# Patient Record
Sex: Male | Born: 1955
Health system: Southern US, Community
[De-identification: ages and names within clinical notes are randomized; demographics above are authoritative.]

## PROBLEM LIST (undated history)

## (undated) DIAGNOSIS — R002 Palpitations: Secondary | ICD-10-CM

## (undated) DIAGNOSIS — I2510 Atherosclerotic heart disease of native coronary artery without angina pectoris: Secondary | ICD-10-CM

## (undated) DIAGNOSIS — R1032 Left lower quadrant pain: Secondary | ICD-10-CM

## (undated) DIAGNOSIS — M545 Low back pain, unspecified: Secondary | ICD-10-CM

## (undated) DIAGNOSIS — M1990 Unspecified osteoarthritis, unspecified site: Secondary | ICD-10-CM

## (undated) DIAGNOSIS — Z9889 Other specified postprocedural states: Secondary | ICD-10-CM

## (undated) DIAGNOSIS — Z87442 Personal history of urinary calculi: Secondary | ICD-10-CM

## (undated) DIAGNOSIS — T7840XA Allergy, unspecified, initial encounter: Secondary | ICD-10-CM

## (undated) DIAGNOSIS — E7800 Pure hypercholesterolemia, unspecified: Secondary | ICD-10-CM

## (undated) DIAGNOSIS — R972 Elevated prostate specific antigen [PSA]: Secondary | ICD-10-CM

## (undated) DIAGNOSIS — N400 Enlarged prostate without lower urinary tract symptoms: Secondary | ICD-10-CM

## (undated) DIAGNOSIS — R112 Nausea with vomiting, unspecified: Secondary | ICD-10-CM

## (undated) HISTORY — DX: Elevated prostate specific antigen (PSA): R97.20

## (undated) HISTORY — PX: COLONOSCOPY: SHX174

## (undated) HISTORY — DX: Allergy, unspecified, initial encounter: T78.40XA

## (undated) HISTORY — DX: Pure hypercholesterolemia, unspecified: E78.00

## (undated) HISTORY — DX: Unspecified osteoarthritis, unspecified site: M19.90

## (undated) HISTORY — PX: KNEE ARTHROSCOPY: SUR90

## (undated) HISTORY — DX: Palpitations: R00.2

## (undated) HISTORY — DX: Left lower quadrant pain: R10.32

## (undated) HISTORY — DX: Low back pain, unspecified: M54.50

## (undated) HISTORY — DX: Atherosclerotic heart disease of native coronary artery without angina pectoris: I25.10

## (undated) HISTORY — DX: Low back pain: M54.5

---

## 2001-09-18 HISTORY — PX: RECTAL SURGERY: SHX760

## 2001-09-18 HISTORY — PX: LAMINECTOMY AND MICRODISCECTOMY LUMBAR SPINE: SHX1913

## 2002-02-01 ENCOUNTER — Observation Stay (HOSPITAL_COMMUNITY): Admission: AD | Admit: 2002-02-01 | Discharge: 2002-02-01 | Payer: Self-pay

## 2002-09-08 ENCOUNTER — Observation Stay (HOSPITAL_COMMUNITY): Admission: RE | Admit: 2002-09-08 | Discharge: 2002-09-09 | Payer: Self-pay | Admitting: Neurosurgery

## 2002-09-08 ENCOUNTER — Encounter: Payer: Self-pay | Admitting: Neurosurgery

## 2004-10-04 ENCOUNTER — Ambulatory Visit: Payer: Self-pay | Admitting: Pulmonary Disease

## 2005-10-12 ENCOUNTER — Ambulatory Visit: Payer: Self-pay | Admitting: Pulmonary Disease

## 2005-10-31 ENCOUNTER — Ambulatory Visit: Payer: Self-pay | Admitting: Pulmonary Disease

## 2006-12-06 ENCOUNTER — Ambulatory Visit: Payer: Self-pay | Admitting: Pulmonary Disease

## 2006-12-06 LAB — CONVERTED CEMR LAB
ALT: 27 units/L (ref 0–40)
AST: 26 units/L (ref 0–37)
Albumin: 4.1 g/dL (ref 3.5–5.2)
Alkaline Phosphatase: 98 units/L (ref 39–117)
BUN: 21 mg/dL (ref 6–23)
Basophils Absolute: 0 10*3/uL (ref 0.0–0.1)
Basophils Relative: 0.7 % (ref 0.0–1.0)
Bilirubin, Direct: 0.2 mg/dL (ref 0.0–0.3)
CO2: 31 meq/L (ref 19–32)
Calcium: 9.1 mg/dL (ref 8.4–10.5)
Chloride: 101 meq/L (ref 96–112)
Cholesterol: 152 mg/dL (ref 0–200)
Creatinine, Ser: 1.1 mg/dL (ref 0.4–1.5)
Eosinophils Absolute: 0.1 10*3/uL (ref 0.0–0.6)
Eosinophils Relative: 1.1 % (ref 0.0–5.0)
GFR calc Af Amer: 91 mL/min
GFR calc non Af Amer: 75 mL/min
Glucose, Bld: 104 mg/dL — ABNORMAL HIGH (ref 70–99)
HCT: 45.1 % (ref 39.0–52.0)
HDL: 39.6 mg/dL (ref >39.0)
Hemoglobin: 15.9 g/dL (ref 13.0–17.0)
LDL Cholesterol: 93 mg/dL (ref 0–99)
Lymphocytes Relative: 20.8 % (ref 12.0–46.0)
MCHC: 35.3 g/dL (ref 30.0–36.0)
MCV: 83.7 fL (ref 78.0–100.0)
Monocytes Absolute: 0.5 10*3/uL (ref 0.2–0.7)
Monocytes Relative: 10 % (ref 3.0–11.0)
Neutro Abs: 3.7 10*3/uL (ref 1.4–7.7)
Neutrophils Relative %: 67.4 % (ref 43.0–77.0)
PSA: 0.63 ng/mL (ref 0.10–4.00)
Platelets: 206 10*3/uL (ref 150–400)
Potassium: 4.3 meq/L (ref 3.5–5.1)
RBC: 5.39 M/uL (ref 4.22–5.81)
RDW: 12.8 % (ref 11.5–14.6)
Sodium: 137 meq/L (ref 135–145)
TSH: 1.97 microintl units/mL (ref 0.35–5.50)
Total Bilirubin: 1.1 mg/dL (ref 0.3–1.2)
Total CHOL/HDL Ratio: 3.8
Total Protein: 7 g/dL (ref 6.0–8.3)
Triglycerides: 99 mg/dL (ref 0–149)
VLDL: 20 mg/dL (ref 0–40)
WBC: 5.4 10*3/uL (ref 4.5–10.5)

## 2007-11-15 DIAGNOSIS — T7840XA Allergy, unspecified, initial encounter: Secondary | ICD-10-CM | POA: Insufficient documentation

## 2007-12-19 ENCOUNTER — Ambulatory Visit: Payer: Self-pay | Admitting: Pulmonary Disease

## 2007-12-19 DIAGNOSIS — R002 Palpitations: Secondary | ICD-10-CM | POA: Insufficient documentation

## 2007-12-19 DIAGNOSIS — M1990 Unspecified osteoarthritis, unspecified site: Secondary | ICD-10-CM | POA: Insufficient documentation

## 2007-12-19 DIAGNOSIS — M545 Low back pain, unspecified: Secondary | ICD-10-CM | POA: Insufficient documentation

## 2007-12-28 LAB — CONVERTED CEMR LAB
ALT: 34 units/L (ref 0–53)
AST: 24 units/L (ref 0–37)
Albumin: 4.1 g/dL (ref 3.5–5.2)
Alkaline Phosphatase: 83 units/L (ref 39–117)
BUN: 14 mg/dL (ref 6–23)
Bacteria, UA: NEGATIVE
Basophils Absolute: 0 10*3/uL (ref 0.0–0.1)
Basophils Relative: 0.3 % (ref 0.0–1.0)
Bilirubin Urine: NEGATIVE
Bilirubin, Direct: 0.2 mg/dL (ref 0.0–0.3)
CO2: 32 meq/L (ref 19–32)
Calcium: 9.6 mg/dL (ref 8.4–10.5)
Chloride: 100 meq/L (ref 96–112)
Cholesterol: 184 mg/dL (ref 0–200)
Creatinine, Ser: 1.1 mg/dL (ref 0.4–1.5)
Crystals: NEGATIVE
Direct LDL: 107.7 mg/dL
Eosinophils Absolute: 0.1 10*3/uL (ref 0.0–0.7)
Eosinophils Relative: 1.8 % (ref 0.0–5.0)
GFR calc Af Amer: 90 mL/min
GFR calc non Af Amer: 75 mL/min
Glucose, Bld: 109 mg/dL — ABNORMAL HIGH (ref 70–99)
HCT: 48.8 % (ref 39.0–52.0)
HDL: 37.7 mg/dL — ABNORMAL LOW (ref >39.0)
Hemoglobin, Urine: NEGATIVE
Hemoglobin: 16.4 g/dL (ref 13.0–17.0)
Ketones, ur: NEGATIVE mg/dL
Leukocytes, UA: NEGATIVE
Lymphocytes Relative: 18.6 % (ref 12.0–46.0)
MCHC: 33.6 g/dL (ref 30.0–36.0)
MCV: 85 fL (ref 78.0–100.0)
Monocytes Absolute: 0.5 10*3/uL (ref 0.1–1.0)
Monocytes Relative: 7.2 % (ref 3.0–12.0)
Neutro Abs: 4.5 10*3/uL (ref 1.4–7.7)
Neutrophils Relative %: 72.1 % (ref 43.0–77.0)
Nitrite: NEGATIVE
PSA: 0.65 ng/mL (ref 0.10–4.00)
Platelets: 206 10*3/uL (ref 150–400)
Potassium: 3.9 meq/L (ref 3.5–5.1)
RBC: 5.74 M/uL (ref 4.22–5.81)
RDW: 13 % (ref 11.5–14.6)
Sodium: 139 meq/L (ref 135–145)
Specific Gravity, Urine: 1.02 (ref 1.000–1.03)
Squamous Epithelial / HPF: NEGATIVE /lpf
TSH: 2.17 microintl units/mL (ref 0.35–5.50)
Total Bilirubin: 1.3 mg/dL — ABNORMAL HIGH (ref 0.3–1.2)
Total CHOL/HDL Ratio: 4.9
Total Protein, Urine: NEGATIVE mg/dL
Total Protein: 7.3 g/dL (ref 6.0–8.3)
Triglycerides: 245 mg/dL (ref 0–149)
Urine Glucose: NEGATIVE mg/dL
Urobilinogen, UA: 0.2 (ref 0.0–1.0)
VLDL: 49 mg/dL — ABNORMAL HIGH (ref 0–40)
WBC: 6.3 10*3/uL (ref 4.5–10.5)
pH: 5.5 (ref 5.0–8.0)

## 2008-06-24 ENCOUNTER — Telehealth: Payer: Self-pay | Admitting: Pulmonary Disease

## 2008-06-25 ENCOUNTER — Ambulatory Visit: Payer: Self-pay | Admitting: Pulmonary Disease

## 2008-08-26 ENCOUNTER — Telehealth: Payer: Self-pay | Admitting: Pulmonary Disease

## 2008-08-26 LAB — CONVERTED CEMR LAB
ALT: 24 units/L (ref 0–53)
AST: 22 units/L (ref 0–37)
Albumin: 3.8 g/dL (ref 3.5–5.2)
Alkaline Phosphatase: 91 units/L (ref 39–117)
Bilirubin, Direct: 0.2 mg/dL (ref 0.0–0.3)
Cholesterol: 146 mg/dL (ref 0–200)
HDL: 31.4 mg/dL — ABNORMAL LOW (ref >39.0)
LDL Cholesterol: 85 mg/dL (ref 0–99)
Total Bilirubin: 0.9 mg/dL (ref 0.3–1.2)
Total CHOL/HDL Ratio: 4.6
Total Protein: 6.8 g/dL (ref 6.0–8.3)
Triglycerides: 148 mg/dL (ref 0–149)
VLDL: 30 mg/dL (ref 0–40)

## 2008-12-07 ENCOUNTER — Ambulatory Visit: Payer: Self-pay | Admitting: Pulmonary Disease

## 2008-12-07 LAB — CONVERTED CEMR LAB
Bilirubin Urine: NEGATIVE
Hemoglobin, Urine: NEGATIVE
Ketones, ur: NEGATIVE mg/dL
Leukocytes, UA: NEGATIVE
Nitrite: NEGATIVE
Specific Gravity, Urine: 1.02 (ref 1.000–1.030)
Total Protein, Urine: NEGATIVE mg/dL
Urine Glucose: NEGATIVE mg/dL
Urobilinogen, UA: 0.2 (ref 0.0–1.0)
pH: 5.5 (ref 5.0–8.0)

## 2008-12-22 ENCOUNTER — Ambulatory Visit: Payer: Self-pay | Admitting: Pulmonary Disease

## 2008-12-22 DIAGNOSIS — M109 Gout, unspecified: Secondary | ICD-10-CM | POA: Insufficient documentation

## 2008-12-22 LAB — CONVERTED CEMR LAB
ALT: 33 units/L (ref 0–53)
AST: 26 units/L (ref 0–37)
Albumin: 3.9 g/dL (ref 3.5–5.2)
Alkaline Phosphatase: 75 units/L (ref 39–117)
BUN: 17 mg/dL (ref 6–23)
Basophils Absolute: 0.1 10*3/uL (ref 0.0–0.1)
Basophils Relative: 2.3 % (ref 0.0–3.0)
Bilirubin, Direct: 0.2 mg/dL (ref 0.0–0.3)
CO2: 31 meq/L (ref 19–32)
Calcium: 8.9 mg/dL (ref 8.4–10.5)
Chloride: 104 meq/L (ref 96–112)
Cholesterol: 145 mg/dL (ref 0–200)
Creatinine, Ser: 1.2 mg/dL (ref 0.4–1.5)
Eosinophils Absolute: 0.1 10*3/uL (ref 0.0–0.7)
Eosinophils Relative: 2.4 % (ref 0.0–5.0)
GFR calc non Af Amer: 67.29 mL/min (ref >60)
Glucose, Bld: 112 mg/dL — ABNORMAL HIGH (ref 70–99)
HCT: 45.4 % (ref 39.0–52.0)
HDL: 33.6 mg/dL — ABNORMAL LOW (ref >39.00)
Hemoglobin: 16.2 g/dL (ref 13.0–17.0)
LDL Cholesterol: 87 mg/dL (ref 0–99)
Lymphocytes Relative: 19.3 % (ref 12.0–46.0)
Lymphs Abs: 1.1 10*3/uL (ref 0.7–4.0)
MCHC: 35.6 g/dL (ref 30.0–36.0)
MCV: 84.8 fL (ref 78.0–100.0)
Monocytes Absolute: 0.5 10*3/uL (ref 0.1–1.0)
Monocytes Relative: 8.1 % (ref 3.0–12.0)
Neutro Abs: 3.8 10*3/uL (ref 1.4–7.7)
Neutrophils Relative %: 67.9 % (ref 43.0–77.0)
PSA: 0.71 ng/mL (ref 0.10–4.00)
Platelets: 155 10*3/uL (ref 150.0–400.0)
Potassium: 4.5 meq/L (ref 3.5–5.1)
RBC: 5.35 M/uL (ref 4.22–5.81)
RDW: 12.4 % (ref 11.5–14.6)
Sodium: 140 meq/L (ref 135–145)
TSH: 1.31 microintl units/mL (ref 0.35–5.50)
Total Bilirubin: 1.3 mg/dL — ABNORMAL HIGH (ref 0.3–1.2)
Total CHOL/HDL Ratio: 4
Total Protein: 6.4 g/dL (ref 6.0–8.3)
Triglycerides: 122 mg/dL (ref 0.0–149.0)
VLDL: 24.4 mg/dL (ref 0.0–40.0)
WBC: 5.6 10*3/uL (ref 4.5–10.5)

## 2008-12-23 LAB — CONVERTED CEMR LAB: Uric Acid, Serum: 8.5 mg/dL — ABNORMAL HIGH (ref 4.0–7.8)

## 2009-02-08 ENCOUNTER — Telehealth (INDEPENDENT_AMBULATORY_CARE_PROVIDER_SITE_OTHER): Payer: Self-pay | Admitting: *Deleted

## 2009-11-30 ENCOUNTER — Ambulatory Visit: Payer: Self-pay | Admitting: Pulmonary Disease

## 2009-11-30 DIAGNOSIS — R1032 Left lower quadrant pain: Secondary | ICD-10-CM | POA: Insufficient documentation

## 2009-12-01 ENCOUNTER — Ambulatory Visit: Payer: Self-pay | Admitting: Internal Medicine

## 2009-12-01 ENCOUNTER — Telehealth (INDEPENDENT_AMBULATORY_CARE_PROVIDER_SITE_OTHER): Payer: Self-pay | Admitting: *Deleted

## 2009-12-01 LAB — CONVERTED CEMR LAB
ALT: 23 units/L (ref 0–53)
AST: 22 units/L (ref 0–37)
Alkaline Phosphatase: 83 units/L (ref 39–117)
Basophils Absolute: 0 10*3/uL (ref 0.0–0.1)
Bilirubin, Direct: 0.1 mg/dL (ref 0.0–0.3)
CO2: 33 meq/L — ABNORMAL HIGH (ref 19–32)
Chloride: 106 meq/L (ref 96–112)
Creatinine, Ser: 1.1 mg/dL (ref 0.4–1.5)
Hemoglobin: 15.3 g/dL (ref 13.0–17.0)
Lymphocytes Relative: 23.7 % (ref 12.0–46.0)
Monocytes Relative: 9 % (ref 3.0–12.0)
Neutro Abs: 3 10*3/uL (ref 1.4–7.7)
Potassium: 4.2 meq/L (ref 3.5–5.1)
RBC: 5.17 M/uL (ref 4.22–5.81)
RDW: 13.1 % (ref 11.5–14.6)
Total Protein: 6.5 g/dL (ref 6.0–8.3)

## 2009-12-07 ENCOUNTER — Encounter: Payer: Self-pay | Admitting: Pulmonary Disease

## 2009-12-17 ENCOUNTER — Telehealth (INDEPENDENT_AMBULATORY_CARE_PROVIDER_SITE_OTHER): Payer: Self-pay | Admitting: *Deleted

## 2009-12-20 ENCOUNTER — Ambulatory Visit: Payer: Self-pay | Admitting: Pulmonary Disease

## 2009-12-22 ENCOUNTER — Ambulatory Visit: Payer: Self-pay | Admitting: Pulmonary Disease

## 2009-12-22 DIAGNOSIS — R972 Elevated prostate specific antigen [PSA]: Secondary | ICD-10-CM | POA: Insufficient documentation

## 2009-12-22 LAB — CONVERTED CEMR LAB
AST: 19 units/L (ref 0–37)
Alkaline Phosphatase: 89 units/L (ref 39–117)
BUN: 15 mg/dL (ref 6–23)
Basophils Absolute: 0 10*3/uL (ref 0.0–0.1)
Calcium: 9.4 mg/dL (ref 8.4–10.5)
Cholesterol: 128 mg/dL (ref 0–200)
Creatinine, Ser: 1.1 mg/dL (ref 0.4–1.5)
Eosinophils Absolute: 0.2 10*3/uL (ref 0.0–0.7)
GFR calc non Af Amer: 74.1 mL/min (ref >60)
Glucose, Bld: 96 mg/dL (ref 70–99)
Hemoglobin, Urine: NEGATIVE
Lymphocytes Relative: 20.6 % (ref 12.0–46.0)
MCHC: 34.8 g/dL (ref 30.0–36.0)
Monocytes Relative: 9 % (ref 3.0–12.0)
Neutrophils Relative %: 66.6 % (ref 43.0–77.0)
Nitrite: NEGATIVE
Platelets: 201 10*3/uL (ref 150.0–400.0)
RDW: 13.3 % (ref 11.5–14.6)
Sodium: 143 meq/L (ref 135–145)
Specific Gravity, Urine: 1.025 (ref 1.000–1.030)
Total Bilirubin: 0.6 mg/dL (ref 0.3–1.2)
Total CHOL/HDL Ratio: 4
Total Protein, Urine: NEGATIVE mg/dL
Triglycerides: 299 mg/dL — ABNORMAL HIGH (ref 0.0–149.0)
Urine Glucose: NEGATIVE mg/dL
Urobilinogen, UA: 0.2 (ref 0.0–1.0)
VLDL: 59.8 mg/dL — ABNORMAL HIGH (ref 0.0–40.0)

## 2010-01-19 ENCOUNTER — Telehealth (INDEPENDENT_AMBULATORY_CARE_PROVIDER_SITE_OTHER): Payer: Self-pay | Admitting: *Deleted

## 2010-01-20 ENCOUNTER — Ambulatory Visit: Payer: Self-pay | Admitting: Pulmonary Disease

## 2010-02-24 ENCOUNTER — Telehealth (INDEPENDENT_AMBULATORY_CARE_PROVIDER_SITE_OTHER): Payer: Self-pay | Admitting: *Deleted

## 2010-02-28 ENCOUNTER — Telehealth (INDEPENDENT_AMBULATORY_CARE_PROVIDER_SITE_OTHER): Payer: Self-pay | Admitting: *Deleted

## 2010-10-18 NOTE — Assessment & Plan Note (Signed)
Summary: CPX/ MBW   CC:  Follow up visit & yearly CPX....  History of Present Illness: 55 y/o WM here for a follow up visit & CPX...   ~  Apr10:  he has been doing well and has no new complaints or concerns today... he notes that he was dx w/ an early stage cataract by his optomitrist- DrMiller.... he was also seen at [facility] 2/10 w/ "gout"- he had pain in left heel area, not needled, lab work done and told Uric was elevated... given Colchicine (developed sl diarrhea), & Hydrocodone & symptoms resolved- no recurrence... we discussed re-checking Urate level (8.5) and consider Allopurinol (he declines & wants to manage w/ low purine diet)...   ~  November 30, 2009:  he states that he had a GI bug  ~1 month ago w/ n/v/d and some discomfort in the LLQ area... the n/v/d resolved spont, but the LLQ pain has persisited- a steady daily continuous dull pain- worse w/ cough, no relation to BM's which have been back to normal, no blood seen, no f/c/s etc... he denies bulging lump in groin- no hx hernia etc... he had prev rectal surg 2003 by [facility] & a neg screening colonoscopy 2008 by [facility] (min divertics only)...  **exam is neg x min discomfort on deep palp in LLQ, no hernia detected; we discussed eval w/ CT Abd&Pelvis (essentially neg), check labs (all WNL w/ sed=7), ?refer to GI (saw [facility] 3/11- prob musculoskeletal pain, ?gastroenteritis)...    ~  December 22, 2009:  GI symptoms diminished over the last month, back to  ~baseline... otherw feeling well- no new complaints or concerns x Insomnia & would like to try AMBIEN Prn...  he had labs done 12/20/09 and review shows incr PSA to 4.96 (it was 0.71 last yr)... he specif denies any prostate manipulation, dysuria, pain, LTOS, etc... **offered referral to Urology now, vs trial Antibiotic for several weeks & repeat PSA in 7mo (he prefers the latter)...    Current Problems:   ALLERGY (ICD-995.3) - uses OTC allergy meds as needed + "netti pot"... he had pos  testing by [facility] in 1999 w/ sens to pollen, trees, weeds... he has received Depo shots for severe spring allergy symptoms in the past.  Hx of PALPITATIONS (ICD-785.1) - cardiac eval by DrKatz in 2004 was neg.  ~  baseline EKG w/ NSR, WNL...   ~  NuclearStressTest 11/04 was neg for scar or ischemia, EF=56%...  HYPERCHOLESTEROLEMIA (ICD-272.0) - on ZOCOR 40mg - 1/2 tab daily... TRICOR was added to his regimen in 2007 but he stopped this on his own...   ~  FLP 3/08 on Simva20/ Tricor showed TChol 152, TG 99 (down from 444), HDL 40, LDL 93... same.  ~  FLP 4/09 on Simva20 showed TChol 184, TG 245, HDL 38, LDL 108... he didn't want to restart Fibrate.  ~  FLP 3/10 on Simva20 showed TChol 145, TG 122, HDL 34, LDL 87... improved.  ~  FLP 4/11 on Simva20 showed TChol 128, TG 299, HDL 34, LDL 54... needs better low fat diet.  ABDOMINAL PAIN, LEFT LOWER QUADRANT (ICD-789.04) - **see eval 3/11**  ELEVATED PROSTATE SPECIFIC ANTIGEN (ICD-790.93) - **see plan 4/11**  ~  labs 3/10 showed PSA= 0.71  ~  labs 4/11 showed PSA= 4.96 & DRE is benign- normal feeling gland... he opted for trial antibiotic Rx & repeat lab.  DEGENERATIVE JOINT DISEASE (ICD-715.90) - he is s/p arthroscopic surg both knees by DrWainer  GOUT (ICD-274.9) - **  see above ** Uric acid level 4/10 = 8.5 & pt was offered Allopurinol Rx, but he preferred to control this on a low purine diet... no recurrent acute arthritic problems or gout attacks so far...  Hx of BACK PAIN, LUMBAR (ICD-724.2) - he takes Advil OTC 3-4 Qod or so... he is s/p L4-5 lumbar laminotomy & microdiscectomy 2003 by DrNudelman...  HEALTH MAINTENANCE:    ~  GI = DrMedoff w/ flex sig 7/93 = neg; & colonoscopy 4/08 showing rare divertics, no polyps...   ~  GU = 4/11 OV w/ norm DRE & sudden rise in PSA to 4.96... trial Doxy & f/u PSA 47mo- may need bx.  ~  Immunizations:  ?last Tetanus shot;  he has not been inclined to get the seasonal flu vaccines.   Allergies: 1)  !  Penicillin  Comments:  Nurse/Medical Assistant: The patient's medications and allergies were reviewed with the patient and were updated in the Medication and Allergy Lists.  Past History:  Past Medical History:  ALLERGY (ICD-995.3) Hx of PALPITATIONS (ICD-785.1) HYPERCHOLESTEROLEMIA (ICD-272.0) ABDOMINAL PAIN, LEFT LOWER QUADRANT (ICD-789.04) ELEVATED PROSTATE SPECIFIC ANTIGEN (ICD-790.93) DEGENERATIVE JOINT DISEASE (ICD-715.90) GOUT (ICD-274.9) Hx of BACK PAIN, LUMBAR (ICD-724.2)  Past Surgical History: S/P right knee arthroscopy by DrWainer S/P right L4-5 lumbar laminotomy w/ microdiscectomy 2003 DrNudelman S/P rectal surgery in 2003 by DrBowman  Family History: Reviewed history from 12/22/2008 and no changes required. Father alive age 58 w/ hx CAD/ stent, Chol, DVT after ankle injury. Mother alive age 35 w/ DJD & bilat TKR's, borderline DM. 1 Brother in good health  Social History: Reviewed history from 12/22/2008 and no changes required. Re-married, 15 yrs, wife= [place] 1 child from 1st marriage, 2 step-children ex-smoker (smoked 1/2-1 ppd, ages 12-25) social alcohol jewelry appraiser  Review of Systems  The patient denies fever, chills, sweats, anorexia, fatigue, weakness, malaise, weight loss, sleep disorder, blurring, diplopia, eye irritation, eye discharge, vision loss, eye pain, photophobia, earache, ear discharge, tinnitus, decreased hearing, nasal congestion, nosebleeds, sore throat, hoarseness, chest pain, palpitations, syncope, dyspnea on exertion, orthopnea, PND, peripheral edema, cough, dyspnea at rest, excessive sputum, hemoptysis, wheezing, pleurisy, nausea, vomiting, diarrhea, constipation, change in bowel habits, abdominal pain, melena, hematochezia, jaundice, gas/bloating, indigestion/heartburn, dysphagia, odynophagia, dysuria, hematuria, urinary frequency, urinary hesitancy, nocturia, incontinence, back pain, joint pain, joint swelling, muscle cramps,  muscle weakness, stiffness, arthritis, sciatica, restless legs, leg pain at night, leg pain with exertion, rash, itching, dryness, suspicious lesions, paralysis, paresthesias, seizures, tremors, vertigo, transient blindness, frequent falls, frequent headaches, difficulty walking, depression, anxiety, memory loss, confusion, cold intolerance, heat intolerance, polydipsia, polyphagia, polyuria, unusual weight change, abnormal bruising, bleeding, enlarged lymph nodes, urticaria, allergic rash, hay fever, and recurrent infections.    Vital Signs:  Patient profile:   55 year old male Height:      68 inches Weight:      195.50 pounds BMI:     29.83 O2 Sat:      98 % on Room air Temp:     98.2 degrees F oral Pulse rate:   82 / minute BP sitting:   122 / 78  (left arm) Cuff size:   regular  Vitals Entered By: Randell Loop CMA (December 22, 2009 1:56 PM)  O2 Sat at Rest %:  98 O2 Flow:  Room air CC: Follow up visit & yearly CPX... Is Patient Diabetic? No Pain Assessment Patient in pain? no      Comments meds updated today   Physical Exam  Additional  Exam:  WD, WN, 55 y/o WM in NAD... GENERAL:  Alert & oriented; pleasant & cooperative... HEENT:  [place]/AT, EOM-wnl, PERRLA, Fundi-benign, EACs-clear, TMs-wnl, NOSE-clear, THROAT-clear & wnl. NECK:  Supple w/ full ROM; no JVD; normal carotid impulses w/o bruits; no thyromegaly or nodules palpated; no lymphadenopathy. CHEST:  Clear to P & A; without wheezes/ rales/ or rhonchi. HEART:  Regular Rhythm; without murmurs/ rubs/ or gallops. ABDOMEN:  Soft & nontender; normal bowel sounds; no organomegaly or masses detected; no hernia palpated. RECTAL:  decr sphincter tone (prev surg), prostate feels normal= 2+size, no nodules; stool heme neg... EXT: without deformities or arthritic changes; no varicose veins/ venous insuffic/ or edema. NEURO:  CN's intact; motor testing normal; sensory testing normal; gait normal & balance OK. DERM:  No lesions noted; no  rash etc...    EKG  Procedure date:  12/22/2009  Findings:      Normal sinus rhythm with rate of:  70/min... Tracing shows sl incr voltage, no acute STTWA's...  SN   MISC. Report  Procedure date:  12/20/2009  Findings:      BMP (METABOL)   Sodium                    143 mEq/L                   135-145   Potassium                 4.1 mEq/L                   3.5-5.1   Chloride                  105 mEq/L                   96-112   Carbon Dioxide            30 mEq/L                    19-32   Glucose                   96 mg/dL                    16-10   BUN                       15 mg/dL                    9-60   Creatinine                1.1 mg/dL                   4.5-4.0   Calcium                   9.4 mg/dL                   9.8-11.9   GFR                       74.10 mL/min                >60  CBC Platelet w/Diff (CBCD)   White Cell Count          6.3 K/uL  4.5-10.5   Red Cell Count            5.26 Mil/uL                 4.22-5.81   Hemoglobin                15.5 g/dL                   84.6-96.2   Hematocrit                44.5 %                      39.0-52.0   MCV                       84.7 fl                     78.0-100.0   Platelet Count            201.0 K/uL                  150.0-400.0   Neutrophil %              66.6 %                      43.0-77.0   Lymphocyte %              20.6 %                      12.0-46.0   Monocyte %                9.0 %                       3.0-12.0   Eosinophils%              3.4 %                       0.0-5.0   Basophils %               0.4 %                       0.0-3.0   Hepatic/Liver Function Panel (HEPATIC)   Total Bilirubin           0.6 mg/dL                   9.5-2.8   Direct Bilirubin          0.1 mg/dL                   4.1-3.2   Alkaline Phosphatase      89 U/L                      39-117   AST                       19 U/L                      0-37   ALT                       23 U/L  0-53   Total Protein             6.9 g/dL                    1.6-1.0   Albumin                   3.9 g/dL                    9.6-0.4  TSH (TSH)   FastTSH                   1.92 uIU/mL                 0.35-5.50  Comments:      Prostate Specific Antigen (PSA)   PSA-Hyb              [H]  4.96 ng/mL                  0.10-4.00   UDip Only (UDIP)   Color                     LT. YELLOW   Clarity                   CLEAR                       Clear   Specific Gravity          1.025                       1.000 - 1.030   Urine Ph                  6.5                         5.0-8.0   Protein                   NEGATIVE                    Negative   Urine Glucose             NEGATIVE                    Negative   Ketones                   NEGATIVE                    Negative   Urine Bilirubin           NEGATIVE                    Negative   Blood                     NEGATIVE                    Negative   Urobilinogen              0.2                         0.0 - 1.0   Leukocyte Esterace        NEGATIVE  Negative   Nitrite                   NEGATIVE                    Negative   Lipid Panel (LIPID)   Cholesterol               128 mg/dL                   0-454   Triglycerides        [H]  299.0 mg/dL                 0.9-811.9   HDL                  [L]  14.78 mg/dL                 >29.56  Cholesterol LDL - Direct                             54.2 mg/dL   Impression & Recommendations:  Problem # 1:  ELEVATED PROSTATE SPECIFIC ANTIGEN (ICD-790.93) Surprize finding w/ elevated PSA to 4.96... no GU or prostate symptoms... last yr PSA= 0.71 for a huge jump & incr in PSA "velocity"... exam is neg w/ benign feeling gland... offered option> GU consult now vs trial antibiotic & f/u PSA in 1 mo (he prefers the latter... RX:  Doxy 100mg  Bid x 2weeks, and recheck PSA 1 month...  Problem # 2:  ALLERGY (ICD-995.3) He is OK w/ OTC Rx...  Problem # 3:  HYPERCHOLESTEROLEMIA  (ICD-272.0) Chol looks good on the Simva20, but TG's seem to be intermittently elevated... we discussed low chol/ LOW FAT diet & incr exercise... he is hoping to avoid addition of a fibrate... His updated medication list for this problem includes:    Zocor 20 Mg Tabs (Simvastatin) .Marland Kitchen... Take 1 tablet by mouth once a day  Problem # 4:  ABDOMINAL PAIN, LEFT LOWER QUADRANT (ICD-789.04) DrMedoff felt this was musculoskeletal [pain... improved w/ time & Tramadol...  Problem # 5:  DEGENERATIVE JOINT DISEASE (ICD-715.90) He is stable>  it isn't at all clear if he truly had gout attack in past... no recurrence on his "low purine" diet. His updated medication list for this problem includes:    Adult Aspirin Ec Low Strength 81 Mg Tbec (Aspirin) .Marland Kitchen... Take 1 tablet by mouth once a day  Problem # 6:  OTHER MEDICAL PROBLEMS AS NOTED>>>  Complete Medication List: 1)  Adult Aspirin Ec Low Strength 81 Mg Tbec (Aspirin) .... Take 1 tablet by mouth once a day 2)  Zocor 20 Mg Tabs (Simvastatin) .... Take 1 tablet by mouth once a day 3)  Multivitamins Tabs (Multiple vitamin) .... Take 1 tablet by mouth once a day 4)  Doxycycline Hyclate 100 Mg Caps (Doxycycline hyclate) .... Take 1 cap by mouth two times a day til gone...  Other Orders: Prescription Created Electronically [phone])  Patient Instructions: 1)  Today we updated your med list- see below.... 2)  We refilled your ZOCOR 20mg  for 2011...  3)  For your TRIGLYCERIDES:  low fat diet- avoid animal fats & saturated fats... we may need to consider a Fibrate medication but diet + exercise are better for this problem... 4)  Your PSA was sl elevated today - ?etiology (the gland is normal on exam)... we will try an  antibiotic for the next 2 weeks then repeat your PSA blood test in one month...  if still elevated we will proceed w/ Urologic evaluation.Marland KitchenMarland Kitchen 5)  Please call us at [phone] in early May when you are ready for the follow up  PSA... Prescriptions: DOXYCYCLINE HYCLATE 100 MG CAPS (DOXYCYCLINE HYCLATE) take 1 cap by mouth two times a day til gone...  #28 x 0   Entered and Authorized by:   Michele Mcalpine MD   Signed by:   Michele Mcalpine MD on 12/22/2009   Method used:   Print then Give to Patient   RxID:   [id number] ZOCOR 20 MG  TABS (SIMVASTATIN) Take 1 tablet by mouth once a day  #90 x prn   Entered and Authorized by:   Michele Mcalpine MD   Signed by:   Michele Mcalpine MD on 12/22/2009   Method used:   Print then Give to Patient   RxID:   [id number]    CardioPerfect ECG  ID: [id number] Patient: Dwayne Larsen A DOB: 01/28/1956 Age: 55 Years Old Sex: Male Race: White Physician: scott nadel Technician: Randell Loop CMA Height: 68 Weight: 195.50 Status: Unconfirmed Past Medical History:  ALLERGY (ICD-995.3) Hx of PALPITATIONS (ICD-785.1) HYPERCHOLESTEROLEMIA (ICD-272.0) DEGENERATIVE JOINT DISEASE (ICD-715.90) GOUT (ICD-274.9) Hx of BACK PAIN, LUMBAR (ICD-724.2)   Recorded: 12/22/2009 2:11 PM P/PR: 117 ms / 145 ms - Heart rate (maximum exercise) QRS: 88 QT/QTc/QTd: 383 ms / 402 ms / 28 ms - Heart rate (maximum exercise)  P/QRS/T axis: 63 deg / 67 deg / 58 deg - Heart rate (maximum exercise)  Heartrate: 71 bpm  Interpretation:  Normal sinus rhythm with rate of:  70/min... Tracing shows sl incr voltage, no acute STTWA's...  SN

## 2010-10-18 NOTE — Progress Notes (Signed)
Summary: simvastatin refill   Phone Note Call from Patient   Caller: Patient Call For: nadel Summary of Call: pt needs refill of simvastatin. says pharm told him to call here as he had a rx# that was old/ expired. [facility] [address] pt [facility] [id number] Initial call taken by: Tivis Ringer, CNA,  February 28, 2010 9:16 AM  Follow-up for Phone Call        last seen by SN 12-22-09.  rx sent to [facility] [address]  called informed pt rx has been sent. Follow-up by: [name] Master CNA/MA,  February 28, 2010 9:23 AM    Prescriptions: ZOCOR 20 MG  TABS (SIMVASTATIN) Take 1 tablet by mouth once a day  #90 x 6   Entered by:   [name] Master CNA/MA   Authorized by:   Michele Mcalpine MD   Signed by:   [name] Master CNA/MA on 02/28/2010   Method used:   Electronically to        [id number] (retail)       [address]       [place], [place]  [address]       Ph: [phone] or [phone]       Fax: [phone]   RxID:   [id number]

## 2010-10-18 NOTE — Letter (Signed)
[facility]   Imported By: Sherian Rein 12/28/2009 07:27:41  _____________________________________________________________________  External Attachment:    Type:   Image     Comment:   External Document

## 2010-10-18 NOTE — Progress Notes (Signed)
Summary: diarreha/ vomiting  Phone Note Call from Patient Call back at [phone]   Caller: Patient Call For: nadel Summary of Call: pt have diarrhea and vomiting [facility] [address] Initial call taken by: Rickard Patience,  December 01, 2009 3:12 PM  Follow-up for Phone Call        Pt states that after having CT Abd today, he had some diarrhea and then vomiting.  I advised to try taking immodium for diarrhea.  Pt requesting lab and CT results.  Please advise thanks Vernie Murders  December 01, 2009 3:43 PM   Additional Follow-up for Phone Call Additional follow up Details #1::        per SN--use the immodium otc 1 by mouth every 4 hours as needed for watery diarrhea and we sent into the pharmacy for phenergan 25mg   1 by mouth every 4 hours as needed for nausea---i did review his labs and ct scan results with pt and he is aware of the order placed in computer for him to follow up with Dr. Kinnie Scales for futher eval.  Randell Loop CMA  December 01, 2009 4:24 PM     New/Updated Medications: PROMETHAZINE HCL 25 MG TABS (PROMETHAZINE HCL) take one tablet by mouth every 4 hours as needed for nausea IMODIUM A-D 2 MG TABS (LOPERAMIDE HCL) take one tablet by mouth every 4 hours as needed for watery diarrhea Prescriptions: PROMETHAZINE HCL 25 MG TABS (PROMETHAZINE HCL) take one tablet by mouth every 4 hours as needed for nausea  #30 x 0   Entered by:   Randell Loop CMA   Authorized by:   Michele Mcalpine MD   Signed by:   Randell Loop CMA on 12/01/2009   Method used:   Electronically to        [id number] (ret[facility])       [address]       [place], [place]  [address]       Ph: [phone] or [phone]       Fax: [phone]   RxID:   [id number]

## 2010-10-18 NOTE — Assessment & Plan Note (Signed)
Summary: HERNIA ///kp   CC:  11 month ROV & add-on for LLQ pain... .  History of Present Illness: 55 y/o WM here for an add-on visit requested for LLQ pain of 1 mo duration...   ~  Apr10:  he has been doing well and has no new complaints or concerns today... he notes that he was dx w/ an early stage cataract by his optomitrist- DrMiller.... he was also seen at [facility] 2/10 w/ "gout"- he had pain in left heel area, not needled, lab work done and told Uric was elevated... given Colchicine (developed sl diarrhea), & Hydrocodone & symptoms resolved- no recurrence... we discussed re-checking Urate level and consider Allopurinol depending on the degree of elevation... he knows to follow a low purine/ gout diet.   ~  November 30, 2009:  he states that he had a GI bug  ~1 month ago w/ n/v/d and some discomfort in the LLQ area... the n/v/d resolved spont, but the LLQ pain has persisited- a steady daily continuous dull pain- worse w/ cough, no relation to BM's which have been back to normal, no blood seen, no f/c/s etc... he denies bulging lump in groin- no hx hernia etc... he had prev rectal surg 2003 by [facility] & a neg screening colonoscopy 2008 by [facility] (min divertics only)...  **exam is neg x min discomfort on deep palp in LLQ, no hernia detected; we discussed eval w/ CT Abd&Pelvis, check labs, ?refer to GI pending results...    Current Problems:   ALLERGY (ICD-995.3) - uses OTC alergy meds as needed + "netti pot"... he had pos testing by [facility] in 1999 w/ sens to pollen, trees, weeds... he has received Depo shots for severe spring allergy symptoms in the past.  Hx of PALPITATIONS (ICD-785.1) - cardiac eval by DrKatz in 2004 was neg.  ~  baseline EKG w/ NSR, WNL...   ~  NuclearStressTest 11/04 was neg for scar or ischemia, EF=56%...  HYPERCHOLESTEROLEMIA (ICD-272.0) - on ZOCOR 40mg - 1/2 tab daily... TRICOR was added to his regimen in 2007 but he stopped this on his own...   ~  FLP 3/08 on Simva20/ Tricor  showed TChol 152, TG 99 (down from 444), HDL 40, LDL 93... same.  ~  FLP 4/09 on Simva20 showed TChol 184, TG 245, HDL 38, LDL 108... he didn't want to restart Fibrate.  ~  FLP 3/10 on Simva20 showed TChol 145, TG 122, HDL 34, LDL 87... improved.  DEGENERATIVE JOINT DISEASE (ICD-715.90) - he is s/p arthroscopic surg both knees by DrWainer  GOUT (ICD-274.9) - ** see above ** Uric acid level 4/10 = 8.5 & pt was offered Allopurinol Rx, but he preferred to control this on a low purine diet...  Hx of BACK PAIN, LUMBAR (ICD-724.2) - he takes Advil OTC 3-4 Qod or so... he is s/p L4-5 lumbar laminotomy & microdiscectomy 2003 by DrNudelman...  HEALTH MAINTENANCE:    ~  GI = DrMedoff w/ flex sig 7/93 = neg; & colonoscopy 4/08 showing rare divertics, no polyps...   ~  GU = neg DRE here and PSA's checked yearly...  ~  Immunizations:  ?last Tetanus shot;  he has not been inclined to get the seasonal flu vaccines.   Allergies: 1)  ! Penicillin  Comments:  Nurse/Medical Assistant: The patient's medications and allergies were reviewed with the patient and were updated in the Medication and Allergy Lists.  Past History:  Past Medical History: ALLERGY (ICD-995.3) Hx of PALPITATIONS (ICD-785.1) HYPERCHOLESTEROLEMIA (ICD-272.0) DEGENERATIVE JOINT  DISEASE (ICD-715.90) GOUT (ICD-274.9) Hx of BACK PAIN, LUMBAR (ICD-724.2)  Past Surgical History: S/P right knee arthroscopy by DrWainer S/P right L4-5 lumbar laminotomy w/ microdiscectomy 2003 DrNudelman S/P rectal surgery in 2003 by DrBowman  Family History: Reviewed history from 12/22/2008 and no changes required. Father alive age 85 w/ hx CAD/ stent, Chol, DVT after ankle injury. Mother alive age 35 w/ DJD & bilat TKR's, borderline DM. 1 Brother in good health  Social History: Reviewed history from 12/22/2008 and no changes required. Re-married, 15 yrs, wife= [place] 1 child from 1st marriage, 2 step-children ex-smoker (smoked 1/2-1 ppd, ages  63-25) social alcohol jewelry appraiser  Review of Systems      See HPI       The patient complains of abdominal pain.  The patient denies anorexia, fever, weight loss, weight gain, vision loss, decreased hearing, hoarseness, chest pain, syncope, dyspnea on exertion, peripheral edema, prolonged cough, headaches, hemoptysis, melena, hematochezia, severe indigestion/heartburn, hematuria, incontinence, muscle weakness, suspicious skin lesions, transient blindness, difficulty walking, depression, unusual weight change, abnormal bleeding, enlarged lymph nodes, and angioedema.    Vital Signs:  Patient profile:   55 year old male Height:      68 inches Weight:      200.13 pounds BMI:     30.54 O2 Sat:      97 % on Room air Temp:     97.2 degrees F oral Pulse rate:   70 / minute BP sitting:   124 / 82  (left arm) Cuff size:   regular  Vitals Entered By: Randell Loop CMA (November 30, 2009 2:07 PM)  O2 Sat at Rest %:  97 O2 Flow:  Room air CC: 11 month ROV & add-on for LLQ pain...  Is Patient Diabetic? No Pain Assessment Patient in pain? yes      Comments no changes in meds   Physical Exam  Additional Exam:  WD, WN, 55 y/o WM in NAD... GENERAL:  Alert & oriented; pleasant & cooperative... HEENT:  [place]/AT, EOM-wnl, PERRLA, Fundi-benign, EACs-clear, TMs-wnl, NOSE-clear, THROAT-clear & wnl. NECK:  Supple w/ full ROM; no JVD; normal carotid impulses w/o bruits; no thyromegaly or nodules palpated; no lymphadenopathy. CHEST:  Clear to P & A; without wheezes/ rales/ or rhonchi. HEART:  Regular Rhythm; without murmurs/ rubs/ or gallops. ABDOMEN:  Soft & nontender x on deep palpation in LLQ; normal bowel sounds; no organomegaly or masses detected; no hernia palpated. EXT: without deformities or arthritic changes; no varicose veins/ venous insuffic/ or edema. NEURO:  CN's intact; motor testing normal; sensory testing normal; gait normal & balance OK. DERM:  No lesions noted; no rash  etc...    Impression & Recommendations:  Problem # 1:  ABDOMINAL PAIN, LEFT LOWER QUADRANT (ICD-789.04) ? etiology- initially sounds like he had a gastroenteritis, which resolved except the LLQ discomfort persisted & has been hurting daily... the pain isn't severe enough for pain med Rx he says, but we will proceed w/ CT Abd & Pelvis, check labs, consider GI referral pending results.  Orders: Radiology Referral (Radiology) >> for CT Abd&Pelvis... TLB-CBC Platelet - w/Differential (85025-CBCD) TLB-BMP (Basic Metabolic Panel-BMET) (80048-METABOL) TLB-Hepatic/Liver Function Pnl (80076-HEPATIC) TLB-Sedimentation Rate (ESR) (85652-ESR)  Problem # 2:  HYPERCHOLESTEROLEMIA (ICD-272.0) On Simva20 & due for f/u FLP soon... His updated medication list for this problem includes:    Zocor 20 Mg Tabs (Simvastatin) .Marland Kitchen... Take 1 tablet by mouth once a day  Problem # 3:  OTHER MEDICAL ISSUES AS NOTED>>>  Complete Medication  List: 1)  Adult Aspirin Ec Low Strength 81 Mg Tbec (Aspirin) .... Take 1 tablet by mouth once a day 2)  Zocor 20 Mg Tabs (Simvastatin) .... Take 1 tablet by mouth once a day 3)  Multivitamins Tabs (Multiple vitamin) .... Take 1 tablet by mouth once a day 4)  Promethazine Hcl 25 Mg Tabs (Promethazine hcl) .... Take one tablet by mouth every 4 hours as needed for nausea 5)  Imodium A-d 2 Mg Tabs (Loperamide hcl) .... Take one tablet by mouth every 4 hours as needed for watery diarrhea  Patient Instructions: 1)  Today we updated your med list- see below.... 2)  Today we did some follow up blood work, and we will sched a CT Scan to further evaluate your LLQ abd pain... we will call you w/ these results when avail.Marland KitchenMarland Kitchen

## 2010-10-18 NOTE — Progress Notes (Signed)
Summary: talk to nurse  Phone Note Call from Patient Call back at Work Phone [phone]   Caller: Patient Call For: nadel Summary of Call: Pt c/o gout flare-up since last night, wants something called in.//[facility] Initial call taken by: Darletta Moll,  February 24, 2010 8:58 AM  Follow-up for Phone Call        Pt states he is havign a gout flare up in his right big toe since last night and it is very painful. Requesting rx. Please advise.Carron Curie CMA  February 24, 2010 10:09 AM allergies: PCN  per sn ok to call in prednisone dosepak 10mg  6 day pack--take as directed 0 refill---pt aware Follow-up by: Philipp Deputy CMA,  February 24, 2010 10:54 AM    New/Updated Medications: PREDNISONE (PAK) 10 MG TABS (PREDNISONE) 6 day pack take as directed Prescriptions: PREDNISONE (PAK) 10 MG TABS (PREDNISONE) 6 day pack take as directed  #1 x 0   Entered by:   Philipp Deputy CMA   Authorized by:   Michele Mcalpine MD   Signed by:   Philipp Deputy CMA on 02/24/2010   Method used:   Electronically to        [id number] (retail)       [address]       [place], [place]  [address]       Ph: [phone] or [phone]       Fax: [phone]   RxID:   [id number]

## 2010-10-18 NOTE — Progress Notes (Signed)
Summary: set up labs  Phone Note Call from Patient   Caller: Patient Call For: nadel Summary of Call: pt states he is to have labs set up for PSA. [phone] Initial call taken by: Tivis Ringer, CNA,  Jan 19, 2010 1:02 PM  Follow-up for Phone Call        Marliss Czar, looks like he just wanted him to have the psa done, I just wanted to make sure there was nothing else needed before putting in idx.  Please advise, thanks!! Follow-up by: Vernie Murders,  Jan 19, 2010 2:02 PM  Additional Follow-up for Phone Call Additional follow up Details #1::        per SN---psa was 4.96 at ov on 12-20-09 we rx with doxy x 2 wks---put in lab for psa --v76.44.  labs in computer for pt and pt is aware. Randell Loop CMA  Jan 19, 2010 4:42 PM

## 2010-10-18 NOTE — Progress Notes (Signed)
Summary: put in labs  Phone Note Call from Patient Call back at Work Phone [phone]   Caller: Patient Call For: nadel Summary of Call: pt wants to make sure labs are put in so that he can come in mon 12/20/09 and have those done.  Initial call taken by: Tivis Ringer, CNA,  December 17, 2009 9:36 AM  Follow-up for Phone Call        pt scheduled for cpx on wed 12-22-2009 with SN.  pt calling to make sure labs in computer for monday morning 12-20-2009. i looked in IDX and EMR and didn't see any orders.  Please advise if ok to put labs in and if so, please advise what labs and dx codes. thanks . Aundra Millet Reynolds LPN  December 17, 4780 9:56 AM   Additional Follow-up for Phone Call Additional follow up Details #1::        labs are in computer for labs on 4-4.  called and spoke with pt and he is aware  Randell Loop CMA  December 17, 2009 2:18 PM

## 2010-12-20 ENCOUNTER — Telehealth: Payer: Self-pay | Admitting: Pulmonary Disease

## 2010-12-20 DIAGNOSIS — Z0000 Encounter for general adult medical examination without abnormal findings: Secondary | ICD-10-CM

## 2010-12-20 NOTE — Telephone Encounter (Signed)
Dr. Kriste Basque, pt has a cpx scheduled for 12/28/10.  He would like to com in on Monday for labs.  Pls advise what labs he will need.  Thanks!

## 2010-12-21 NOTE — Telephone Encounter (Signed)
Pt will come by for blood work on Mon., 12/26/2010 and knows to come fasting. Labs entered.

## 2010-12-21 NOTE — Telephone Encounter (Signed)
Per SN---ok to use the cpx code for labs---lip, bmp,hepat, cbcd, tsh, psa and ua.  thanks

## 2010-12-26 ENCOUNTER — Other Ambulatory Visit (INDEPENDENT_AMBULATORY_CARE_PROVIDER_SITE_OTHER): Payer: 59 | Admitting: Pulmonary Disease

## 2010-12-26 ENCOUNTER — Other Ambulatory Visit (INDEPENDENT_AMBULATORY_CARE_PROVIDER_SITE_OTHER): Payer: 59

## 2010-12-26 ENCOUNTER — Ambulatory Visit (INDEPENDENT_AMBULATORY_CARE_PROVIDER_SITE_OTHER)
Admission: RE | Admit: 2010-12-26 | Discharge: 2010-12-26 | Disposition: A | Discharge status: Home / Self Care | Payer: Self-pay | Source: Ambulatory Visit | Attending: Pulmonary Disease | Admitting: Pulmonary Disease

## 2010-12-26 ENCOUNTER — Other Ambulatory Visit: Payer: Self-pay | Admitting: Pulmonary Disease

## 2010-12-26 DIAGNOSIS — Z0000 Encounter for general adult medical examination without abnormal findings: Secondary | ICD-10-CM

## 2010-12-26 DIAGNOSIS — E785 Hyperlipidemia, unspecified: Secondary | ICD-10-CM

## 2010-12-26 LAB — CBC WITH DIFFERENTIAL/PLATELET
Basophils Absolute: 0 10*3/uL (ref 0.0–0.1)
Eosinophils Absolute: 0.1 10*3/uL (ref 0.0–0.7)
HCT: 45.2 % (ref 39.0–52.0)
Hemoglobin: 16 g/dL (ref 13.0–17.0)
Lymphs Abs: 1.1 10*3/uL (ref 0.7–4.0)
MCHC: 35.3 g/dL (ref 30.0–36.0)
Neutro Abs: 3.9 10*3/uL (ref 1.4–7.7)
RDW: 13.6 % (ref 11.5–14.6)

## 2010-12-26 LAB — URINALYSIS
Specific Gravity, Urine: 1.015 (ref 1.000–1.030)
Total Protein, Urine: NEGATIVE
Urine Glucose: NEGATIVE
Urobilinogen, UA: 0.2 (ref 0.0–1.0)

## 2010-12-26 LAB — BASIC METABOLIC PANEL
CO2: 28 mEq/L (ref 19–32)
Calcium: 9.1 mg/dL (ref 8.4–10.5)
GFR: 68.75 mL/min (ref >60.00)
Potassium: 4.1 mEq/L (ref 3.5–5.1)
Sodium: 136 mEq/L (ref 135–145)

## 2010-12-26 LAB — LDL CHOLESTEROL, DIRECT: Direct LDL: 108.3 mg/dL

## 2010-12-26 LAB — HEPATIC FUNCTION PANEL
Alkaline Phosphatase: 86 U/L (ref 39–117)
Bilirubin, Direct: 0.2 mg/dL (ref 0.0–0.3)
Total Protein: 6.5 g/dL (ref 6.0–8.3)

## 2010-12-26 LAB — LIPID PANEL
Total CHOL/HDL Ratio: 5
Triglycerides: 214 mg/dL — ABNORMAL HIGH (ref 0.0–149.0)

## 2010-12-27 ENCOUNTER — Encounter: Payer: Self-pay | Admitting: Pulmonary Disease

## 2010-12-28 ENCOUNTER — Encounter: Payer: Self-pay | Admitting: Pulmonary Disease

## 2010-12-28 ENCOUNTER — Ambulatory Visit (INDEPENDENT_AMBULATORY_CARE_PROVIDER_SITE_OTHER): Payer: 59 | Admitting: Pulmonary Disease

## 2010-12-28 VITALS — BP 124/74 | HR 83 | Temp 97.8°F | Ht 68.0 in | Wt 193.6 lb

## 2010-12-28 DIAGNOSIS — Z0000 Encounter for general adult medical examination without abnormal findings: Secondary | ICD-10-CM

## 2010-12-28 MED ORDER — ZOLPIDEM TARTRATE 10 MG PO TABS: ORAL_TABLET | ORAL | Status: DC

## 2010-12-28 MED ORDER — SIMVASTATIN 20 MG PO TABS: 20.0000 mg | ORAL_TABLET | Freq: Every day | ORAL | Status: DC

## 2010-12-28 MED ORDER — OLOPATADINE HCL 0.2 % OP SOLN: OPHTHALMIC | Status: DC

## 2010-12-28 NOTE — Patient Instructions (Signed)
Today we updated your med list & refilled your meds for 90d supplies as requested...    Try the Ambien as we discussed & let me know how it's working...  We reviewed your recent fasting blood work & gave you a copy for your records... Call for any problems... Let's plan a follow up appt in 1 yr, sooner if needed.Marland KitchenMarland Kitchen

## 2010-12-28 NOTE — Progress Notes (Signed)
Subjective:    Patient ID: Dwayne Larsen, male    DOB: 1956-01-13, 55 y.o.   MRN: [id number]  HPI 55 y/o WM here for a follow up visit & CPX...  He is followed for general medical purposes with:  AR;  Hx palpit;  Hyperchol;  Hx elev PSA- ret to norm w/ antibiotic Rx;  DJD/ Gout;  Hx LBP...  ~  December 22, 2009:  GI symptoms diminished over the last month, back to ~baseline... otherw feeling well- no new complaints or concerns x Insomnia & would like to try AMBIEN Prn...  he had labs done 12/20/09 and review shows incr PSA to 4.96 (it was 0.71 last yr)... he specif denies any prostate manipulation, dysuria, pain, LTOS, etc... offered referral to Urology now, vs trial Antibiotic for several weeks & repeat PSA in 75mo (he prefers the latter & f/u PSA= 0.71)...  ~  December 28, 2010:  Yearly ROV & stable overall just c/o being tired, getting older, not resting well;  He goes to sleep about 11PM & usually awakes about 2-4AM w/ difficulty returning to sleep;  Usually awakes 7-8AM refreshed but sometimes like he didn't rest;  He may get sleepy in afternoon but never naps;  ?snoring (wife does but if he does it doesn't bother his wife);  Not a restless sleeper;  ESS= 8, & we discussed refill Ambien, try Melatonin, etc...  CXR, EKG, & recent fasting labs all look good x TG= 214 & we discussed better low fat diet...       Problems List:    ALLERGY (ICD-995.3) - uses OTC allergy meds as needed + "netti pot"... he had pos testing by [facility] in 1999 w/ sens to pollen, trees, weeds... he has received Depo shots for severe spring allergy symptoms in the past.  Hx of PALPITATIONS (ICD-785.1) - cardiac eval by DrKatz in 2004 was neg. ~  baseline EKG w/ NSR, WNL...  ~  NuclearStressTest 11/04 was neg for scar or ischemia, EF=56%... ~  4/12:  BP= 124/74 & he denies CP, palpit, dizzy, SOB, edema, etc...  HYPERCHOLESTEROLEMIA (ICD-272.0) - on ZOCOR 40mg - 1/2 tab daily... TRICOR was added to his regimen in 2007 but he stopped this  on his own...  ~  FLP 3/08 on Simva20/ Tricor showed TChol 152, TG 99 (down from 444), HDL 40, LDL 93... same. ~  FLP 4/09 on Simva20 showed TChol 184, TG 245, HDL 38, LDL 108... he didn't want to restart Fibrate. ~  FLP 3/10 on Simva20 showed TChol 145, TG 122, HDL 34, LDL 87... improved. ~  FLP 4/11 on Simva20 showed TChol 128, TG 299, HDL 34, LDL 54... needs better low fat diet. ~  FLP 4/12 on Simva20 QOD (he cut back on his own) showed TChol 182, TG 214, HDL 38, LDL 108... rec incr back to daily.  ABDOMINAL PAIN, LEFT LOWER QUADRANT (ICD-789.04) - see eval 3/11  ELEVATED PROSTATE SPECIFIC ANTIGEN (ICD-790.93) ~  labs 3/10 showed PSA= 0.71 ~  labs 4/11 showed PSA= 4.96 & DRE is benign- normal feeling gland... he opted for trial antibiotic Rx & repeat lab ==> 0.71 ~  Labs 4/12 showed PSA= 1.04  DEGENERATIVE JOINT DISEASE (ICD-715.90) - he is s/p arthroscopic surg both knees by DrWainer  GOUT (ICD-274.9) - Uric acid level 4/10 = 8.5 & pt was offered Allopurinol Rx, but he preferred to control this on a low purine diet... no recurrent acute arthritic problems or gout attacks so far...  Hx  of BACK PAIN, LUMBAR (ICD-724.2) - he takes Advil OTC 3-4 Qod or so... he is s/p L4-5 lumbar laminotomy & microdiscectomy 2003 by DrNudelman...  HEALTH MAINTENANCE:   ~  GI = DrMedoff w/ flex sig 7/93 = neg; & colonoscopy 4/08 showing rare divertics, no polyps...  ~  GU = 4/11 OV w/ norm DRE & sudden rise in PSA to 4.96... trial Doxy & f/u PSA 63mo- may need bx. ~  Immunizations:  ?last Tetanus shot;  he has not been inclined to get the seasonal flu vaccines.   Past Surgical History  Procedure Date  . Knee arthroscopy     right  . Laminectomy and microdiscectomy lumbar spine 2003    L4-5  . Rectal surgery 2003    Dr. Orson Slick    Outpatient Encounter Prescriptions as of 12/28/2010  Medication Sig Dispense Refill  . aspirin 81 MG tablet Take 81 mg by mouth daily.        . Multiple Vitamin  (MULTIVITAMIN) tablet Take 1 tablet by mouth daily.        . simvastatin (ZOCOR) 20 MG tablet Take 20 mg by mouth daily.          Allergies  Allergen Reactions  . Penicillins     REACTION: as a child--unsure of reaction    Review of Systems       The patient denies fever, chills, sweats, anorexia, fatigue, weakness, malaise, weight loss, sleep disorder, blurring, diplopia, eye irritation, eye discharge, vision loss, eye pain, photophobia, earache, ear discharge, tinnitus, decreased hearing, nasal congestion, nosebleeds, sore throat, hoarseness, chest pain, palpitations, syncope, dyspnea on exertion, orthopnea, PND, peripheral edema, cough, dyspnea at rest, excessive sputum, hemoptysis, wheezing, pleurisy, nausea, vomiting, diarrhea, constipation, change in bowel habits, abdominal pain, melena, hematochezia, jaundice, gas/bloating, indigestion/heartburn, dysphagia, odynophagia, dysuria, hematuria, urinary frequency, urinary hesitancy, nocturia, incontinence, back pain, joint pain, joint swelling, muscle cramps, muscle weakness, stiffness, arthritis, sciatica, restless legs, leg pain at night, leg pain with exertion, rash, itching, dryness, suspicious lesions, paralysis, paresthesias, seizures, tremors, vertigo, transient blindness, frequent falls, frequent headaches, difficulty walking, depression, anxiety, memory loss, confusion, cold intolerance, heat intolerance, polydipsia, polyphagia, polyuria, unusual weight change, abnormal bruising, bleeding, enlarged lymph nodes, urticaria, allergic rash, hay fever, and recurrent infections.     Objective:   Physical Exam     WD, WN, 55 y/o WM in NAD... GENERAL:  Alert & oriented; pleasant & cooperative... HEENT:  [place]/AT, EOM-wnl, PERRLA, Fundi-benign, EACs-clear, TMs-wnl, NOSE-clear, THROAT-clear & wnl. NECK:  Supple w/ full ROM; no JVD; normal carotid impulses w/o bruits; no thyromegaly or nodules palpated; no lymphadenopathy. CHEST:  Clear to P & A;  without wheezes/ rales/ or rhonchi. HEART:  Regular Rhythm; without murmurs/ rubs/ or gallops. ABDOMEN:  Soft & nontender; normal bowel sounds; no organomegaly or masses detected; no hernia palpated. RECTAL:  decr sphincter tone (prev surg), prostate feels normal= 2+size, no nodules; stool heme neg... EXT: without deformities or arthritic changes; no varicose veins/ venous insuffic/ or edema. NEURO:  CN's intact; motor testing normal; sensory testing normal; gait normal & balance OK. DERM:  No lesions noted; no rash etc...   Assessment & Plan:   CPX>  Good general heath, needs better sleep hygiene & we discussed trial 1/2 Ambien at 2-4AM as needed...  AR>  Stable w/ OTC meds as needed...  CHOL>  We reviewed his recent FLP & rec incr back to daily Simva20...  DJD/ GOUT>  As noted he is stable w/ OTC meds prn &  no interval gout attacks...  Other medical issues as noted.Marland KitchenMarland Kitchen

## 2011-01-01 ENCOUNTER — Encounter: Payer: Self-pay | Admitting: Pulmonary Disease

## 2011-01-05 ENCOUNTER — Telehealth: Payer: Self-pay | Admitting: Pulmonary Disease

## 2011-01-05 MED ORDER — AZELASTINE HCL 0.05 % OP SOLN: 1.0000 [drp] | Freq: Two times a day (BID) | OPHTHALMIC | Status: AC

## 2011-01-05 NOTE — Telephone Encounter (Signed)
Per SN--ok to change to the optivar if this is ok with the pt.  Just make the pt aware that insurance will not cover the pataday. thanks

## 2011-01-05 NOTE — Telephone Encounter (Signed)
Pt states the Pataday is working well for him, however, this medication requires a prior [place]. I have spoke with [employer] at [phone] and this medication is not covered under the pt's drug plan. The covered alternative is Optivar. Pls advise if this would be an appropriate alternative for the pt.  Member ID # is [id number]. Allergies  Allergen Reactions  . Penicillins     REACTION: as a child--unsure of reaction

## 2011-01-05 NOTE — Telephone Encounter (Signed)
Pt aware ins will not cover Pataday and is okay with changing to the Optivar. He will call if he has any problems or questions on the new medication. New RX sent to pharmacy for Oprivar. Pharmacy aware to d/c pataday rx.

## 2011-02-03 NOTE — Op Note (Signed)
[facility]  Patient:    Dwayne Larsen, Dwayne Larsen Visit Number: [id number] MRN: [id number]          Service Type: MED Location: [phone] Attending Physician:  Skeet Simmer Md Dictated by:   Zigmund Daniel, M.D. Proc. Date: 01/31/02 Admit Date:  02/01/2002 Discharge Date: 02/01/2002   CC:         Griffith Citron, M.D.                           Operative Report  PREOPERATIVE DIAGNOSIS:  Anorectal laceration.  POSTOPERATIVE DIAGNOSIS:  Anorectal laceration.  OPERATION PERFORMED:  Repair of laceration after examination under anesthesia.  SURGEON:  Zigmund Daniel, M.D.  ANESTHESIA:  General.  DESCRIPTION OF PROCEDURE:  After the patient was monitored and anesthetized and had routine preparation and draping of the perineum, I carefully examined the anorectum. There was a large cavity in the ischial rectal fossa to the posterolateral side of the anorectum on the left. When the area was exposed, some bleeding was occurring and I controlled that with cautery by removal of a small amount of loose tissue and suture ligature with 2-0 chromic. Then using a sterile proctoscope, I examined the distal rectum, removed the blood which was present there and saw that the mucosa was bland and normal inappearance with no evidence of any trauma beyond approximately 2-3 cm above the dentate line. I then withdrew the proctoscope and replaced it with the anoscope and further exposed the injured area. I anesthetized the area with long acting local anesthetic. I then noted that there was considerable portion of the external sphincter which was lacerated and I could identify the separated ends. I approximated them with several figure-of-eight sutures of 2-0 chromic and that considerably reduced the space and restored some of the sphincter tone. I then debrided a little bit of looseand devitalized tissue. Because of the large space in the ischial rectalfossa, I  placed a 1/2 inch Penrose drain underneath the muscular repair and brought it out through a small stab incision on the buttock at the most lateral extent of the cavity. I then repaired the laceration of the distal rectal mucosa and of the anal canal and the perianal skin with 2-0 chromic suture. Hemostasis was good. The area was clean and the tissues appeared healthy and vital at the conclusion of the procedure. After application of a bulky bandage, he was awakened and went to the recovery room in satisfactory condition. Dictated by:   Zigmund Daniel, M.D. Attending Physician:  Skeet Simmer Md DD:  01/31/02 TD:  02/02/02 Job: [id number]

## 2011-02-03 NOTE — Op Note (Signed)
NAMECHENEY, EWART                            ACCOUNT NO.:  [id number]   MEDICAL RECORD NO.:  [id number]                   PATIENT TYPE:  INP   LOCATION:  3010                                 FACILITY:  [facility]   PHYSICIAN:  Hewitt Shorts, M.D.            DATE OF BIRTH:  May 09, 1956   DATE OF PROCEDURE:  09/08/2002  DATE OF DISCHARGE:                                 OPERATIVE REPORT   PREOPERATIVE DIAGNOSIS:  Right fourth/fifth lumbar disk herniation.   POSTOPERATIVE DIAGNOSIS:  Right fourth/fifth lumbar disk herniation.   PROCEDURE:  Right L4-5 lumbar laminotomy and microdiskectomy.   SURGEON:  Hewitt Shorts, M.D.   ASSISTANT:  Payton Doughty, M.D.   ANESTHESIA:  General endotracheal.   INDICATION:  The patient is a 55 year old man who presented with a right  lumbar radiculopathy who was found by MRI scan to have a right L4-5 lumbar  disk herniation with a fragment that had migrated caudally behind the body  of L5.  The decision was made to proceed with laminotomy and  microdiskectomy.   PROCEDURE:  The patient was brought to the operating room, induced under  general endotracheal anesthesia.  The patient was turned into a prone  position.  Lumbar region was prepped with Betadine soap and solution  and  prepped and draped in the usual sterile fashion.  The midline incision was  infiltrated with 1% lidocaine with epinephrine.  An x-ray was taken, and the  L4-5 level was identified.  A midline incision was made and carried down  through the subcutaneous tissue.  Bipolar cautery and electrocautery were  used to maintain hemostasis.  Dissection was carried down to the lumbar  fascia which was incised on the right side of the midline, and the  paraspinal muscles were dissected from the spinous processes and laminae in  subperiosteal fashion.  The L4-5 interlaminar space was identified and  another x-ray taken to confirm the localization.  Then, a laminotomy was  performed  using the [facility] Max drill and Kerrison punches.  The microscope  was draped and brought into the field to provide magnification,  illumination, and visualization.  The remainder of the procedure was  performed using microdissection and microsurgical technique.   The ligamentum flavum was carefully resected, and the underlying thecal sac  and right L5 nerve root were identified.  The laminotomy was extended  laterally.  We gently mobilized the thecal sac and nerve root medially.  The  disk herniation was identified, and a fragment located ventral to the nerve  root and caudal to the disk space was removed.  It extended back into the  disk space such that the rent through which it herniated was apparent.  Therefore, we further opened the annulus and proceeded with a diskectomy  using a variety of pituitary rongeurs.  Once all loose fragments of disk  material were removed from both the  disk space and the epidural space, we  established hemostasis.  We used bipolar cautery as well as Gelfoam soaked  in thrombin.  All of the Gelfoam was then removed, and hemostasis was  confirmed.  We then instilled 2 cc of fentanyl and 80 mg of Depo-Medrol into  the epidural space and then proceeded with closure.  The deep fascia was  closed with interrupted undyed #1 Vicryl suture.  The subcutaneous and the  subcuticular layer closed in interrupted 2-0 undyed Vicryl suture.  The skin  edges were approximated with Dermabond.   The patient tolerated the procedure well.  The estimated blood loss was less  than 25 cc.  The sponge and needle counts were correct.  Following surgery,  the patient is to be turned back into the supine position, reversed from  anesthetic and extubated and then transferred to the recovery room for  further care.                                               Hewitt Shorts, M.D.    RWN/MEDQ  D:  09/08/2002  T:  09/08/2002  Job:  [id number]

## 2011-03-01 ENCOUNTER — Telehealth: Payer: Self-pay | Admitting: Pulmonary Disease

## 2011-03-01 ENCOUNTER — Other Ambulatory Visit (INDEPENDENT_AMBULATORY_CARE_PROVIDER_SITE_OTHER): Payer: 59

## 2011-03-01 ENCOUNTER — Ambulatory Visit (INDEPENDENT_AMBULATORY_CARE_PROVIDER_SITE_OTHER): Payer: 59

## 2011-03-01 VITALS — BP 132/80 | HR 70 | Temp 98.5°F

## 2011-03-01 DIAGNOSIS — M109 Gout, unspecified: Secondary | ICD-10-CM

## 2011-03-01 LAB — URIC ACID: Uric Acid, Serum: 8 mg/dL — ABNORMAL HIGH (ref 4.0–7.8)

## 2011-03-01 MED ORDER — METHYLPREDNISOLONE ACETATE 80 MG/ML IJ SUSP
80.0000 mg | Freq: Once | INTRAMUSCULAR | Status: AC
  Administered 2011-03-01: 80 mg via INTRA_ARTICULAR

## 2011-03-01 MED ORDER — PREDNISONE (PAK) 5 MG PO TABS: ORAL_TABLET | ORAL | Status: DC

## 2011-03-01 NOTE — Telephone Encounter (Signed)
rx for pred dose pak sent to pharmacy.

## 2011-03-01 NOTE — Telephone Encounter (Signed)
[id number].   Also, SN please advise on the dosage of the pred dosepak and for how many days?  Thanks.

## 2011-03-01 NOTE — Telephone Encounter (Signed)
Called and spoke with pt. Pt states he has had gout in the past and believes he is having a flare up in his L heel.  Pain started yesterday.  Pt is requesting rx for this.  Pt states he previously had Colchicine.  Pt states he doesn't need a rx for pain med- states he just requests something for the gout.

## 2011-03-01 NOTE — Telephone Encounter (Signed)
pred pak 5 mg   6 day pack.

## 2011-03-01 NOTE — Telephone Encounter (Signed)
Per SN---needs uric acid and sed rate done in lab--we will call results---give him depo 80mg   im today and rx for pred dosepak for quicker help with gout--can come up here today after his labs for injection.  thanks

## 2011-03-01 NOTE — Telephone Encounter (Signed)
Order placed for labs and pt aware to come to lab first and then back to [place] for depo inj. Will forward msg to Leigh to see what dose of predpack to send.

## 2011-03-02 ENCOUNTER — Telehealth: Payer: Self-pay | Admitting: Pulmonary Disease

## 2011-03-02 NOTE — Telephone Encounter (Signed)
Called and spoke with pt and per SN--sed rate is not elevated--normal numbers, and  Uric slightly elevated at 8.0  Was 8.5 2 yrs ago   Pt stated that he just started on the dosepak today but he will call tomorrow afternoon to let us know how he is doing.    Per SN---if pt is not responding to the pred refer to ortho and if recurrent prob--rec for allopurinol 300mg  daily for gout preventative.  Pt is aware to call tomorrow for the update.

## 2011-03-02 NOTE — Telephone Encounter (Signed)
Notes Recorded by Michele Mcalpine, MD on 03/02/2011 at 8:10 AM Pt notified by Leigh... SMN Uric sl elev & offered Allopurinol gout prevention Rx...   Leigh, pt requesting lab results.  They are in chart but pls advise on the allopurinol rx.  Thanks!

## 2011-04-21 MED ORDER — METHYLPREDNISOLONE ACETATE 80 MG/ML IJ SUSP
80.0000 mg | Freq: Once | INTRAMUSCULAR | Status: AC
  Administered 2011-04-21: 80 mg via INTRA_ARTICULAR

## 2011-09-29 ENCOUNTER — Ambulatory Visit: Payer: Self-pay

## 2011-09-29 ENCOUNTER — Ambulatory Visit (INDEPENDENT_AMBULATORY_CARE_PROVIDER_SITE_OTHER): Payer: 59

## 2011-09-29 DIAGNOSIS — Z23 Encounter for immunization: Secondary | ICD-10-CM

## 2011-12-05 ENCOUNTER — Telehealth: Payer: Self-pay | Admitting: Pulmonary Disease

## 2011-12-05 NOTE — Telephone Encounter (Signed)
Spoke with pt and he states needs to reschedule 12/29/11 ov since SN will not be in the office and so I rescheduled his appt for 01/17/12.

## 2011-12-25 ENCOUNTER — Telehealth: Payer: Self-pay | Admitting: Pulmonary Disease

## 2011-12-27 NOTE — Telephone Encounter (Signed)
Wil, close message

## 2011-12-29 ENCOUNTER — Ambulatory Visit: Payer: 59 | Admitting: Pulmonary Disease

## 2012-01-08 ENCOUNTER — Other Ambulatory Visit: Payer: Self-pay | Admitting: Pulmonary Disease

## 2012-01-09 ENCOUNTER — Telehealth: Payer: Self-pay | Admitting: Pulmonary Disease

## 2012-01-09 ENCOUNTER — Other Ambulatory Visit: Payer: Self-pay | Admitting: Pulmonary Disease

## 2012-01-09 DIAGNOSIS — E7800 Pure hypercholesterolemia, unspecified: Secondary | ICD-10-CM

## 2012-01-09 DIAGNOSIS — Z0000 Encounter for general adult medical examination without abnormal findings: Secondary | ICD-10-CM

## 2012-01-09 DIAGNOSIS — R002 Palpitations: Secondary | ICD-10-CM

## 2012-01-09 DIAGNOSIS — R972 Elevated prostate specific antigen [PSA]: Secondary | ICD-10-CM

## 2012-01-09 NOTE — Telephone Encounter (Signed)
Pt is requesting to have labs done prior to his cpx on 01/17/12 at 11:00 Please advise SN thanks

## 2012-01-09 NOTE — Telephone Encounter (Signed)
Called, spoke with pt.  I informed her he can do the cxr after OV with SN if this is needed.  He verbalized understanding of this.

## 2012-01-09 NOTE — Telephone Encounter (Signed)
Can do the cxr after the ov with SN.  thanks

## 2012-01-09 NOTE — Telephone Encounter (Signed)
Called, spoke with pt.  I informed him labs have been placed in the computer.  He verbalized understanding of this and would like to know if he will need a cxr as well.  Dr. Kriste Basque, pls advise. Thank you.

## 2012-01-09 NOTE — Telephone Encounter (Signed)
Labs have been placed in the computer for the pt.  thanks 

## 2012-01-10 ENCOUNTER — Other Ambulatory Visit (INDEPENDENT_AMBULATORY_CARE_PROVIDER_SITE_OTHER): Payer: 59

## 2012-01-10 DIAGNOSIS — Z0000 Encounter for general adult medical examination without abnormal findings: Secondary | ICD-10-CM

## 2012-01-10 LAB — LIPID PANEL: VLDL: 51.6 mg/dL — ABNORMAL HIGH (ref 0.0–40.0)

## 2012-01-10 LAB — HEPATIC FUNCTION PANEL
ALT: 34 U/L (ref 0–53)
AST: 24 U/L (ref 0–37)
Total Bilirubin: 0.6 mg/dL (ref 0.3–1.2)

## 2012-01-10 LAB — CBC WITH DIFFERENTIAL/PLATELET
Basophils Relative: 0.5 % (ref 0.0–3.0)
Eosinophils Absolute: 0.1 10*3/uL (ref 0.0–0.7)
Eosinophils Relative: 2.3 % (ref 0.0–5.0)
HCT: 45.7 % (ref 39.0–52.0)
Hemoglobin: 15.9 g/dL (ref 13.0–17.0)
Lymphs Abs: 1.2 10*3/uL (ref 0.7–4.0)
MCHC: 34.8 g/dL (ref 30.0–36.0)
MCV: 84.2 fl (ref 78.0–100.0)
Monocytes Absolute: 0.4 10*3/uL (ref 0.1–1.0)
Neutro Abs: 3.6 10*3/uL (ref 1.4–7.7)
RBC: 5.43 Mil/uL (ref 4.22–5.81)
WBC: 5.4 10*3/uL (ref 4.5–10.5)

## 2012-01-10 LAB — BASIC METABOLIC PANEL
BUN: 21 mg/dL (ref 6–23)
CO2: 30 mEq/L (ref 19–32)
Chloride: 103 mEq/L (ref 96–112)
Potassium: 4.4 mEq/L (ref 3.5–5.1)

## 2012-01-17 ENCOUNTER — Encounter: Payer: Self-pay | Admitting: Pulmonary Disease

## 2012-01-17 ENCOUNTER — Ambulatory Visit (INDEPENDENT_AMBULATORY_CARE_PROVIDER_SITE_OTHER): Payer: 59 | Admitting: Pulmonary Disease

## 2012-01-17 VITALS — BP 130/88 | HR 67 | Temp 97.0°F | Ht 68.0 in | Wt 197.0 lb

## 2012-01-17 DIAGNOSIS — Z0000 Encounter for general adult medical examination without abnormal findings: Secondary | ICD-10-CM

## 2012-01-17 DIAGNOSIS — M1990 Unspecified osteoarthritis, unspecified site: Secondary | ICD-10-CM

## 2012-01-17 DIAGNOSIS — E785 Hyperlipidemia, unspecified: Secondary | ICD-10-CM

## 2012-01-17 DIAGNOSIS — T7840XA Allergy, unspecified, initial encounter: Secondary | ICD-10-CM

## 2012-01-17 MED ORDER — ZOLPIDEM TARTRATE 10 MG PO TABS: ORAL_TABLET | ORAL | Status: DC

## 2012-01-17 MED ORDER — SIMVASTATIN 20 MG PO TABS: 20.0000 mg | ORAL_TABLET | Freq: Every day | ORAL | Status: DC

## 2012-01-17 NOTE — Patient Instructions (Signed)
Today we updated your med list in our EPIC system...    Continue your current medications the same...    We refilled your meds per request...  We did your follow up fasting blood work & gave you a copy for your records...  Remember to improve your low fat, wt reducing diet efforts...  Call for any questions...  We wrote a prescription for the Shingles vaccine as we discussed.Marland KitchenMarland Kitchen

## 2012-01-28 ENCOUNTER — Encounter: Payer: Self-pay | Admitting: Pulmonary Disease

## 2012-01-28 NOTE — Progress Notes (Signed)
Subjective:    Patient ID: Dwayne Larsen, male    DOB: [birth year], 56 y.o.   MRN: [id number]  HPI 56 y/o WM here for a follow up visit & CPX...  He is followed for general medical purposes with:  AR;  Hx palpit;  Hyperchol;  Hx elev PSA- ret to norm w/ antibiotic Rx;  DJD/ Gout;  Hx LBP...  ~  December 22, 2009:  GI symptoms diminished over the last month, back to ~baseline... otherw feeling well- no new complaints or concerns x Insomnia & would like to try AMBIEN Prn...  he had labs done 12/20/09 and review shows incr PSA to 4.96 (it was 0.71 last yr)... he specif denies any prostate manipulation, dysuria, pain, LTOS, etc... offered referral to Urology now, vs trial Antibiotic for several weeks & repeat PSA in 33mo (he prefers the latter & f/u PSA= 0.71)...  ~  December 28, 2010:  Yearly ROV & stable overall just c/o being tired, getting older, not resting well;  He goes to sleep about 11PM & usually awakes about 2-4AM w/ difficulty returning to sleep;  Usually awakes 7-8AM refreshed but sometimes like he didn't rest;  He may get sleepy in afternoon but never naps;  ?snoring (wife does but if he does it doesn't bother his wife);  Not a restless sleeper;  ESS= 8, & we discussed refill Ambien, try Melatonin, etc...  CXR, EKG, & recent fasting labs all look good x TG= 214 & we discussed better low fat diet...  ~  Jan 17, 2012:  Yearly ROV & CPX> Guled has had a good yr overall; no new complaints or concerns; his wife had Shingles 7 he wants Rx for the vaccine- written;  We reviewed his medical problems, medications, xrays & labs> see below>> EKG 5/13 showed NSR, rate60, WNL, NAD... LABS 4/13:  FLP- chol=ok TG=258 HDL=34;  Chems- wnl;  CBC- wnl;  TSH=2.17;  PSA=0.87        Problems List:    ALLERGY (ICD-995.3) - uses OTC allergy meds as needed + "netti pot"... he had pos testing by [facility] in 1999 w/ sens to pollen, trees, weeds (prev on immunotherapy & he felt that he was improved while on the shots, now symptoms  slowly increasing off the shots)... he has received Depo shots for severe spring allergy symptoms in the past.  Hx of PALPITATIONS (ICD-785.1) - on ASA 81mg /d... ~  cardiac eval by Delton See in 2004 was neg. ~  baseline EKG w/ NSR, WNL...  ~  NuclearStressTest 11/04 was neg for scar or ischemia, EF=56%... ~  4/12:  BP= 124/74 & he denies CP, palpit, dizzy, SOB, edema, etc... ~  5/13:  BP= 130/88 & he remains asymptomatic...  HYPERCHOLESTEROLEMIA (ICD-272.0) - on ZOCOR 20mg  daily... TRICOR was added to his regimen in 2007 but he stopped this on his own...  ~  FLP 3/08 on Simva20/ Tricor showed TChol 152, TG 99 (down from 444), HDL 40, LDL 93... same. ~  FLP 4/09 on Simva20 showed TChol 184, TG 245, HDL 38, LDL 108... he didn't want to restart Fibrate. ~  FLP 3/10 on Simva20 showed TChol 145, TG 122, HDL 34, LDL 87... improved. ~  FLP 4/11 on Simva20 showed TChol 128, TG 299, HDL 34, LDL 54... needs better low fat diet. ~  FLP 4/12 on Simva20 QOD (he cut back on his own) showed TChol 182, TG 214, HDL 38, LDL 108... rec incr back to daily. ~  FLP 4/13  on Simva20 showed TChol 151, TG 258, HDL 34, LDL 73  ABDOMINAL PAIN, LEFT LOWER QUADRANT (ICD-789.04) - see eval 3/11  ELEVATED PROSTATE SPECIFIC ANTIGEN (ICD-790.93) ~  labs 3/10 showed PSA= 0.71 ~  labs 4/11 showed PSA= 4.96 & DRE is benign- normal feeling gland... he opted for trial antibiotic Rx & repeat lab ==> 0.71 ~  Labs 4/12 showed PSA= 1.04 ~  Labs 4/13 showed PSA= 0.87  DEGENERATIVE JOINT DISEASE (ICD-715.90) - he is s/p arthroscopic surg both knees by DrWainer  GOUT (ICD-274.9) - Uric acid level 4/10 = 8.5 & pt was offered Allopurinol Rx, but he preferred to control this on a low purine diet... no recurrent acute arthritic problems or gout attacks so far...  Hx of BACK PAIN, LUMBAR (ICD-724.2) - he takes Advil OTC 3-4 Qod or so... he is s/p L4-5 lumbar laminotomy & microdiscectomy 2003 by DrNudelman...  HEALTH MAINTENANCE:   ~  GI  = DrMedoff w/ flex sig 7/93 = neg; & colonoscopy 4/08 showing rare divertics, no polyps...  ~  GU = 4/11 OV w/ norm DRE & sudden rise in PSA to 4.96... trial Doxy & f/u PSA 33mo= 1.42... PSA remained wnl since then. ~  Immunizations:  ?last Tetanus shot;  he has not been inclined to get the seasonal flu vaccines;  He requested Rx for Shingles vax after wife had an outbreak.   Past Surgical History  Procedure Date  . Knee arthroscopy     right  . Laminectomy and microdiscectomy lumbar spine 2003    L4-5  . Rectal surgery 2003    Dr. Orson Slick    Outpatient Encounter Prescriptions as of 01/17/2012  Medication Sig Dispense Refill  . aspirin 81 MG tablet Take 81 mg by mouth daily.        . Multiple Vitamin (MULTIVITAMIN) tablet Take 1 tablet by mouth daily.        . simvastatin (ZOCOR) 20 MG tablet Take 1 tablet (20 mg total) by mouth at bedtime.  90 tablet  3  . DISCONTD: simvastatin (ZOCOR) 20 MG tablet TAKE 1 TABLET BY MOUTH EVERY DAY  90 tablet  2  . zolpidem (AMBIEN) 10 MG tablet Take one tablet at bedtime as directed  30 tablet  5  . DISCONTD: predniSONE, Pak, (STERAPRED) 5 MG TABS 6 day prednisone dose pak. Take as directed  1 each  0  . DISCONTD: zolpidem (AMBIEN) 10 MG tablet Take one tablet at bedtime as directed  30 tablet  5    Allergies  Allergen Reactions  . Penicillins     REACTION: as a child--unsure of reaction    Review of Systems       The patient denies fever, chills, sweats, anorexia, fatigue, weakness, malaise, weight loss, sleep disorder, blurring, diplopia, eye irritation, eye discharge, vision loss, eye pain, photophobia, earache, ear discharge, tinnitus, decreased hearing, nasal congestion, nosebleeds, sore throat, hoarseness, chest pain, palpitations, syncope, dyspnea on exertion, orthopnea, PND, peripheral edema, cough, dyspnea at rest, excessive sputum, hemoptysis, wheezing, pleurisy, nausea, vomiting, diarrhea, constipation, change in bowel habits, abdominal  pain, melena, hematochezia, jaundice, gas/bloating, indigestion/heartburn, dysphagia, odynophagia, dysuria, hematuria, urinary frequency, urinary hesitancy, nocturia, incontinence, back pain, joint pain, joint swelling, muscle cramps, muscle weakness, stiffness, arthritis, sciatica, restless legs, leg pain at night, leg pain with exertion, rash, itching, dryness, suspicious lesions, paralysis, paresthesias, seizures, tremors, vertigo, transient blindness, frequent falls, frequent headaches, difficulty walking, depression, anxiety, memory loss, confusion, cold intolerance, heat intolerance, polydipsia, polyphagia, polyuria, unusual weight  change, abnormal bruising, bleeding, enlarged lymph nodes, urticaria, allergic rash, hay fever, and recurrent infections.     Objective:   Physical Exam     WD, WN, 56 y/o WM in NAD... GENERAL:  Alert & oriented; pleasant & cooperative... HEENT:  [place]/AT, EOM-wnl, PERRLA, Fundi-benign, EACs-clear, TMs-wnl, NOSE-clear, THROAT-clear & wnl. NECK:  Supple w/ full ROM; no JVD; normal carotid impulses w/o bruits; no thyromegaly or nodules palpated; no lymphadenopathy. CHEST:  Clear to P & A; without wheezes/ rales/ or rhonchi. HEART:  Regular Rhythm; without murmurs/ rubs/ or gallops. ABDOMEN:  Soft & nontender; normal bowel sounds; no organomegaly or masses detected; no hernia palpated. RECTAL:  decr sphincter tone (prev surg), prostate feels normal= 2+size, no nodules; stool heme neg... EXT: without deformities or arthritic changes; no varicose veins/ venous insuffic/ or edema. NEURO:  CN's intact; motor testing normal; sensory testing normal; gait normal & balance OK. DERM:  No lesions noted; no rash etc...  RADIOLOGY DATA:  Reviewed in the EPIC EMR & discussed w/ the patient...  LABORATORY DATA:  Reviewed in the EPIC EMR & discussed w/ the patient...   Assessment & Plan:   CPX>  Good general heath, he refuses flu shots but requests Rx for Shingles vaccine after  wife had an outbreak...  AR>  Stable w/ OTC meds as needed...  HYPERLIPID>  Chol improved on Simva20 daily but elev TG & needs better low fat diet...  DJD/ GOUT>  As noted he is stable w/ OTC meds prn & no interval gout attacks...  Other medical issues as noted...   Patient's Medications  New Prescriptions   No medications on file  Previous Medications   ASPIRIN 81 MG TABLET    Take 81 mg by mouth daily.     MULTIPLE VITAMIN (MULTIVITAMIN) TABLET    Take 1 tablet by mouth daily.    Modified Medications   Modified Medication Previous Medication   SIMVASTATIN (ZOCOR) 20 MG TABLET simvastatin (ZOCOR) 20 MG tablet      Take 1 tablet (20 mg total) by mouth at bedtime.    TAKE 1 TABLET BY MOUTH EVERY DAY   ZOLPIDEM (AMBIEN) 10 MG TABLET zolpidem (AMBIEN) 10 MG tablet      Take one tablet at bedtime as directed    Take one tablet at bedtime as directed  Discontinued Medications   PREDNISONE, PAK, (STERAPRED) 5 MG TABS    6 day prednisone dose pak. Take as directed

## 2012-02-04 ENCOUNTER — Ambulatory Visit (INDEPENDENT_AMBULATORY_CARE_PROVIDER_SITE_OTHER): Payer: 59 | Admitting: Physician Assistant

## 2012-02-04 VITALS — BP 128/76 | HR 76 | Temp 98.3°F | Resp 16 | Ht 66.25 in | Wt 195.0 lb

## 2012-02-04 DIAGNOSIS — H9201 Otalgia, right ear: Secondary | ICD-10-CM

## 2012-02-04 DIAGNOSIS — H6091 Unspecified otitis externa, right ear: Secondary | ICD-10-CM

## 2012-02-04 DIAGNOSIS — H9209 Otalgia, unspecified ear: Secondary | ICD-10-CM

## 2012-02-04 DIAGNOSIS — H60399 Other infective otitis externa, unspecified ear: Secondary | ICD-10-CM

## 2012-02-04 MED ORDER — CIPROFLOXACIN-DEXAMETHASONE 0.3-0.1 % OT SUSP: 5.0000 [drp] | Freq: Two times a day (BID) | OTIC | Status: AC

## 2012-02-04 NOTE — Patient Instructions (Signed)
Otitis Externa  Otitis externa ("swimmer's ear") is a germ (bacterial) or fungal infection of the outer ear canal (from the eardrum to the outside of the ear). Swimming in dirty water may cause swimmer's ear. It also may be caused by moisture in the ear from water remaining after swimming or bathing. Often the first signs of infection may be itching in the ear canal. This may progress to ear canal swelling, redness, and pus drainage, which may be signs of infection.  HOME CARE INSTRUCTIONS    Apply the antibiotic drops to the ear canal as prescribed by your doctor.   This can be a very painful medical condition. A strong pain reliever may be prescribed.   Only take over-the-counter or prescription medicines for pain, discomfort, or fever as directed by your caregiver.   If your caregiver has given you a follow-up appointment, it is very important to keep that appointment. Not keeping the appointment could result in a chronic or permanent injury, pain, hearing loss and disability. If there is any problem keeping the appointment, you must call back to this facility for assistance.  PREVENTION    It is important to keep your ear dry. Use the corner of a towel to wick water out of the ear canal after swimming or bathing.   Avoid scratching in your ear. This can damage the ear canal or remove the protective wax lining the canal and make it easier for germs (bacteria) or a fungus to grow.   You may use ear drops made of rubbing alcohol and vinegar after swimming to prevent future "swimmer's ear" infections. Make up a small bottle of equal parts white vinegar and alcohol. Put 3 or 4 drops into each ear after swimming.   Avoid swimming in lakes, polluted water, or poorly chlorinated pools.  SEEK MEDICAL CARE IF:    An oral temperature above 102 F (38.9 C) develops.   Your ear is still painful after 3 days and shows signs of getting worse (redness, swelling, pain, or pus).  MAKE SURE YOU:    Understand these  instructions.   Will watch your condition.   Will get help right away if you are not doing well or get worse.  Document Released: 09/04/2005 Document Revised: 08/24/2011 Document Reviewed: 04/10/2008  [employer] Patient Information 2012 [employer].

## 2012-02-04 NOTE — Progress Notes (Signed)
  Subjective:    Patient ID: Dwayne Larsen, male    DOB: April 28, 1956, 56 y.o.   MRN: [id number]  HPI  Right-sided ear pain beginning Friday.  Getting worse.  Hurts to open jaw.  Can't get a Q-Tip in the canal.  Decreased hearing.  Hears water in it.  Itches. Uses Qtips routinely.  Review of Systems No fever, chills, GI/GU symptoms.    Objective:   Physical Exam Vital signs noted. Well-developed, well nourished WM who is awake, alert and oriented, in NAD. HEENT: [place]/AT, PERRL, EOMI.  Sclera and conjunctiva are clear.  Movement of the right tragus and pinna causes discomfort. Left EAC is patent, right is noted with edema and erythema.  No drainage. TMs are normal in appearance, though the right TM cannot be visualized in its entirety. Nasal mucosa is pink and moist. OP is clear. Neck: supple, non-tender, no lymphadenopathy, thyromegaly. Heart: RRR, no murmur Lungs: CTA Extremities: no cyanosis, clubbing or edema. Skin: warm and dry without rash.        Assessment & Plan:   1. Otalgia of right ear    2. Otitis externa of right ear  ciprofloxacin-dexamethasone (CIPRODEX) otic suspension   Advised against the use of Qtips in the ear canal. Return if symptoms worsen or persist.

## 2012-05-30 ENCOUNTER — Telehealth: Payer: Self-pay | Admitting: Pulmonary Disease

## 2012-05-30 MED ORDER — PREDNISONE (PAK) 5 MG PO TABS: ORAL_TABLET | ORAL | Status: DC

## 2012-05-30 NOTE — Telephone Encounter (Signed)
Spoke with pt and notified of recs per SN Pt verbalized understanding.  Declines indocin at this time and states only wants pred taper Rx was sent.  Nothing further needed.

## 2012-05-30 NOTE — Telephone Encounter (Signed)
I spoke with pt and he stated he is having a gout attack right foot x yesterday. He has not tried anything OTC. He is requesting to have something called in for this. Please advise SN thanks  Allergies  Allergen Reactions  . Penicillins     REACTION: as a child--unsure of reaction

## 2012-05-30 NOTE — Telephone Encounter (Signed)
Per SN---ok to call in pred dosepak  5 mg  6 day pack and indocin 25  #30  1 po qid with food as needed for pain.  thanks

## 2012-09-04 ENCOUNTER — Telehealth: Payer: Self-pay | Admitting: Pulmonary Disease

## 2012-09-04 MED ORDER — METHYLPREDNISOLONE 4 MG PO KIT: PACK | ORAL | Status: DC

## 2012-09-04 MED ORDER — COLCHICINE 0.6 MG PO TABS: 0.6000 mg | ORAL_TABLET | Freq: Two times a day (BID) | ORAL | Status: DC

## 2012-09-04 NOTE — Telephone Encounter (Signed)
I spoke with pt and he c/o gout flare-left foot big toe x this morning. He is requesting colcrys. Please advise SN thanks Last OV 01/17/12 Pending OV 01/16/13 Allergies  Allergen Reactions  . Penicillins     REACTION: as a child--unsure of reaction

## 2012-09-04 NOTE — Telephone Encounter (Signed)
Per SN---call in medrol dose pak #1  Take as directed and ok to call in the colcrys  #30  1 po bid for gout flare.  thanks

## 2012-09-04 NOTE — Telephone Encounter (Signed)
Pt aware of recs from SN and aware that RX's sent to pharmacy.

## 2012-12-21 ENCOUNTER — Other Ambulatory Visit: Payer: Self-pay | Admitting: Pulmonary Disease

## 2013-01-08 ENCOUNTER — Telehealth: Payer: Self-pay | Admitting: Pulmonary Disease

## 2013-01-08 DIAGNOSIS — Z0000 Encounter for general adult medical examination without abnormal findings: Secondary | ICD-10-CM

## 2013-01-08 NOTE — Telephone Encounter (Signed)
Called, spoke with pt.   He is scheduled for CPX on 01/16/13 with SN. His last OV was on 01/17/12 with SN.  He would like to come prior to appt for labs. Pls advise which labs you would like pt to have.   Thank you.

## 2013-01-09 NOTE — Telephone Encounter (Signed)
Per SN---ok to have pt come in for labs prior to ov.  These orders have been placed for the pt . thanks

## 2013-01-09 NOTE — Telephone Encounter (Signed)
Pt is aware that orders have been placed. He will come in and those drawn before his appointment. Nothing further was needed.

## 2013-01-13 ENCOUNTER — Other Ambulatory Visit (INDEPENDENT_AMBULATORY_CARE_PROVIDER_SITE_OTHER): Payer: 59

## 2013-01-13 DIAGNOSIS — Z0000 Encounter for general adult medical examination without abnormal findings: Secondary | ICD-10-CM

## 2013-01-13 LAB — URINALYSIS
Nitrite: NEGATIVE
Specific Gravity, Urine: 1.02 (ref 1.000–1.030)
Total Protein, Urine: NEGATIVE
Urine Glucose: NEGATIVE
Urobilinogen, UA: 0.2 (ref 0.0–1.0)

## 2013-01-13 LAB — HEPATIC FUNCTION PANEL
ALT: 26 U/L (ref 0–53)
Alkaline Phosphatase: 93 U/L (ref 39–117)
Bilirubin, Direct: 0.1 mg/dL (ref 0.0–0.3)
Total Bilirubin: 0.9 mg/dL (ref 0.3–1.2)
Total Protein: 6.5 g/dL (ref 6.0–8.3)

## 2013-01-13 LAB — CBC WITH DIFFERENTIAL/PLATELET
Eosinophils Relative: 3.6 % (ref 0.0–5.0)
HCT: 46.2 % (ref 39.0–52.0)
Hemoglobin: 16.7 g/dL (ref 13.0–17.0)
Lymphs Abs: 1.3 10*3/uL (ref 0.7–4.0)
Monocytes Relative: 7.5 % (ref 3.0–12.0)
Neutro Abs: 3.8 10*3/uL (ref 1.4–7.7)
RBC: 5.54 Mil/uL (ref 4.22–5.81)
WBC: 5.8 10*3/uL (ref 4.5–10.5)

## 2013-01-13 LAB — BASIC METABOLIC PANEL
CO2: 28 mEq/L (ref 19–32)
Chloride: 104 mEq/L (ref 96–112)
Creatinine, Ser: 1.2 mg/dL (ref 0.4–1.5)
Sodium: 139 mEq/L (ref 135–145)

## 2013-01-13 LAB — LIPID PANEL
Total CHOL/HDL Ratio: 7
VLDL: 129.4 mg/dL — ABNORMAL HIGH (ref 0.0–40.0)

## 2013-01-13 LAB — LDL CHOLESTEROL, DIRECT: Direct LDL: 105.7 mg/dL

## 2013-01-16 ENCOUNTER — Encounter: Payer: Self-pay | Admitting: Pulmonary Disease

## 2013-01-16 ENCOUNTER — Ambulatory Visit (INDEPENDENT_AMBULATORY_CARE_PROVIDER_SITE_OTHER): Payer: BC Managed Care – PPO | Admitting: Pulmonary Disease

## 2013-01-16 ENCOUNTER — Ambulatory Visit (INDEPENDENT_AMBULATORY_CARE_PROVIDER_SITE_OTHER)
Admission: RE | Admit: 2013-01-16 | Discharge: 2013-01-16 | Disposition: A | Discharge status: Home / Self Care | Payer: BC Managed Care – PPO | Source: Ambulatory Visit | Attending: Pulmonary Disease | Admitting: Pulmonary Disease

## 2013-01-16 ENCOUNTER — Telehealth: Payer: Self-pay | Admitting: Pulmonary Disease

## 2013-01-16 VITALS — BP 122/82 | HR 73 | Temp 98.1°F | Ht 68.0 in | Wt 197.0 lb

## 2013-01-16 DIAGNOSIS — M545 Low back pain, unspecified: Secondary | ICD-10-CM

## 2013-01-16 DIAGNOSIS — M109 Gout, unspecified: Secondary | ICD-10-CM

## 2013-01-16 DIAGNOSIS — E785 Hyperlipidemia, unspecified: Secondary | ICD-10-CM

## 2013-01-16 DIAGNOSIS — Z0000 Encounter for general adult medical examination without abnormal findings: Secondary | ICD-10-CM

## 2013-01-16 DIAGNOSIS — T7840XA Allergy, unspecified, initial encounter: Secondary | ICD-10-CM

## 2013-01-16 DIAGNOSIS — M1990 Unspecified osteoarthritis, unspecified site: Secondary | ICD-10-CM

## 2013-01-16 DIAGNOSIS — E782 Mixed hyperlipidemia: Secondary | ICD-10-CM | POA: Insufficient documentation

## 2013-01-16 DIAGNOSIS — R002 Palpitations: Secondary | ICD-10-CM

## 2013-01-16 MED ORDER — FENOFIBRATE 160 MG PO TABS: 160.0000 mg | ORAL_TABLET | Freq: Every day | ORAL | Status: DC

## 2013-01-16 MED ORDER — SERTRALINE HCL 50 MG PO TABS: ORAL_TABLET | ORAL | Status: DC

## 2013-01-16 MED ORDER — ZOLPIDEM TARTRATE 10 MG PO TABS: ORAL_TABLET | ORAL | Status: DC

## 2013-01-16 MED ORDER — ROPINIROLE HCL 0.5 MG PO TABS: ORAL_TABLET | ORAL | Status: DC

## 2013-01-16 NOTE — Progress Notes (Addendum)
Subjective:    Patient ID: Dwayne Larsen, male    DOB: 07-02-1956, 57 y.o.   MRN: [id number]  HPI 57 y/o WM here for a follow up visit & CPX...  He is followed for general medical purposes with:  AR;  Hx palpit;  Hyperchol;  Hx elev PSA- ret to norm w/ antibiotic Rx;  DJD/ Gout;  Hx LBP...  ~  December 22, 2009:  GI symptoms diminished over the last month, back to ~baseline... otherw feeling well- no new complaints or concerns x Insomnia & would like to try AMBIEN Prn...  he had labs done 12/20/09 and review shows incr PSA to 4.96 (it was 0.71 last yr)... he specif denies any prostate manipulation, dysuria, pain, LTOS, etc... offered referral to Urology now, vs trial Antibiotic for several weeks & repeat PSA in 48mo (he prefers the latter & f/u PSA= 0.71)...  ~  December 28, 2010:  Yearly ROV & stable overall just c/o being tired, getting older, not resting well;  He goes to sleep about 11PM & usually awakes about 2-4AM w/ difficulty returning to sleep;  Usually awakes 7-8AM refreshed but sometimes like he didn't rest;  He may get sleepy in afternoon but never naps;  ?snoring (wife does but if he does it doesn't bother his wife);  Not a restless sleeper;  ESS= 8, & we discussed refill Ambien, try Melatonin, etc...  CXR, EKG, & recent fasting labs all look good x TG= 214 & we discussed better low fat diet...  ~  Jan 17, 2012:  Yearly ROV & CPX> Dwayne Larsen has had a good yr overall; no new complaints or concerns; his wife had Shingles & he wants Rx for the vaccine- written;  We reviewed his medical problems, medications, xrays & labs> see below>> EKG 5/13 showed NSR, rate60, WNL, NAD... LABS 4/13:  FLP- ok x TG=258 HDL=34;  Chems- wnl;  CBC- wnl;  TSH=2.17;  PSA=0.87  ~  Jan 16, 2013:  Yearly ROV & CPX> Dwayne Larsen has several concerns at this time>>    HxAR> prev pos allergy testing years ago; uses OTC antihist & netti pot as needed; this spring has been mild, doing satis w/o sneezing, congestion, etc...    LIPIDS> on  Simva20; Labs were done prior to this OV & he is shocked by his FLP: TChol 198, TG 647, HDL 30, LDL 106; he notes that weight is stable at 197# (BMI=30) & states he's been eating better than any time in the past; he was prev on Fenofibrate in ~2007 but he stopped on his own & since then TGs have been 122-299; we discussed Lipoprotein Lipase enz & metabolism of the long chain fatty acids, the role of low fat diet & exercise, & the need for FENOFIBRATE 160mg /d added to his Simva20; f/u labs w/ ROV 54mo...    DJD, GOUT, HxLBP, DISH> He saw Dwayne Larsen 1/14 w/ right knee pain & swelling; known hx DJD (prev arthroscopy ?2002 for meniscus tear) & Gout; he had arthrocentesis & cortisone shot- was told no uric crystals & stopped Colchicine in favor of Meloxicam & improved; he denies back pain issues at present (s/p surg 2003); routine CXR 5/14 shows DISH w/ progressive changes compared to 2010, 2012 => we will refer to Rheum for their review...    ?RLS, leg movement> new symptom- He describes some type of ?RLS but more during the day, has to move or shake his leg like a nervous habit, occurs less at night &  he/ wife don't notice it in bed, doesn't effect his sleep; I mentioned that this symptom may also improve w/ the Sertraline Rx (see below) but we wrote for REQUIP 0.5mg  to try prn...     Unusual symptoms> He notes unusual feeling of moodiness, short tempered, out of sorts- x 36mo & no know ppt event; no concerns at home or work, new grandchild now 76mo old & doing fine (lives [place]); also notes occas episodes of SOB "like I can't get a deep breath" & occas episodes of globus sensation; we reviewed the poss origin of these symptoms in a chemical imbalance & chest wall/ throat muscle spasms & offered med rx vs counseling- he prefers trial of the former first & we decided on SERTRALINE 50=>100mg /d (he will call in 1 mo to go over his symptoms & response to med)...     We reviewed prob list, meds, xrays and labs> see below  for updates >>  EKG done 5/13 and showed NSR, rate60, WNL, NAD... CXR 5/14 showed normal heart size, clear lungs, degen changes in Tspine w/ diffuse idiopathic skeletal hyperostosis (DISH) LABS 4/14:  FLP- not at goal w/ TG=647 7 we added Feno160 to his Simva20;  Chems- wnl;  CBC- wnl;  TSH=2.55;  PSA=1.21;  UA- clear...         Problems List:    ALLERGY (ICD-995.3) - uses OTC allergy meds as needed + "netti pot"... he had pos testing by [facility] in 1999 w/ sens to pollen, trees, weeds (prev on immunotherapy & he felt that he was improved while on the shots, now symptoms slowly increasing off the shots)... he has received Depo shots for severe spring allergy symptoms in the past.  Hx of PALPITATIONS (ICD-785.1) - on ASA 81mg /d... ~  cardiac eval by Delton See in 2004 was neg. ~  baseline EKG w/ NSR, WNL...  ~  NuclearStressTest 11/04 was neg for scar or ischemia, EF=56%... ~  4/12:  BP= 124/74 & he denies CP, palpit, dizzy, SOB, edema, etc... ~  5/13:  BP= 130/88 & he remains asymptomatic... ~  5/14:  BP= 122/82 & he denies CP, palpit, SOB/ DOE, edema...  HYPERCHOLESTEROLEMIA (ICD-272.0) - on ZOCOR 20mg  daily... TRICOR was added to his regimen in 2007 but he stopped this on his own...  ~  FLP 3/08 on Simva20/ Tricor showed TChol 152, TG 99 (down from 444), HDL 40, LDL 93... same. ~  FLP 4/09 on Simva20 showed TChol 184, TG 245, HDL 38, LDL 108... he didn't want to restart Fibrate. ~  FLP 3/10 on Simva20 showed TChol 145, TG 122, HDL 34, LDL 87... improved. ~  FLP 4/11 on Simva20 showed TChol 128, TG 299, HDL 34, LDL 54... needs better low fat diet. ~  FLP 4/12 on Simva20 QOD (he cut back on his own) showed TChol 182, TG 214, HDL 38, LDL 108... rec incr back to daily. ~  FLP 4/13 on Simva20 showed TChol 151, TG 258, HDL 34, LDL 73 ~  FLP 4/14 on Simva20 showed TChol 198, TG 647, HDL 30, LDL 106... We discussed adding FENOFIBRATE160mg /d...  ABDOMINAL PAIN, LEFT LOWER QUADRANT (ICD-789.04) - see eval  3/11  Hx ELEVATED PROSTATE SPECIFIC ANTIGEN (ICD-790.93) ~  labs 3/10 showed PSA= 0.71 ~  labs 4/11 showed PSA= 4.96 & DRE is benign- normal feeling gland... he opted for trial antibiotic Rx & repeat lab ==> 1.42 ~  Labs 4/12 showed PSA= 1.04 ~  Labs 4/13 showed PSA= 0.87 ~  Labs  4/14 showed PSA= 1.21  DEGENERATIVE JOINT DISEASE (ICD-715.90) - he is s/p arthroscopic surg both knees by Dwayne Larsen in ?2002- we don't have records... GOUT (ICD-274.9) - Uric acid level 4/10 = 8.5 & pt was offered Allopurinol Rx, but he preferred to control this on a low purine diet... Hx of BACK PAIN, LUMBAR (ICD-724.2) - he is s/p L4-5 lumbar laminotomy & microdiscectomy 2003 by DrNudelman... DIFFUSE IDIOPATHIC SKELETAL HYPEROSTOSIS (DISH) >> notes on lat CXR 5/14, there appears to be marked change from 2010, 2012... ~  Prev on Advil/ Aleve OTC;  then treated w/ Colchicine per Dwayne Larsen;  now on MELOXICAM per Ortho... ~  1/14:  He saw Dwayne Larsen 1/14 w/ right knee pain & swelling- arthrocentesis & cortisone shot given- told no uric crystals & stopped Colchicine in favor of Meloxicam & improved; he denies back pain issues... ~  5/14:  CXR shows DISH & prominent changes from prev films 2010, 2012; he has min back symptoms- we will refer to rheum for their review...   HEALTH MAINTENANCE:   ~  GI = DrMedoff w/ flex sig 7/93 = neg; Colonoscopy 4/08 showing rare divertics, no polyps...  ~  GU = 4/11 OV w/ norm DRE & sudden rise in PSA to 4.96... trial Doxy & f/u PSA 23mo= 1.42... PSA remained wnl since then. ~  Immunizations:  ?last Tetanus shot;  he has not been inclined to get the seasonal flu vaccines;  He requested Rx for Shingles vax after wife had an outbreak.   Past Surgical History  Procedure Laterality Date  . Knee arthroscopy      right  . Laminectomy and microdiscectomy lumbar spine  2003    L4-5  . Rectal surgery  2003    Dr. Orson Slick    Outpatient Encounter Prescriptions as of 01/16/2013  Medication Sig  Dispense Refill  . aspirin 81 MG tablet Take 81 mg by mouth daily.        . colchicine 0.6 MG tablet Take 1 tablet (0.6 mg total) by mouth 2 (two) times daily.  30 tablet  0  . Multiple Vitamin (MULTIVITAMIN) tablet Take 1 tablet by mouth daily.        . simvastatin (ZOCOR) 20 MG tablet Take 1 tablet (20 mg total) by mouth at bedtime.  90 tablet  3  . zolpidem (AMBIEN) 10 MG tablet Take one tablet at bedtime as directed  30 tablet  5  . [DISCONTINUED] methylPREDNISolone (MEDROL, PAK,) 4 MG tablet follow package directions  21 tablet  0  . [DISCONTINUED] predniSONE (STERAPRED UNI-PAK) 5 MG TABS 6 day taper as directed  21 each  0  . [DISCONTINUED] simvastatin (ZOCOR) 20 MG tablet TAKE 1 TABLET BY MOUTH EVERY DAY  90 tablet  0   No facility-administered encounter medications on file as of 01/16/2013.    Allergies  Allergen Reactions  . Penicillins     REACTION: as a child--unsure of reaction    Review of Systems       The patient denies fever, chills, sweats, anorexia, fatigue, weakness, malaise, weight loss, sleep disorder, blurring, diplopia, eye irritation, eye discharge, vision loss, eye pain, photophobia, earache, ear discharge, tinnitus, decreased hearing, nasal congestion, nosebleeds, sore throat, hoarseness, chest pain, palpitations, syncope, dyspnea on exertion, orthopnea, PND, peripheral edema, cough, dyspnea at rest, excessive sputum, hemoptysis, wheezing, pleurisy, nausea, vomiting, diarrhea, constipation, change in bowel habits, abdominal pain, melena, hematochezia, jaundice, gas/bloating, indigestion/heartburn, dysphagia, odynophagia, dysuria, hematuria, urinary frequency, urinary hesitancy, nocturia, incontinence, back pain, joint  pain, joint swelling, muscle cramps, muscle weakness, stiffness, arthritis, sciatica, restless legs, leg pain at night, leg pain with exertion, rash, itching, dryness, suspicious lesions, paralysis, paresthesias, seizures, tremors, vertigo, transient  blindness, frequent falls, frequent headaches, difficulty walking, depression, anxiety, memory loss, confusion, cold intolerance, heat intolerance, polydipsia, polyphagia, polyuria, unusual weight change, abnormal bruising, bleeding, enlarged lymph nodes, urticaria, allergic rash, hay fever, and recurrent infections.     Objective:   Physical Exam     WD, WN, 56 y/o WM in NAD... GENERAL:  Alert & oriented; pleasant & cooperative... HEENT:  [place]/AT, EOM-wnl, PERRLA, Fundi-benign, EACs-clear, TMs-wnl, NOSE-clear, THROAT-clear & wnl. NECK:  Supple w/ full ROM; no JVD; normal carotid impulses w/o bruits; no thyromegaly or nodules palpated; no lymphadenopathy. CHEST:  Clear to P & A; without wheezes/ rales/ or rhonchi. HEART:  Regular Rhythm; without murmurs/ rubs/ or gallops. ABDOMEN:  Soft & nontender; normal bowel sounds; no organomegaly or masses detected; no hernia palpated. RECTAL:  decr sphincter tone (prev surg), prostate feels normal= 2+size, no nodules; stool heme neg... EXT: without deformities or arthritic changes; no varicose veins/ venous insuffic/ or edema. NEURO:  CN's intact; motor testing normal; sensory testing normal; gait normal & balance OK. DERM:  No lesions noted; no rash etc...  RADIOLOGY DATA:  Reviewed in the EPIC EMR & discussed w/ the patient...  LABORATORY DATA:  Reviewed in the EPIC EMR & discussed w/ the patient...   Assessment & Plan:    CPX>  Good general heath> but TG is elev & we are adding Feno160; also notes unusual moodiness, intermittent dyspnea & globus- try Sertraline 50=>100 trial...  AR>  Stable w/ OTC meds as needed...  HYPERLIPID>  TGs are up to 647 & we decided to add Feno160 to his simva20; f/u FLP in 3 months...  DJD/ GOUT>  Acute arthritic episode 1/14 w/ arthrocentesis & no Uric crystals seen; improved w/ shot & Meloxicam...  HxLBP w/ LLam/ DISH>  s/p L4-5 lumbar laminotomy & microdiscectomy 2003 by DrNudelman; CXR 5/14 shows DISH & we  will refer to rheum for their review...  ?RLS>  New symptom 4/14 but atypical; trial Requip 0.5mg  as needed...  ?Mood Disorder>  New symptom 4/14 & we discussed chemical imbalance, rec trial SERTRALINE 50=>100mg  & look for response  Other medical issues as noted...   Patient's Medications  New Prescriptions   FENOFIBRATE 160 MG TABLET    Take 1 tablet (160 mg total) by mouth daily.   ROPINIROLE (REQUIP) 0.5 MG TABLET    Take one tablet by mouth daily for RLS   SERTRALINE (ZOLOFT) 50 MG TABLET    Take 1 tablet daily x 3 weeks then increase to 2 tablets daily thereafter  Previous Medications   ASPIRIN 81 MG TABLET    Take 81 mg by mouth daily.     COLCHICINE 0.6 MG TABLET    Take 1 tablet (0.6 mg total) by mouth 2 (two) times daily.   MULTIPLE VITAMIN (MULTIVITAMIN) TABLET    Take 1 tablet by mouth daily.     SIMVASTATIN (ZOCOR) 20 MG TABLET    Take 1 tablet (20 mg total) by mouth at bedtime.  Modified Medications   Modified Medication Previous Medication   ZOLPIDEM (AMBIEN) 10 MG TABLET zolpidem (AMBIEN) 10 MG tablet      Take one tablet at bedtime as directed    Take one tablet at bedtime as directed  Discontinued Medications   METHYLPREDNISOLONE (MEDROL, PAK,) 4 MG TABLET  follow package directions   PREDNISONE (STERAPRED UNI-PAK) 5 MG TABS    6 day taper as directed   SIMVASTATIN (ZOCOR) 20 MG TABLET    TAKE 1 TABLET BY MOUTH EVERY DAY

## 2013-01-16 NOTE — Telephone Encounter (Signed)
I spoke with pt and is aware we will call him once we have labs placed in EPIC. Please advise SN thanks Last OV 01/16/13 Pending 04/21/13

## 2013-01-16 NOTE — Patient Instructions (Addendum)
Today we updated your med list in our EPIC system...    Continue your current medications the same...    We refilled your meds as requested...  For the change in mood etc>>    Let's try SERTRALINE 50mg  one tab daily for 3-4 weeks and plan to increase to 2 tabs (100mg ) at that time       Please give me a call & let me know how you are feeling on this med before the increase...    We can consider counseling anywhere along the way if you feel it would be helpful to you...  For the Restless leg syndrome>>    Let's start Rx w/ REQUIP 0.5mg  as needed & see how you respond...    You may need a larger dose (double it to 1.0mg ) or you may need to take it more often (ie- twice daily)...  For the elev triglycerides>>    Continue your low fat diet 7 exercise program...    We are starting FENOFIBRATE 160mg  once daily along w/ your Simvastatin...    Let's plan a follow up FLP in about 3 months...   Call for any questions...  Let's plan a follow up visit in several months to check your progress, sooner if needed for problems.Marland KitchenMarland Kitchen

## 2013-01-17 MED ORDER — GEMFIBROZIL 600 MG PO TABS: 600.0000 mg | ORAL_TABLET | Freq: Two times a day (BID) | ORAL | Status: DC

## 2013-01-17 NOTE — Telephone Encounter (Signed)
Called and spoke with pt and he is aware of med change from the fenofibrate to the gemfib. 600 mg  1 po bid.  This has been sent in to the pts pharmacy for 90 day supply.  Pt is aware that order has been placed for pt to be seen by Rheumatology.  Nothing further is needed.

## 2013-01-17 NOTE — Telephone Encounter (Signed)
Labs have been placed in the computer for pt to get these done prior to his appt in august and pt is aware.  Pt stated that the fenofibrate is very expensive(145) and he stated that the pharmacy was going to contact SN to see if there was an alternative to this.  SN please advise. Thanks  Allergies  Allergen Reactions  . Penicillins     REACTION: as a child--unsure of reaction

## 2013-01-17 NOTE — Telephone Encounter (Signed)
Returning call can be reached at [phone].Dwayne Larsen

## 2013-03-10 ENCOUNTER — Telehealth: Payer: Self-pay | Admitting: Pulmonary Disease

## 2013-03-10 NOTE — Telephone Encounter (Signed)
Error.Dwayne Larsen ° °

## 2013-03-10 NOTE — Telephone Encounter (Signed)
5.1.14 xray results printed and faxed to the number above Called [facility], spoke with [facility] and informed her of the above Dorathy Daft also stated that they may be able to view this thru epic. Nothing further needed; will sign off.

## 2013-04-09 ENCOUNTER — Other Ambulatory Visit: Payer: Self-pay | Admitting: Pulmonary Disease

## 2013-04-14 ENCOUNTER — Telehealth: Payer: Self-pay | Admitting: Pulmonary Disease

## 2013-04-14 NOTE — Telephone Encounter (Signed)
No further labs are needed thanks

## 2013-04-14 NOTE — Telephone Encounter (Signed)
Pt already has labs in epic for lipid, hepatic, and BMET. Any other labs needs to be ordered? Please advise SN THANKS Last OV 5/1/4 Pending 04/21/13 w/ LABS

## 2013-04-14 NOTE — Telephone Encounter (Signed)
lmomtcb x1 for pt 

## 2013-04-14 NOTE — Telephone Encounter (Signed)
Pt advised labs ordered.Carron Curie, CMA

## 2013-04-15 ENCOUNTER — Other Ambulatory Visit (INDEPENDENT_AMBULATORY_CARE_PROVIDER_SITE_OTHER): Payer: BC Managed Care – PPO

## 2013-04-15 DIAGNOSIS — E785 Hyperlipidemia, unspecified: Secondary | ICD-10-CM

## 2013-04-15 DIAGNOSIS — M545 Low back pain, unspecified: Secondary | ICD-10-CM

## 2013-04-15 DIAGNOSIS — R002 Palpitations: Secondary | ICD-10-CM

## 2013-04-15 DIAGNOSIS — M1990 Unspecified osteoarthritis, unspecified site: Secondary | ICD-10-CM

## 2013-04-15 LAB — BASIC METABOLIC PANEL
BUN: 17 mg/dL (ref 6–23)
Calcium: 9.3 mg/dL (ref 8.4–10.5)
Creatinine, Ser: 1.1 mg/dL (ref 0.4–1.5)
GFR: 75.59 mL/min (ref >60.00)

## 2013-04-15 LAB — HEPATIC FUNCTION PANEL
ALT: 29 U/L (ref 0–53)
Alkaline Phosphatase: 97 U/L (ref 39–117)
Bilirubin, Direct: 0.1 mg/dL (ref 0.0–0.3)
Total Protein: 7.1 g/dL (ref 6.0–8.3)

## 2013-04-15 LAB — LIPID PANEL
Cholesterol: 141 mg/dL (ref 0–200)
VLDL: 32.6 mg/dL (ref 0.0–40.0)

## 2013-04-21 ENCOUNTER — Encounter: Payer: Self-pay | Admitting: Pulmonary Disease

## 2013-04-21 ENCOUNTER — Ambulatory Visit (INDEPENDENT_AMBULATORY_CARE_PROVIDER_SITE_OTHER): Payer: BC Managed Care – PPO | Admitting: Pulmonary Disease

## 2013-04-21 VITALS — BP 132/76 | HR 60 | Temp 98.0°F | Ht 68.0 in | Wt 192.4 lb

## 2013-04-21 DIAGNOSIS — E785 Hyperlipidemia, unspecified: Secondary | ICD-10-CM

## 2013-04-21 DIAGNOSIS — M4810 Ankylosing hyperostosis [Forestier], site unspecified: Secondary | ICD-10-CM

## 2013-04-21 DIAGNOSIS — M545 Low back pain, unspecified: Secondary | ICD-10-CM

## 2013-04-21 DIAGNOSIS — M1990 Unspecified osteoarthritis, unspecified site: Secondary | ICD-10-CM

## 2013-04-21 NOTE — Progress Notes (Signed)
Subjective:    Patient ID: Dwayne Larsen, male    DOB: 27-Apr-1956, 57 y.o.   MRN: [id number]  HPI 57 y/o WM here for a follow up visit & CPX...  He is followed for general medical purposes with:  AR;  Hx palpit;  Hyperchol;  Hx elev PSA- ret to norm w/ antibiotic Rx;  DJD/ Gout;  Hx LBP...  ~  Jan 17, 2012:  Yearly ROV & CPX> Dwayne Larsen has had a good yr overall; no new complaints or concerns; his wife had Shingles & he wants Rx for the vaccine- written;  We reviewed his medical problems, medications, xrays & labs> see below>> EKG 5/13 showed NSR, rate60, WNL, NAD... LABS 4/13:  FLP- ok x TG=258 HDL=34;  Chems- wnl;  CBC- wnl;  TSH=2.17;  PSA=0.87  ~  Jan 16, 2013:  Yearly ROV & CPX> Dwayne Larsen has several concerns at this time>>    HxAR> prev pos allergy testing years ago; uses OTC antihist & netti pot as needed; this spring has been mild, doing satis w/o sneezing, congestion, etc...    LIPIDS> on Simva20; Labs were done prior to this OV & he is shocked by his FLP: TChol 198, TG 647, HDL 30, LDL 106; he notes that weight is stable at 197# (BMI=30) & states he's been eating better than any time in the past; he was prev on Fenofibrate in ~2007 but he stopped on his own & since then TGs have been 122-299; we discussed Lipoprotein Lipase enz & metabolism of the long chain fatty acids, the role of low fat diet & exercise, & the need for FENOFIBRATE 160mg /d added to his Simva20; f/u labs w/ ROV 30mo...    DJD, GOUT, HxLBP, DISH> He saw DrWainer 1/14 w/ right knee pain & swelling; known hx DJD (prev arthroscopy ?2002 for meniscus tear) & Gout; he had arthrocentesis & cortisone shot- was told no uric crystals & stopped Colchicine in favor of Meloxicam & improved; he denies back pain issues at present (s/p surg 2003); routine CXR 5/14 shows DISH w/ progressive changes compared to 2010, 2012 => we will refer to Rheum for their review...    ?RLS, leg movement> new symptom- He describes some type of ?RLS but more during the  day, has to move or shake his leg like a nervous habit, occurs less at night & he/ wife don't notice it in bed, doesn't effect his sleep; I mentioned that this symptom may also improve w/ the Sertraline Rx (see below) but we wrote for REQUIP 0.5mg  to try prn...     Unusual symptoms> He notes unusual feeling of moodiness, short tempered, out of sorts- x 6mo & no know ppt event; no concerns at home or work, new grandchild now 5mo old & doing fine (lives [place]); also notes occas episodes of SOB "like I can't get a deep breath" & occas episodes of globus sensation; we reviewed the poss origin of these symptoms in a chemical imbalance & chest wall/ throat muscle spasms & offered med rx vs counseling- he prefers trial of the former first & we decided on SERTRALINE 50=>100mg /d (he will call in 1 mo to go over his symptoms & response to med)...     We reviewed prob list, meds, xrays and labs> see below for updates >>  EKG done 5/13 and showed NSR, rate60, WNL, NAD... CXR 5/14 showed normal heart size, clear lungs, degen changes in Tspine w/ diffuse idiopathic skeletal hyperostosis (DISH) LABS 4/14:  FLP- not  at goal w/ TG=647 7 we added Feno160 to his Simva20;  Chems- wnl;  CBC- wnl;  TSH=2.55;  PSA=1.21;  UA- clear...  ~  April 21, 2013:  81mo ROV & there were mult issues last visit all addressed as below>>    LIPIDS> on Simva20 & Chol is ok but last visit TG=647 & we started Feno160 but too $$ so switched to Lopid600Bid; tol well and f/u FLP now showed TChol 141, TG 163, HDL 34, LDL 74; much improved- continue same...    DISH> his CXR showed DISH & he was referred to rheum for their opinion; he tells me that that appt didn't go so well, DrDeveshwar didn't have our data & her only recommendations were for PT, yoga, etc...    RLS> we gave him a trial of Requip 0.5mg /d but he says that even after 1-2 doses he didn't feel well therefore stopped it 7 doesn't want to try other Rx...    Moodiness> this was mentioned  last OV so we tried Zoloft50 w/ rec to incr to 100mg /d if needed; he says this does help but he only needs it prn & not regularly... We reviewed prob list, meds, xrays and labs> see below for updates >>  LABS 7/14:  FLP- improved on simva20+LopidBid;  Chems- wnl...         Problems List:    ALLERGY (ICD-995.3) - uses OTC allergy meds as needed + "netti pot"... he had pos testing by [facility] in 1999 w/ sens to pollen, trees, weeds (prev on immunotherapy & he felt that he was improved while on the shots, now symptoms slowly increasing off the shots)... he has received Depo shots for severe spring allergy symptoms in the past.  Hx of PALPITATIONS (ICD-785.1) - on ASA 81mg /d... ~  cardiac eval by Delton See in 2004 was neg. ~  baseline EKG w/ NSR, WNL...  ~  NuclearStressTest 11/04 was neg for scar or ischemia, EF=56%... ~  4/12:  BP= 124/74 & he denies CP, palpit, dizzy, SOB, edema, etc... ~  5/13:  BP= 130/88 & he remains asymptomatic... ~  5/14:  BP= 122/82 & he denies CP, palpit, SOB/ DOE, edema...  HYPERCHOLESTEROLEMIA (ICD-272.0) - on ZOCOR 20mg  daily... TRICOR was added to his regimen in 2007 but he stopped this on his own...  ~  FLP 3/08 on Simva20/ Tricor showed TChol 152, TG 99 (down from 444), HDL 40, LDL 93... same. ~  FLP 4/09 on Simva20 showed TChol 184, TG 245, HDL 38, LDL 108... he didn't want to restart Fibrate. ~  FLP 3/10 on Simva20 showed TChol 145, TG 122, HDL 34, LDL 87... improved. ~  FLP 4/11 on Simva20 showed TChol 128, TG 299, HDL 34, LDL 54... needs better low fat diet. ~  FLP 4/12 on Simva20 QOD (he cut back on his own) showed TChol 182, TG 214, HDL 38, LDL 108... rec incr back to daily. ~  FLP 4/13 on Simva20 showed TChol 151, TG 258, HDL 34, LDL 73 ~  FLP 4/14 on Simva20 showed TChol 198, TG 647, HDL 30, LDL 106... We discussed adding FENOFIBRATE160mg /d=> later ch to LOPID600Bid... ~  FLP 7/14 on Simva20+Lopid600Bid showed TChol 141, TG 163, HDL 34, LDL 74;  ABDOMINAL  PAIN, LEFT LOWER QUADRANT (ICD-789.04) - see eval 3/11  Hx ELEVATED PROSTATE SPECIFIC ANTIGEN (ICD-790.93) ~  labs 3/10 showed PSA= 0.71 ~  labs 4/11 showed PSA= 4.96 & DRE is benign- normal feeling gland... he opted for trial antibiotic Rx &  repeat lab ==> 1.42 ~  Labs 4/12 showed PSA= 1.04 ~  Labs 4/13 showed PSA= 0.87 ~  Labs 4/14 showed PSA= 1.21  DEGENERATIVE JOINT DISEASE (ICD-715.90) - he is s/p arthroscopic surg both knees by DrWainer in ?2002- we don't have records... GOUT (ICD-274.9) - Uric acid level 4/10 = 8.5 & pt was offered Allopurinol Rx, but he preferred to control this on a low purine diet... Hx of BACK PAIN, LUMBAR (ICD-724.2) - he is s/p L4-5 lumbar laminotomy & microdiscectomy 2003 by DrNudelman... DIFFUSE IDIOPATHIC SKELETAL HYPEROSTOSIS (DISH) >> notes on lat CXR 5/14, there appears to be marked change from 2010, 2012... ~  Prev on Advil/ Aleve OTC;  then treated w/ Colchicine per DrWainer;  now on MELOXICAM per Ortho... ~  1/14:  He saw DrWainer 1/14 w/ right knee pain & swelling- arthrocentesis & cortisone shot given- told no uric crystals & stopped Colchicine in favor of Meloxicam & improved; he denies back pain issues... ~  5/14:  CXR shows DISH & prominent changes from prev films 2010, 2012; he has min back symptoms- we will refer to rheum for their review...   HEALTH MAINTENANCE:   ~  GI = DrMedoff w/ flex sig 7/93 = neg; Colonoscopy 4/08 showing rare divertics, no polyps...  ~  GU = 4/11 OV w/ norm DRE & sudden rise in PSA to 4.96... trial Doxy & f/u PSA 59mo= 1.42... PSA remained wnl since then. ~  Immunizations:  ?last Tetanus shot;  he has not been inclined to get the seasonal flu vaccines;  He requested Rx for Shingles vax after wife had an outbreak.   Past Surgical History  Procedure Laterality Date  . Knee arthroscopy      right  . Laminectomy and microdiscectomy lumbar spine  2003    L4-5  . Rectal surgery  2003    Dr. Orson Slick    Outpatient  Encounter Prescriptions as of 04/21/2013  Medication Sig Dispense Refill  . aspirin 81 MG tablet Take 81 mg by mouth daily.        Marland Kitchen COLCRYS 0.6 MG tablet TAKE 1 TABLET (0.6 MG TOTAL) BY MOUTH 2 (TWO) TIMES DAILY.  30 tablet  5  . gemfibrozil (LOPID) 600 MG tablet Take 1 tablet (600 mg total) by mouth 2 (two) times daily before a meal.  180 tablet  3  . Multiple Vitamin (MULTIVITAMIN) tablet Take 1 tablet by mouth daily.        . sertraline (ZOLOFT) 50 MG tablet Take 1 tablet daily x 3 weeks then increase to 2 tablets daily thereafter  60 tablet  5  . simvastatin (ZOCOR) 20 MG tablet Take 1 tablet (20 mg total) by mouth at bedtime.  90 tablet  3  . zolpidem (AMBIEN) 10 MG tablet Take one tablet at bedtime as directed  30 tablet  5  . [DISCONTINUED] fenofibrate 160 MG tablet Take 1 tablet (160 mg total) by mouth daily.  90 tablet  3  . [DISCONTINUED] rOPINIRole (REQUIP) 0.5 MG tablet Take one tablet by mouth daily for RLS  30 tablet  5  . [DISCONTINUED] simvastatin (ZOCOR) 20 MG tablet TAKE 1 TABLET EVERY DAY  90 tablet  1   No facility-administered encounter medications on file as of 04/21/2013.    Allergies  Allergen Reactions  . Penicillins     REACTION: as a child--unsure of reaction    Review of Systems       The patient denies fever, chills, sweats, anorexia,  fatigue, weakness, malaise, weight loss, sleep disorder, blurring, diplopia, eye irritation, eye discharge, vision loss, eye pain, photophobia, earache, ear discharge, tinnitus, decreased hearing, nasal congestion, nosebleeds, sore throat, hoarseness, chest pain, palpitations, syncope, dyspnea on exertion, orthopnea, PND, peripheral edema, cough, dyspnea at rest, excessive sputum, hemoptysis, wheezing, pleurisy, nausea, vomiting, diarrhea, constipation, change in bowel habits, abdominal pain, melena, hematochezia, jaundice, gas/bloating, indigestion/heartburn, dysphagia, odynophagia, dysuria, hematuria, urinary frequency, urinary  hesitancy, nocturia, incontinence, back pain, joint pain, joint swelling, muscle cramps, muscle weakness, stiffness, arthritis, sciatica, restless legs, leg pain at night, leg pain with exertion, rash, itching, dryness, suspicious lesions, paralysis, paresthesias, seizures, tremors, vertigo, transient blindness, frequent falls, frequent headaches, difficulty walking, depression, anxiety, memory loss, confusion, cold intolerance, heat intolerance, polydipsia, polyphagia, polyuria, unusual weight change, abnormal bruising, bleeding, enlarged lymph nodes, urticaria, allergic rash, hay fever, and recurrent infections.     Objective:   Physical Exam     WD, WN, 57 y/o WM in NAD... GENERAL:  Alert & oriented; pleasant & cooperative... HEENT:  [place]/AT, EOM-wnl, PERRLA, Fundi-benign, EACs-clear, TMs-wnl, NOSE-clear, THROAT-clear & wnl. NECK:  Supple w/ full ROM; no JVD; normal carotid impulses w/o bruits; no thyromegaly or nodules palpated; no lymphadenopathy. CHEST:  Clear to P & A; without wheezes/ rales/ or rhonchi. HEART:  Regular Rhythm; without murmurs/ rubs/ or gallops. ABDOMEN:  Soft & nontender; normal bowel sounds; no organomegaly or masses detected; no hernia palpated. RECTAL:  decr sphincter tone (prev surg), prostate feels normal= 2+size, no nodules; stool heme neg... EXT: without deformities or arthritic changes; no varicose veins/ venous insuffic/ or edema. NEURO:  CN's intact; motor testing normal; sensory testing normal; gait normal & balance OK. DERM:  No lesions noted; no rash etc...  RADIOLOGY DATA:  Reviewed in the EPIC EMR & discussed w/ the patient...  LABORATORY DATA:  Reviewed in the EPIC EMR & discussed w/ the patient...   Assessment & Plan:    CPX>  Good general heath> but TG is elev & we are adding Feno160; also notes unusual moodiness, intermittent dyspnea & globus- try Sertraline 50=>100 trial...  AR>  Stable w/ OTC meds as needed...  HYPERLIPID>  TGs are up to 647 &  we decided to add Feno160=> switched to LOPID600Bid due to cost & f/u FLP much improved; continue same...  DJD/ GOUT>  Acute arthritic episode 1/14 w/ arthrocentesis & no Uric crystals seen; improved w/ shot & Meloxicam...  HxLBP w/ LLam/ DISH>  s/p L4-5 lumbar laminotomy & microdiscectomy 2003 by Lenon Oms; CXR 5/14 shows DISH & he saw DrDeveshwar for her opinion=> rec for PT, Yoga etc...  ?RLS>  New symptom 4/14 but atypical; trial Requip 0.5mg  but he didn't like how it made him feel therefore stopped by pt...  ?Mood Disorder>  New symptom 4/14 & we discussed chemical imbalance, rec trial SERTRALINE 50 which apparently helped but only needed prn he says...  Other medical issues as noted...   Patient's Medications  New Prescriptions   No medications on file  Previous Medications   ASPIRIN 81 MG TABLET    Take 81 mg by mouth daily.     COLCRYS 0.6 MG TABLET    TAKE 1 TABLET (0.6 MG TOTAL) BY MOUTH 2 (TWO) TIMES DAILY.   GEMFIBROZIL (LOPID) 600 MG TABLET    Take 1 tablet (600 mg total) by mouth 2 (two) times daily before a meal.   MULTIPLE VITAMIN (MULTIVITAMIN) TABLET    Take 1 tablet by mouth daily.     SERTRALINE (ZOLOFT)  50 MG TABLET    Take 1 tablet daily x 3 weeks then increase to 2 tablets daily thereafter   SIMVASTATIN (ZOCOR) 20 MG TABLET    Take 1 tablet (20 mg total) by mouth at bedtime.   ZOLPIDEM (AMBIEN) 10 MG TABLET    Take one tablet at bedtime as directed  Modified Medications   No medications on file  Discontinued Medications   FENOFIBRATE 160 MG TABLET    Take 1 tablet (160 mg total) by mouth daily.   ROPINIROLE (REQUIP) 0.5 MG TABLET    Take one tablet by mouth daily for RLS   SIMVASTATIN (ZOCOR) 20 MG TABLET    TAKE 1 TABLET EVERY DAY

## 2013-04-21 NOTE — Patient Instructions (Addendum)
Today we updated your med list in our EPIC system...    Continue your current medications the same...  We reviewed your recent fasting blood work & gave you a copy for your records...  Keep up the good work w/ diet 7 exercise...  Let's plan a follow up visit in May 2015 for your physical, sooner if needed for problems.Marland KitchenMarland Kitchen

## 2013-07-01 ENCOUNTER — Ambulatory Visit (INDEPENDENT_AMBULATORY_CARE_PROVIDER_SITE_OTHER): Payer: BC Managed Care – PPO | Admitting: Adult Health

## 2013-07-01 ENCOUNTER — Other Ambulatory Visit (INDEPENDENT_AMBULATORY_CARE_PROVIDER_SITE_OTHER): Payer: BC Managed Care – PPO

## 2013-07-01 ENCOUNTER — Encounter: Payer: Self-pay | Admitting: Adult Health

## 2013-07-01 VITALS — BP 124/68 | HR 69 | Temp 97.2°F | Ht 68.0 in | Wt 190.8 lb

## 2013-07-01 DIAGNOSIS — N400 Enlarged prostate without lower urinary tract symptoms: Secondary | ICD-10-CM

## 2013-07-01 DIAGNOSIS — R300 Dysuria: Secondary | ICD-10-CM

## 2013-07-01 LAB — URINALYSIS, ROUTINE W REFLEX MICROSCOPIC
Bilirubin Urine: NEGATIVE
Hgb urine dipstick: NEGATIVE
Leukocytes, UA: NEGATIVE
Nitrite: NEGATIVE

## 2013-07-01 MED ORDER — TAMSULOSIN HCL 0.4 MG PO CAPS: 0.4000 mg | ORAL_CAPSULE | Freq: Every day | ORAL | Status: DC

## 2013-07-01 NOTE — Progress Notes (Signed)
Subjective:    Patient ID: Dwayne Larsen, male    DOB: [birth year], 57 y.o.   MRN: [id number]  HPI 57 y/o WM  He is followed for general medical purposes with:  AR;  Hx palpit;  Hyperchol;  Hx elev PSA- ret to norm w/ antibiotic Rx;  DJD/ Gout;  Hx LBP...  ~  April 21, 2013:  56mo ROV & there were mult issues last visit all addressed as below>>    LIPIDS> on Simva20 & Chol is ok but last visit TG=647 & we started Feno160 but too $$ so switched to Lopid600Bid; tol well and f/u FLP now showed TChol 141, TG 163, HDL 34, LDL 74; much improved- continue same...    DISH> his CXR showed DISH & he was referred to rheum for their opinion; he tells me that that appt didn't go so well, DrDeveshwar didn't have our data & her only recommendations were for PT, yoga, etc...    RLS> we gave him a trial of Requip 0.5mg /d but he says that even after 1-2 doses he didn't feel well therefore stopped it 7 doesn't want to try other Rx...    Moodiness> this was mentioned last OV so we tried Zoloft50 w/ rec to incr to 100mg /d if needed; he says this does help but he only needs it prn & not regularly... We reviewed prob list, meds, xrays and labs> see below for updates >>  LABS 7/14:  FLP- improved on simva20+LopidBid;  Chems- wnl...  07/01/2013 Acute OV  Complains of increased pressure, difficulty urinating, increased frequency, urine is lighter in color x5-7days -Has been going on for a while but worse over last week.  denies odor, burning, difficulty emptying bladder, back pain, f/c/s. Last PSA 1.21 12/2012 . No hematuria, discharge, weight loss . Feels stream is weaker. Denies dribbling.         Problems List:    ALLERGY (ICD-995.3) - uses OTC allergy meds as needed + "netti pot"... he had pos testing by [facility] in 1999 w/ sens to pollen, trees, weeds (prev on immunotherapy & he felt that he was improved while on the shots, now symptoms slowly increasing off the shots)... he has received Depo shots for severe spring allergy  symptoms in the past.  Hx of PALPITATIONS (ICD-785.1) - on ASA 81mg /d... ~  cardiac eval by Delton See in 2004 was neg. ~  baseline EKG w/ NSR, WNL...  ~  NuclearStressTest 11/04 was neg for scar or ischemia, EF=56%... ~  4/12:  BP= 124/74 & he denies CP, palpit, dizzy, SOB, edema, etc... ~  5/13:  BP= 130/88 & he remains asymptomatic... ~  5/14:  BP= 122/82 & he denies CP, palpit, SOB/ DOE, edema...  HYPERCHOLESTEROLEMIA (ICD-272.0) - on ZOCOR 20mg  daily... TRICOR was added to his regimen in 2007 but he stopped this on his own...  ~  FLP 3/08 on Simva20/ Tricor showed TChol 152, TG 99 (down from 444), HDL 40, LDL 93... same. ~  FLP 4/09 on Simva20 showed TChol 184, TG 245, HDL 38, LDL 108... he didn't want to restart Fibrate. ~  FLP 3/10 on Simva20 showed TChol 145, TG 122, HDL 34, LDL 87... improved. ~  FLP 4/11 on Simva20 showed TChol 128, TG 299, HDL 34, LDL 54... needs better low fat diet. ~  FLP 4/12 on Simva20 QOD (he cut back on his own) showed TChol 182, TG 214, HDL 38, LDL 108... rec incr back to daily. ~  FLP 4/13 on Simva20 showed  TChol 151, TG 258, HDL 34, LDL 73 ~  FLP 4/14 on Simva20 showed TChol 198, TG 647, HDL 30, LDL 106... We discussed adding FENOFIBRATE160mg /d=> later ch to LOPID600Bid... ~  FLP 7/14 on Simva20+Lopid600Bid showed TChol 141, TG 163, HDL 34, LDL 74;  ABDOMINAL PAIN, LEFT LOWER QUADRANT (ICD-789.04) - see eval 3/11  Hx ELEVATED PROSTATE SPECIFIC ANTIGEN (ICD-790.93) ~  labs 3/10 showed PSA= 0.71 ~  labs 4/11 showed PSA= 4.96 & DRE is benign- normal feeling gland... he opted for trial antibiotic Rx & repeat lab ==> 1.42 ~  Labs 4/12 showed PSA= 1.04 ~  Labs 4/13 showed PSA= 0.87 ~  Labs 4/14 showed PSA= 1.21  DEGENERATIVE JOINT DISEASE (ICD-715.90) - he is s/p arthroscopic surg both knees by DrWainer in ?2002- we don't have records... GOUT (ICD-274.9) - Uric acid level 4/10 = 8.5 & pt was offered Allopurinol Rx, but he preferred to control this on a low  purine diet... Hx of BACK PAIN, LUMBAR (ICD-724.2) - he is s/p L4-5 lumbar laminotomy & microdiscectomy 2003 by DrNudelman... DIFFUSE IDIOPATHIC SKELETAL HYPEROSTOSIS (DISH) >> notes on lat CXR 5/14, there appears to be marked change from 2010, 2012... ~  Prev on Advil/ Aleve OTC;  then treated w/ Colchicine per DrWainer;  now on MELOXICAM per Ortho... ~  1/14:  He saw DrWainer 1/14 w/ right knee pain & swelling- arthrocentesis & cortisone shot given- told no uric crystals & stopped Colchicine in favor of Meloxicam & improved; he denies back pain issues... ~  5/14:  CXR shows DISH & prominent changes from prev films 2010, 2012; he has min back symptoms- we will refer to rheum for their review...   HEALTH MAINTENANCE:   ~  GI = DrMedoff w/ flex sig 7/93 = neg; Colonoscopy 4/08 showing rare divertics, no polyps...  ~  GU = 4/11 OV w/ norm DRE & sudden rise in PSA to 4.96... trial Doxy & f/u PSA 32mo= 1.42... PSA remained wnl since then. ~  Immunizations:  ?last Tetanus shot;  he has not been inclined to get the seasonal flu vaccines;  He requested Rx for Shingles vax after wife had an outbreak.   Past Surgical History  Procedure Laterality Date  . Knee arthroscopy      right  . Laminectomy and microdiscectomy lumbar spine  2003    L4-5  . Rectal surgery  2003    Dr. Orson Slick    Outpatient Encounter Prescriptions as of 07/01/2013  Medication Sig Dispense Refill  . aspirin 81 MG tablet Take 81 mg by mouth daily.        . colchicine (COLCRYS) 0.6 MG tablet TAKE 1 TABLET (0.6 MG TOTAL) BY MOUTH 2 (TWO) TIMES DAILY AS NEEDED      . gemfibrozil (LOPID) 600 MG tablet Take 1 tablet (600 mg total) by mouth 2 (two) times daily before a meal.  180 tablet  3  . Multiple Vitamin (MULTIVITAMIN) tablet Take 1 tablet by mouth daily.        . sertraline (ZOLOFT) 50 MG tablet Take 50 mg by mouth daily.      . simvastatin (ZOCOR) 20 MG tablet Take 1 tablet (20 mg total) by mouth at bedtime.  90 tablet  3  .  zolpidem (AMBIEN) 10 MG tablet Take one tablet at bedtime as directed  30 tablet  5  . tamsulosin (FLOMAX) 0.4 MG CAPS capsule Take 1 capsule (0.4 mg total) by mouth daily after supper.  30 capsule  5  . [  DISCONTINUED] COLCRYS 0.6 MG tablet TAKE 1 TABLET (0.6 MG TOTAL) BY MOUTH 2 (TWO) TIMES DAILY.  30 tablet  5  . [DISCONTINUED] sertraline (ZOLOFT) 50 MG tablet Take 1 tablet daily x 3 weeks then increase to 2 tablets daily thereafter  60 tablet  5   No facility-administered encounter medications on file as of 07/01/2013.    Allergies  Allergen Reactions  . Penicillins     REACTION: as a child--unsure of reaction    Review of Systems Constitutional:   No  weight loss, night sweats,  Fevers, chills, fatigue, or  lassitude.  HEENT:   No headaches,  Difficulty swallowing,  Tooth/dental problems, or  Sore throat,                No sneezing, itching, ear ache, nasal congestion, post nasal drip,   CV:  No chest pain,  Orthopnea, PND, swelling in lower extremities, anasarca, dizziness, palpitations, syncope.   GI  No heartburn, indigestion, abdominal pain, nausea, vomiting, diarrhea, change in bowel habits, loss of appetite, bloody stools.   Resp: No shortness of breath with exertion or at rest.  No excess mucus, no productive cough,  No non-productive cough,  No coughing up of blood.  No change in color of mucus.  No wheezing.  No chest wall deformity  Skin: no rash or lesions.  GU: no dysuria,  + urgency,  frequency.  No flank pain, no hematuria   MS:  No joint pain or swelling.  No decreased range of motion.  No back pain.  Psych:  No change in mood or affect. No depression or anxiety.  No memory loss.                Objective:   Physical Exam     WD, WN, 57 y/o WM in NAD... GENERAL:  Alert & oriented; pleasant & cooperative... HEENT:  [place]/AT, EOM-wnl, , EACs-clear, TMs-wnl, NOSE-clear, THROAT-clear & wnl. NECK:  Supple w/ full ROM; no JVD; normal carotid impulses w/o  bruits; no thyromegaly or nodules palpated; no lymphadenopathy. CHEST:  Clear to P & A; without wheezes/ rales/ or rhonchi. HEART:  Regular Rhythm; without murmurs/ rubs/ or gallops. ABDOMEN:  Soft & nontender; normal bowel sounds; no organomegaly or masses detected; no hernia palpated. RECTAL:  Declined  EXT: without deformities or arthritic changes; no varicose veins/ venous insuffic/ or edema. NEURO: gait normal & balance OK. DERM:  No lesions noted; no rash etc...    Assessment & Plan:

## 2013-07-01 NOTE — Assessment & Plan Note (Signed)
Suspect BPH ,  Check PSA  UA neg   Plan  Begin Flomax 0.4mg  At bedtime.  I will call with lab results.  Follow up Dr. Kriste Basque  As planned and As needed   Please contact office for sooner follow up if symptoms do not improve or worsen or seek emergency care

## 2013-07-01 NOTE — Patient Instructions (Signed)
Begin Flomax 0.4mg  At bedtime.  I will call with lab results.  Follow up Dr. Kriste Basque  As planned and As needed   Please contact office for sooner follow up if symptoms do not improve or worsen or seek emergency care

## 2013-07-02 LAB — URINE CULTURE: Colony Count: NO GROWTH

## 2013-10-20 ENCOUNTER — Telehealth: Payer: Self-pay | Admitting: Pulmonary Disease

## 2013-10-20 MED ORDER — COLCHICINE 0.6 MG PO TABS: ORAL_TABLET | ORAL | Status: DC

## 2013-10-20 MED ORDER — PREDNISONE (PAK) 5 MG PO TABS: ORAL_TABLET | ORAL | Status: DC

## 2013-10-20 NOTE — Telephone Encounter (Signed)
Called and spoke with pt. He reports he is having gout flare in right ankle x this AM. He does not have any colcrys. Please advise SN thanks  Allergies  Allergen Reactions  . Penicillins     REACTION: as a child--unsure of reaction

## 2013-10-20 NOTE — Telephone Encounter (Signed)
Per SN---  Prednisone dosepak  #1  Take as directed Colchicine 0.6 mg  #60  1 po bid as directed

## 2013-10-20 NOTE — Telephone Encounter (Signed)
Called and spoke w/ pt, aware of recs. RX sent in. Nothing further needed

## 2013-11-20 ENCOUNTER — Other Ambulatory Visit: Payer: Self-pay | Admitting: Pulmonary Disease

## 2014-01-12 ENCOUNTER — Telehealth: Payer: Self-pay | Admitting: Pulmonary Disease

## 2014-01-12 DIAGNOSIS — Z0000 Encounter for general adult medical examination without abnormal findings: Secondary | ICD-10-CM

## 2014-01-12 NOTE — Telephone Encounter (Signed)
Called and spoke with pt and he is aware of SN no longer seeing primary care pts.  Pt stated that he would like to come in for an appt in may for CPX and he will discuss with SN who he should see.  Pt is aware that we are not sure how his insurance will cover this appt.  Pt wanted to come in to see SN either way.  appt scheduled and labs are in the computer for the pt.

## 2014-01-15 ENCOUNTER — Other Ambulatory Visit (INDEPENDENT_AMBULATORY_CARE_PROVIDER_SITE_OTHER): Payer: BC Managed Care – PPO

## 2014-01-15 DIAGNOSIS — Z0000 Encounter for general adult medical examination without abnormal findings: Secondary | ICD-10-CM

## 2014-01-15 LAB — CBC WITH DIFFERENTIAL/PLATELET
BASOS ABS: 0 10*3/uL (ref 0.0–0.1)
Basophils Relative: 0.2 % (ref 0.0–3.0)
EOS ABS: 0.3 10*3/uL (ref 0.0–0.7)
Eosinophils Relative: 4.2 % (ref 0.0–5.0)
HEMATOCRIT: 45.1 % (ref 39.0–52.0)
HEMOGLOBIN: 15.3 g/dL (ref 13.0–17.0)
LYMPHS ABS: 1.3 10*3/uL (ref 0.7–4.0)
Lymphocytes Relative: 17.9 % (ref 12.0–46.0)
MCHC: 34 g/dL (ref 30.0–36.0)
MCV: 84 fl (ref 78.0–100.0)
Monocytes Absolute: 0.6 10*3/uL (ref 0.1–1.0)
Monocytes Relative: 8.8 % (ref 3.0–12.0)
NEUTROS ABS: 4.9 10*3/uL (ref 1.4–7.7)
Neutrophils Relative %: 68.9 % (ref 43.0–77.0)
PLATELETS: 204 10*3/uL (ref 150.0–400.0)
RBC: 5.37 Mil/uL (ref 4.22–5.81)
RDW: 14 % (ref 11.5–14.6)
WBC: 7.2 10*3/uL (ref 4.5–10.5)

## 2014-01-15 LAB — HEPATIC FUNCTION PANEL
ALBUMIN: 4.2 g/dL (ref 3.5–5.2)
ALK PHOS: 89 U/L (ref 39–117)
ALT: 43 U/L (ref 0–53)
AST: 34 U/L (ref 0–37)
BILIRUBIN DIRECT: 0.1 mg/dL (ref 0.0–0.3)
TOTAL PROTEIN: 6.7 g/dL (ref 6.0–8.3)
Total Bilirubin: 0.7 mg/dL (ref 0.3–1.2)

## 2014-01-15 LAB — LIPID PANEL
CHOL/HDL RATIO: 5
Cholesterol: 186 mg/dL (ref 0–200)
HDL: 39.4 mg/dL (ref >39.00)
LDL Cholesterol: 108 mg/dL — ABNORMAL HIGH (ref 0–99)
Triglycerides: 195 mg/dL — ABNORMAL HIGH (ref 0.0–149.0)
VLDL: 39 mg/dL (ref 0.0–40.0)

## 2014-01-15 LAB — BASIC METABOLIC PANEL
BUN: 17 mg/dL (ref 6–23)
CALCIUM: 9.3 mg/dL (ref 8.4–10.5)
CO2: 30 meq/L (ref 19–32)
Chloride: 103 mEq/L (ref 96–112)
Creatinine, Ser: 1 mg/dL (ref 0.4–1.5)
GFR: 77.9 mL/min (ref >60.00)
GLUCOSE: 86 mg/dL (ref 70–99)
POTASSIUM: 4.8 meq/L (ref 3.5–5.1)
SODIUM: 139 meq/L (ref 135–145)

## 2014-01-15 LAB — URINALYSIS
BILIRUBIN URINE: NEGATIVE
HGB URINE DIPSTICK: NEGATIVE
KETONES UR: NEGATIVE
Leukocytes, UA: NEGATIVE
Nitrite: NEGATIVE
Specific Gravity, Urine: 1.025 (ref 1.000–1.030)
TOTAL PROTEIN, URINE-UPE24: NEGATIVE
URINE GLUCOSE: NEGATIVE
UROBILINOGEN UA: 0.2 (ref 0.0–1.0)
pH: 6 (ref 5.0–8.0)

## 2014-01-15 LAB — TSH: TSH: 3.08 u[IU]/mL (ref 0.35–5.50)

## 2014-01-15 LAB — PSA: PSA: 1.25 ng/mL (ref 0.10–4.00)

## 2014-01-19 ENCOUNTER — Encounter: Payer: Self-pay | Admitting: Pulmonary Disease

## 2014-01-19 ENCOUNTER — Encounter (INDEPENDENT_AMBULATORY_CARE_PROVIDER_SITE_OTHER): Payer: Self-pay

## 2014-01-19 ENCOUNTER — Ambulatory Visit (INDEPENDENT_AMBULATORY_CARE_PROVIDER_SITE_OTHER): Payer: BC Managed Care – PPO | Admitting: Pulmonary Disease

## 2014-01-19 VITALS — BP 118/78 | HR 66 | Temp 97.9°F | Ht 68.0 in | Wt 197.4 lb

## 2014-01-19 DIAGNOSIS — T7840XA Allergy, unspecified, initial encounter: Secondary | ICD-10-CM

## 2014-01-19 DIAGNOSIS — M109 Gout, unspecified: Secondary | ICD-10-CM

## 2014-01-19 DIAGNOSIS — E785 Hyperlipidemia, unspecified: Secondary | ICD-10-CM

## 2014-01-19 DIAGNOSIS — M4810 Ankylosing hyperostosis [Forestier], site unspecified: Secondary | ICD-10-CM

## 2014-01-19 DIAGNOSIS — Z0000 Encounter for general adult medical examination without abnormal findings: Secondary | ICD-10-CM

## 2014-01-19 DIAGNOSIS — M1990 Unspecified osteoarthritis, unspecified site: Secondary | ICD-10-CM

## 2014-01-19 MED ORDER — CLOTRIMAZOLE-BETAMETHASONE 1-0.05 % EX CREA: TOPICAL_CREAM | CUTANEOUS | Status: DC

## 2014-01-19 NOTE — Progress Notes (Signed)
Subjective:    Patient ID: Dwayne Larsen, male    DOB: 1956/02/08, 58 y.o.   MRN: [id number]  HPI 58 y/o WM here for a follow up visit & CPX...  He is followed for general medical purposes with:  AR;  Hx palpit;  Hyperchol;  Hx elev PSA- ret to norm w/ antibiotic Rx;  DJD/ Gout;  Hx LBP...  ~  Jan 17, 2012:  Yearly ROV & CPX> Dwayne Larsen has had a good yr overall; no new complaints or concerns; his wife had Shingles & he wants Rx for the vaccine- written;  We reviewed his medical problems, medications, xrays & labs> see below>>  EKG 5/13 showed NSR, rate60, WNL, NAD...  LABS 4/13:  FLP- ok x TG=258 HDL=34;  Chems- wnl;  CBC- wnl;  TSH=2.17;  PSA=0.87  ~  Jan 16, 2013:  Yearly ROV & CPX> Dwayne Larsen has several concerns at this time>>    HxAR> prev pos allergy testing years ago; uses OTC antihist & netti pot as needed; this spring has been mild, doing satis w/o sneezing, congestion, etc...    LIPIDS> on Simva20; Labs were done prior to this OV & he is shocked by his FLP: TChol 198, TG 647, HDL 30, LDL 106; he notes that weight is stable at 197# (BMI=30) & states he's been eating better than any time in the past; he was prev on Fenofibrate in ~2007 but he stopped on his own & since then TGs have been 122-299; we discussed Lipoprotein Lipase enz & metabolism of the long chain fatty acids, the role of low fat diet & exercise, & the need for FENOFIBRATE 119m/d added to his Simva20; f/u labs w/ ROV 361mo.    DJD, GOUT, HxLBP, DISH> He saw DrWainer 1/14 w/ right knee pain & swelling; known hx DJD (prev arthroscopy ?2002 for meniscus tear) & Gout; he had arthrocentesis & cortisone shot- was told no uric crystals & stopped Colchicine in favor of Meloxicam & improved; he denies back pain issues at present (s/p surg 2003); routine CXR 5/14 shows DISH w/ progressive changes compared to 2010, 2012 => we will refer to Rheum for their review...    ?RLS, leg movement> new symptom- He describes some type of ?RLS but more during  the day, has to move or shake his leg like a nervous habit, occurs less at night & he/ wife don't notice it in bed, doesn't effect his sleep; I mentioned that this symptom may also improve w/ the Sertraline Rx (see below) but we wrote for REQUIP 0.47m57mo try prn...     Unusual symptoms> He notes unusual feeling of moodiness, short tempered, out of sorts- x 79mo59moo know ppt event; no concerns at home or work, new grandchild now 47mo 33mo& doing fine ([place] occas episodes of SOB "like I can't get a deep breath" & occas episodes of globus sensation; we reviewed the poss origin of these symptoms in a chemical imbalance & chest wall/ throat muscle spasms & offered med rx vs counseling- he prefers trial of the former first & we decided on SERTRALINE 50=>100mg/1me will call in 1 mo to go over his symptoms & response to med)...     We reviewed prob list, meds, xrays and labs> see below for updates >>   EKG done 5/13 and showed NSR, rate60, WNL, NAD...  CXR 5/14 showed normal heart size, clear lungs, degen changes in Tspine w/ diffuse idiopathic skeletal hyperostosis (DISH)  LABS 4/14:  FLP- not at goal w/ TG=647 7 we added Feno160 to his Simva20;  Chems- wnl;  CBC- wnl;  TSH=2.55;  PSA=1.21;  UA- clear...  ~  April 21, 2013:  259moROV & there were mult issues last visit all addressed as below>>    LIPIDS> on Simva20 & Chol is ok but last visit TG=647 & we started Feno160 but too $$ so switched to Lopid600Bid; tol well and f/u FLP now showed TChol 141, TG 163, HDL 34, LDL 74; much improved- continue same...    DISH> his CXR showed DISH & he was referred to rheum for their opinion; he tells me that that appt didn't go so well, DrDeveshwar didn't have our data & her only recommendations were for PT, yoga, etc...    RLS> we gave him a trial of Requip 0.552md but he says that even after 1-2 doses he didn't feel well therefore stopped it 7 doesn't want to try other Rx...    Moodiness> this was  mentioned last OV so we tried Zoloft50 w/ rec to incr to 10058m if needed; he says this does help but he only needs it prn & not regularly... We reviewed prob list, meds, xrays and labs> see below for updates >>   LABS 7/14:  FLP- improved on simva20+LopidBid;  Chems- wnl...   ~  Jan 19, 2014:  59mo36mo & CPX> Dwayne Larsen TP 10/14 w/ urinary symptoms & was started on Flomax w/ improvement in his flow, now back to normal; then he noted incr gas & bloating that he blamed on the Tamsulosin & he stopped it several weeks ago & the GI symptoms resolved; on exam his prostate is not enlarged & we decided to leave off the Flomax for now & observe for any recurrent GU symptoms... We reviewed the following medical problems during today's office visit >>     AR> he knows to use OTC antihist prn; he occas needs Pred dosepak if symptoms severe...    LIPIDS> on Simva20 & Lopid600; tol well and f/u FLP 4/15 showed TChol 186, TG 195, HDL 39, LDL 108...    DISH> his CXR showed DISH & he was referred to rheum for their opinion & rec to do PT & exercise; he continues to exercise regularly, sports, etc...    RLS> we gave him a trial of Requip 0.5mg/52mut he says that even after 1-2 doses he didn't feel well therefore stopped it & doesn't want to try other Rx...    Moodiness> this was mentioned last OV so we tried Zoloft50 w/ rec to incr to 100mg/69m needed; he says this does help but he only needs it prn & not regularly...    Seb Dermatitis rash> discussed w/ pt- Rec to use Head&Shoulders vs SelsunBlue vs T-Gel and Rx for Lotrisone cream... We reviewed prob list, meds, xrays and labs> see below for updates >>   LABS 4/15:  FLP- improved on Simva20+Lopid600;  Chems- wnl;  CBC- wnl;  TSH=3.08;  PSA=1.25...          Problems List:    ALLERGY (ICD-995.3) - uses OTC allergy meds as needed + "netti pot"... he had pos testing by [facility] in 1999 w/ sens to pollen, trees, weeds (prev on immunotherapy & he felt that he was improved  while on the shots, now symptoms slowly increasing off the shots)... he has received Depo shots for severe spring allergy symptoms in the past.  Hx of PALPITATIONS (ICD-785.1) -  on ASA 64m/d... ~  cardiac eval by DBenay Spicein 2004 was neg. ~  baseline EKG w/ NSR, WNL...  ~  NuclearStressTest 11/04 was neg for scar or ischemia, EF=56%... ~  4/12:  BP= 124/74 & he denies CP, palpit, dizzy, SOB, edema, etc... ~  5/13:  BP= 130/88 & he remains asymptomatic... ~  5/14:  BP= 122/82 & he denies CP, palpit, SOB/ DOE, edema... ~  5/15:  BP= 118/74 & he remains symptomatic...  HYPERCHOLESTEROLEMIA (ICD-272.0) - on ZOCOR 29mdaily... TRICOR was added to his regimen in 2007 but he stopped this on his own...  ~  [place] Simva[place]/ Tricor showed TChol 152, TG 99 (down from 444), HDL 40, LDL 93... same. ~  [place] Simva20 showed TChol 184, TG 245, HDL 38, LDL 108... he didn't want to restart Fibrate. ~  FLP 3/10 on Simva20 showed TChol 145, TG 122, HDL 34, LDL 87... improved. ~  [place] Simva20 showed TChol 128, TG 299, HDL 34, LDL 54... needs better low fat diet. ~  FLP 4/12 on Simva20 QOD (he cut back on his own) showed TChol 182, TG 214, HDL 38, LDL 108... rec incr back to daily. ~  [place]13 on Simva20 showed TChol 151, TG 258, HDL 34, LDL 73 ~  FLP 4/14 on Simva20 showed TChol 198, TG 647, HDL 30, LDL 106... We discussed adding FENOFIBRATE16077m=> later ch to LOPID600Bid... ~  FLP 7/14 on Simva20+Lopid600Bid showed TChol 141, TG 163, HDL 34, LDL 74  ~  FLP 4/15 on Simva20+Lopid600 showed  TChol 186, TG 195, HDL 39, LDL 108   ABDOMINAL PAIN, LEFT LOWER QUADRANT (ICD-789.04) - see eval 3/11  Hx ELEVATED PROSTATE SPECIFIC ANTIGEN (ICD-790.93) ~  labs 3/10 showed PSA= 0.71 ~  labs 4/11 showed PSA= 4.96 & DRE is benign- normal feeling gland... he opted for trial antibiotic Rx & repeat lab ==> 1.42 ~  Labs 4/12 showed PSA= 1.04 ~  Labs 4/13 showed PSA= 0.87 ~  Labs 4/14 showed PSA= 1.21 ~   Labs 4/15 showed PSA= 1.25  DEGENERATIVE JOINT DISEASE (ICD-715.90) - he is s/p arthroscopic surg both knees by DrWainer in ?2002- we don't have records... GOUT (ICD-274.9) - Uric acid level 4/10 = 8.5 & pt was offered Allopurinol Rx, but he preferred to control this on a low purine diet... Hx of BACK PAIN, LUMBAR (ICD-724.2) - he is s/p L4-5 lumbar laminotomy & microdiscectomy 2003 by DrNudelman... DIFFUSE IDIOPATHIC SKELETAL HYPEROSTOSIS (DISH) >> notes on lat CXR 5/14, there appears to be marked change from 2010, 2012... ~  Prev on Advil/ Aleve OTC;  then treated w/ Colchicine per DrWainer;  now on MELOXICAM per Ortho... ~  1/14:  He saw DrWainer 1/14 w/ right knee pain & swelling- arthrocentesis & cortisone shot given- told no uric crystals & stopped Colchicine in favor of Meloxicam & improved; he denies back pain issues... ~  5/14:  CXR shows DISH & prominent changes from prev films 2010, 2012; he has min back symptoms- we will refer to rheum for their review...   HEALTH MAINTENANCE:   ~  GI = DrMedoff w/ flex sig 7/93 = neg; Colonoscopy 4/08 showing rare divertics, no polyps...  ~  GU = 4/11 OV w/ norm DRE & sudden rise in PSA to 4.96... trial Doxy & f/u PSA 11mo56mo42... PSA remained wnl since then. ~  Immunizations:  ?last Tetanus shot;  he has not been inclined to get the seasonal flu  vaccines;  He requested Rx for Shingles vax after wife had an outbreak.   Past Surgical History  Procedure Laterality Date  . Knee arthroscopy      right  . Laminectomy and microdiscectomy lumbar spine  2003    L4-5  . Rectal surgery  2003    Dr. Deon Pilling    Outpatient Encounter Prescriptions as of 01/19/2014  Medication Sig  . aspirin 81 MG tablet Take 81 mg by mouth daily.    . colchicine (COLCRYS) 0.6 MG tablet TAKE 1 TABLET (0.6 MG TOTAL) BY MOUTH 2 (TWO) TIMES DAILY AS NEEDED  . gemfibrozil (LOPID) 600 MG tablet Take 1 tablet (600 mg total) by mouth 2 (two) times daily before a meal.  . Multiple  Vitamin (MULTIVITAMIN) tablet Take 1 tablet by mouth daily.    . predniSONE (STERAPRED UNI-PAK) 5 MG TABS tablet Take as directed  . sertraline (ZOLOFT) 50 MG tablet Take 50 mg by mouth daily.  . simvastatin (ZOCOR) 20 MG tablet Take 1 tablet (20 mg total) by mouth at bedtime.  . simvastatin (ZOCOR) 20 MG tablet TAKE 1 TABLET EVERY DAY  . tamsulosin (FLOMAX) 0.4 MG CAPS capsule Take 1 capsule (0.4 mg total) by mouth daily after supper.  . zolpidem (AMBIEN) 10 MG tablet Take one tablet at bedtime as directed    Allergies  Allergen Reactions  . Penicillins     REACTION: as a child--unsure of reaction    Review of Systems       The patient denies fever, chills, sweats, anorexia, fatigue, weakness, malaise, weight loss, sleep disorder, blurring, diplopia, eye irritation, eye discharge, vision loss, eye pain, photophobia, earache, ear discharge, tinnitus, decreased hearing, nasal congestion, nosebleeds, sore throat, hoarseness, chest pain, palpitations, syncope, dyspnea on exertion, orthopnea, PND, peripheral edema, cough, dyspnea at rest, excessive sputum, hemoptysis, wheezing, pleurisy, nausea, vomiting, diarrhea, constipation, change in bowel habits, abdominal pain, melena, hematochezia, jaundice, gas/bloating, indigestion/heartburn, dysphagia, odynophagia, dysuria, hematuria, urinary frequency, urinary hesitancy, nocturia, incontinence, back pain, joint pain, joint swelling, muscle cramps, muscle weakness, stiffness, arthritis, sciatica, restless legs, leg pain at night, leg pain with exertion, rash, itching, dryness, suspicious lesions, paralysis, paresthesias, seizures, tremors, vertigo, transient blindness, frequent falls, frequent headaches, difficulty walking, depression, anxiety, memory loss, confusion, cold intolerance, heat intolerance, polydipsia, polyphagia, polyuria, unusual weight change, abnormal bruising, bleeding, enlarged lymph nodes, urticaria, allergic rash, hay fever, and recurrent  infections.     Objective:   Physical Exam     WD, WN, 58 y/o WM in NAD... GENERAL:  Alert & oriented; pleasant & cooperative... HEENT:  [place]/AT, EOM-wnl, PERRLA, Fundi-benign, EACs-clear, TMs-wnl, NOSE-clear, THROAT-clear & wnl. NECK:  Supple w/ full ROM; no JVD; normal carotid impulses w/o bruits; no thyromegaly or nodules palpated; no lymphadenopathy. CHEST:  Clear to P & A; without wheezes/ rales/ or rhonchi. HEART:  Regular Rhythm; without murmurs/ rubs/ or gallops. ABDOMEN:  Soft & nontender; normal bowel sounds; no organomegaly or masses detected; no hernia palpated. RECTAL:  decr sphincter tone (prev surg), prostate feels normal= 2+size, no nodules; stool heme neg... EXT: without deformities or arthritic changes; no varicose veins/ venous insuffic/ or edema. NEURO:  CN's intact; motor testing normal; sensory testing normal; gait normal & balance OK. DERM:  No lesions noted; no rash etc...  RADIOLOGY DATA:  Reviewed in the EPIC EMR & discussed w/ the patient...  LABORATORY DATA:  Reviewed in the EPIC EMR & discussed w/ the patient...   Assessment & Plan:    CPX>  Good  general heath> but TG is still sl elev on Lopid600/d; discussed diet, exercise, wt reduction...  AR>  Stable w/ OTC meds as needed...  HYPERLIPID>  TGs was up to 647 & we decided to add LOPID600Bid due to cost but he cut to just one/d 7 wants to leave it there...  DJD/ GOUT>  Acute arthritic episode 1/14 w/ arthrocentesis & no Uric crystals seen; improved w/ shot & Meloxicam...  HxLBP w/ LLam/ DISH>  s/p L4-5 lumbar laminotomy & microdiscectomy 2003 by Pricilla Holm; CXR 5/14 shows DISH & he saw DrDeveshwar for her opinion=> rec for PT, Yoga etc...  ?RLS>  New symptom 4/14 but atypical; trial Requip 0.50m but he didn't like how it made him feel therefore stopped by pt...  ?Mood Disorder>  New symptom 4/14 & we discussed chemical imbalance, rec trial SERTRALINE 50 which apparently helped but only needed prn he  says...  Other medical issues as noted...   Patient's Medications  New Prescriptions   CLOTRIMAZOLE-BETAMETHASONE (LOTRISONE) CREAM    Apply to area as directed  Previous Medications   ASPIRIN 81 MG TABLET    Take 81 mg by mouth daily.     COLCHICINE (COLCRYS) 0.6 MG TABLET    TAKE 1 TABLET (0.6 MG TOTAL) BY MOUTH 2 (TWO) TIMES DAILY AS NEEDED   GEMFIBROZIL (LOPID) 600 MG TABLET    Take 600 mg by mouth daily.   MULTIPLE VITAMIN (MULTIVITAMIN) TABLET    Take 1 tablet by mouth daily.     PREDNISONE (STERAPRED UNI-PAK) 5 MG TABS TABLET    Take as directed   SIMVASTATIN (ZOCOR) 20 MG TABLET    Take 1 tablet (20 mg total) by mouth at bedtime.   TAMSULOSIN (FLOMAX) 0.4 MG CAPS CAPSULE    Take 1 capsule (0.4 mg total) by mouth daily after supper.   ZOLPIDEM (AMBIEN) 10 MG TABLET    Take one tablet at bedtime as directed  Modified Medications   No medications on file  Discontinued Medications   GEMFIBROZIL (LOPID) 600 MG TABLET    Take 1 tablet (600 mg total) by mouth 2 (two) times daily before a meal.   SERTRALINE (ZOLOFT) 50 MG TABLET    Take 50 mg by mouth daily.   SIMVASTATIN (ZOCOR) 20 MG TABLET    TAKE 1 TABLET EVERY DAY

## 2014-01-19 NOTE — Patient Instructions (Signed)
Today we updated your med list in our EPIC system...    Continue your current medications the same...  For the Seborrheic dermatitis>>    Try to alternate Head&Shoulders w/ SelsueBlue shampoo...    Apply the Lotrisone cream to the skin rash up to twice daily as needed...  Call for any questions or if we can be of service in any way.Marland KitchenMarland Kitchen

## 2014-03-07 ENCOUNTER — Encounter (HOSPITAL_COMMUNITY): Payer: Self-pay | Admitting: Emergency Medicine

## 2014-03-07 ENCOUNTER — Emergency Department (HOSPITAL_COMMUNITY): Payer: BC Managed Care – PPO

## 2014-03-07 ENCOUNTER — Ambulatory Visit (HOSPITAL_COMMUNITY): Admit: 2014-03-07 | Payer: Self-pay | Admitting: Cardiovascular Disease

## 2014-03-07 ENCOUNTER — Inpatient Hospital Stay (HOSPITAL_COMMUNITY)
Admission: EM | Admit: 2014-03-07 | Discharge: 2014-03-09 | DRG: 247 | Disposition: A | Discharge status: Home / Self Care | Payer: BC Managed Care – PPO | Attending: Cardiovascular Disease | Admitting: Cardiovascular Disease

## 2014-03-07 ENCOUNTER — Other Ambulatory Visit: Payer: Self-pay

## 2014-03-07 ENCOUNTER — Encounter (HOSPITAL_COMMUNITY)
Admission: EM | Disposition: A | Discharge status: Home / Self Care | Payer: BC Managed Care – PPO | Source: Home / Self Care | Attending: Cardiovascular Disease

## 2014-03-07 DIAGNOSIS — I2510 Atherosclerotic heart disease of native coronary artery without angina pectoris: Secondary | ICD-10-CM | POA: Diagnosis present

## 2014-03-07 DIAGNOSIS — M1990 Unspecified osteoarthritis, unspecified site: Secondary | ICD-10-CM | POA: Diagnosis present

## 2014-03-07 DIAGNOSIS — Z7982 Long term (current) use of aspirin: Secondary | ICD-10-CM

## 2014-03-07 DIAGNOSIS — I4729 Other ventricular tachycardia: Secondary | ICD-10-CM | POA: Diagnosis not present

## 2014-03-07 DIAGNOSIS — M109 Gout, unspecified: Secondary | ICD-10-CM | POA: Diagnosis present

## 2014-03-07 DIAGNOSIS — Z8249 Family history of ischemic heart disease and other diseases of the circulatory system: Secondary | ICD-10-CM

## 2014-03-07 DIAGNOSIS — I472 Ventricular tachycardia, unspecified: Secondary | ICD-10-CM | POA: Diagnosis not present

## 2014-03-07 DIAGNOSIS — I2589 Other forms of chronic ischemic heart disease: Secondary | ICD-10-CM

## 2014-03-07 DIAGNOSIS — I213 ST elevation (STEMI) myocardial infarction of unspecified site: Secondary | ICD-10-CM

## 2014-03-07 DIAGNOSIS — E785 Hyperlipidemia, unspecified: Secondary | ICD-10-CM | POA: Diagnosis present

## 2014-03-07 DIAGNOSIS — Z955 Presence of coronary angioplasty implant and graft: Secondary | ICD-10-CM

## 2014-03-07 DIAGNOSIS — Z87891 Personal history of nicotine dependence: Secondary | ICD-10-CM

## 2014-03-07 DIAGNOSIS — I369 Nonrheumatic tricuspid valve disorder, unspecified: Secondary | ICD-10-CM

## 2014-03-07 DIAGNOSIS — Z981 Arthrodesis status: Secondary | ICD-10-CM

## 2014-03-07 DIAGNOSIS — IMO0002 Reserved for concepts with insufficient information to code with codable children: Secondary | ICD-10-CM

## 2014-03-07 DIAGNOSIS — I2119 ST elevation (STEMI) myocardial infarction involving other coronary artery of inferior wall: Secondary | ICD-10-CM

## 2014-03-07 DIAGNOSIS — E7800 Pure hypercholesterolemia, unspecified: Secondary | ICD-10-CM | POA: Diagnosis present

## 2014-03-07 DIAGNOSIS — I447 Left bundle-branch block, unspecified: Secondary | ICD-10-CM | POA: Diagnosis present

## 2014-03-07 HISTORY — PX: LEFT HEART CATHETERIZATION WITH CORONARY ANGIOGRAM: SHX5451

## 2014-03-07 HISTORY — PX: PERCUTANEOUS CORONARY STENT INTERVENTION (PCI-S): SHX5485

## 2014-03-07 HISTORY — DX: Atherosclerotic heart disease of native coronary artery without angina pectoris: I25.10

## 2014-03-07 LAB — COMPREHENSIVE METABOLIC PANEL
ALT: 44 U/L (ref 0–53)
AST: 131 U/L — ABNORMAL HIGH (ref 0–37)
Albumin: 4.4 g/dL (ref 3.5–5.2)
Alkaline Phosphatase: 107 U/L (ref 39–117)
BUN: 16 mg/dL (ref 6–23)
CALCIUM: 10.2 mg/dL (ref 8.4–10.5)
CO2: 26 meq/L (ref 19–32)
Chloride: 98 mEq/L (ref 96–112)
Creatinine, Ser: 1.13 mg/dL (ref 0.50–1.35)
GFR calc Af Amer: 81 mL/min — ABNORMAL LOW (ref >90)
GFR, EST NON AFRICAN AMERICAN: 70 mL/min — AB (ref >90)
GLUCOSE: 179 mg/dL — AB (ref 70–99)
Potassium: 4.6 mEq/L (ref 3.7–5.3)
Sodium: 140 mEq/L (ref 137–147)
TOTAL PROTEIN: 7.7 g/dL (ref 6.0–8.3)
Total Bilirubin: 1 mg/dL (ref 0.3–1.2)

## 2014-03-07 LAB — I-STAT TROPONIN, ED: Troponin i, poc: 5.42 ng/mL (ref 0.00–0.08)

## 2014-03-07 LAB — CBC
HEMATOCRIT: 46.5 % (ref 39.0–52.0)
Hemoglobin: 16.2 g/dL (ref 13.0–17.0)
MCH: 28.7 pg (ref 26.0–34.0)
MCHC: 34.8 g/dL (ref 30.0–36.0)
MCV: 82.4 fL (ref 78.0–100.0)
Platelets: 208 10*3/uL (ref 150–400)
RBC: 5.64 MIL/uL (ref 4.22–5.81)
RDW: 13.5 % (ref 11.5–15.5)
WBC: 11.9 10*3/uL — AB (ref 4.0–10.5)

## 2014-03-07 LAB — MRSA PCR SCREENING: MRSA by PCR: NEGATIVE

## 2014-03-07 LAB — TROPONIN I: Troponin I: 10.53 ng/mL (ref <0.30)

## 2014-03-07 SURGERY — LEFT HEART CATHETERIZATION WITH CORONARY ANGIOGRAM
Anesthesia: LOCAL

## 2014-03-07 MED ORDER — ASPIRIN 81 MG PO CHEW
81.0000 mg | CHEWABLE_TABLET | Freq: Every day | ORAL | Status: DC
  Administered 2014-03-07 – 2014-03-09 (×3): 81 mg via ORAL
  Filled 2014-03-07 (×3): qty 1

## 2014-03-07 MED ORDER — METOPROLOL TARTRATE 12.5 MG HALF TABLET
12.5000 mg | ORAL_TABLET | Freq: Two times a day (BID) | ORAL | Status: DC
  Administered 2014-03-07 – 2014-03-09 (×4): 12.5 mg via ORAL
  Filled 2014-03-07 (×5): qty 1

## 2014-03-07 MED ORDER — TICAGRELOR 90 MG PO TABS
ORAL_TABLET | ORAL | Status: AC
  Administered 2014-03-08: 90 mg via ORAL
  Filled 2014-03-07: qty 1

## 2014-03-07 MED ORDER — VERAPAMIL HCL 2.5 MG/ML IV SOLN
INTRAVENOUS | Status: AC
  Filled 2014-03-07: qty 2

## 2014-03-07 MED ORDER — BIVALIRUDIN 250 MG IV SOLR
INTRAVENOUS | Status: AC
  Filled 2014-03-07: qty 250

## 2014-03-07 MED ORDER — MIDAZOLAM HCL 2 MG/2ML IJ SOLN
INTRAMUSCULAR | Status: AC
Start: 2014-03-07 — End: 2014-03-07
  Filled 2014-03-07: qty 2

## 2014-03-07 MED ORDER — HEPARIN BOLUS VIA INFUSION
4000.0000 [IU] | Freq: Once | INTRAVENOUS | Status: AC
Start: 2014-03-07 — End: 2014-03-07
  Administered 2014-03-07: 4000 [IU] via INTRAVENOUS
  Filled 2014-03-07: qty 4000

## 2014-03-07 MED ORDER — METOPROLOL TARTRATE 1 MG/ML IV SOLN
INTRAVENOUS | Status: AC
  Filled 2014-03-07: qty 5

## 2014-03-07 MED ORDER — LIDOCAINE HCL (PF) 1 % IJ SOLN
INTRAMUSCULAR | Status: AC
  Filled 2014-03-07: qty 30

## 2014-03-07 MED ORDER — HEPARIN SODIUM (PORCINE) 5000 UNIT/ML IJ SOLN
60.0000 [IU]/kg | Freq: Once | INTRAMUSCULAR | Status: DC
Start: 2014-03-07 — End: 2014-03-07

## 2014-03-07 MED ORDER — NITROGLYCERIN 0.2 MG/ML ON CALL CATH LAB
INTRAVENOUS | Status: AC
  Filled 2014-03-07: qty 1

## 2014-03-07 MED ORDER — ASPIRIN 81 MG PO CHEW
CHEWABLE_TABLET | ORAL | Status: AC
  Filled 2014-03-07: qty 4

## 2014-03-07 MED ORDER — TICAGRELOR 90 MG PO TABS
ORAL_TABLET | ORAL | Status: AC
  Filled 2014-03-07: qty 1

## 2014-03-07 MED ORDER — SODIUM CHLORIDE 0.9 % IV SOLN
INTRAVENOUS | Status: AC
  Administered 2014-03-07: 09:00:00 via INTRAVENOUS

## 2014-03-07 MED ORDER — MORPHINE SULFATE 2 MG/ML IJ SOLN
INTRAMUSCULAR | Status: AC
  Filled 2014-03-07: qty 1

## 2014-03-07 MED ORDER — NITROGLYCERIN 0.4 MG SL SUBL
SUBLINGUAL_TABLET | SUBLINGUAL | Status: AC
  Filled 2014-03-07: qty 1

## 2014-03-07 MED ORDER — SODIUM CHLORIDE 0.9 % IV BOLUS (SEPSIS)
500.0000 mL | Freq: Once | INTRAVENOUS | Status: AC
  Administered 2014-03-07: 500 mL via INTRAVENOUS

## 2014-03-07 MED ORDER — MORPHINE SULFATE 2 MG/ML IJ SOLN
INTRAMUSCULAR | Status: AC
  Administered 2014-03-07: 2 mg via INTRAVENOUS
  Filled 2014-03-07: qty 1

## 2014-03-07 MED ORDER — HEPARIN (PORCINE) IN NACL 2-0.9 UNIT/ML-% IJ SOLN
INTRAMUSCULAR | Status: AC
  Filled 2014-03-07: qty 1000

## 2014-03-07 MED ORDER — TAMSULOSIN HCL 0.4 MG PO CAPS
0.4000 mg | ORAL_CAPSULE | Freq: Every day | ORAL | Status: DC
  Filled 2014-03-07 (×3): qty 1

## 2014-03-07 MED ORDER — MORPHINE SULFATE 2 MG/ML IJ SOLN
2.0000 mg | Freq: Once | INTRAMUSCULAR | Status: AC
  Administered 2014-03-07 (×2): 2 mg via INTRAVENOUS

## 2014-03-07 MED ORDER — ONDANSETRON HCL 4 MG/2ML IJ SOLN
4.0000 mg | Freq: Once | INTRAMUSCULAR | Status: AC
  Administered 2014-03-07: 4 mg via INTRAVENOUS
  Filled 2014-03-07: qty 2

## 2014-03-07 MED ORDER — TICAGRELOR 90 MG PO TABS
90.0000 mg | ORAL_TABLET | Freq: Two times a day (BID) | ORAL | Status: DC
  Administered 2014-03-07 – 2014-03-09 (×4): 90 mg via ORAL
  Filled 2014-03-07 (×5): qty 1

## 2014-03-07 MED ORDER — ASPIRIN 81 MG PO CHEW
324.0000 mg | CHEWABLE_TABLET | Freq: Once | ORAL | Status: AC
  Administered 2014-03-07: 324 mg via ORAL
  Filled 2014-03-07: qty 4

## 2014-03-07 MED ORDER — ATORVASTATIN CALCIUM 80 MG PO TABS
80.0000 mg | ORAL_TABLET | Freq: Every day | ORAL | Status: DC
  Administered 2014-03-07 – 2014-03-08 (×2): 80 mg via ORAL
  Filled 2014-03-07 (×3): qty 1

## 2014-03-07 MED ORDER — HEPARIN (PORCINE) IN NACL 100-0.45 UNIT/ML-% IJ SOLN
900.0000 [IU]/h | INTRAMUSCULAR | Status: DC
  Administered 2014-03-07: 900 [IU]/h via INTRAVENOUS
  Filled 2014-03-07 (×2): qty 250

## 2014-03-07 MED ORDER — METOPROLOL TARTRATE 25 MG PO TABS
25.0000 mg | ORAL_TABLET | Freq: Two times a day (BID) | ORAL | Status: DC
  Administered 2014-03-07: 25 mg via ORAL
  Filled 2014-03-07: qty 1

## 2014-03-07 NOTE — H&P (Signed)
History and Physical  Patient ID: Dwayne Larsen MRN: [id number] DOB/AGE: [birth year] 58 y.o. Admit date: 03/07/2014  Primary Care Physician: Michele McalpineNADEL,SCOTT M, MD Primary Cardiologist : New   HPI: This is a pleasant 58 year old male with no previous cardiac history. He has known history of hyperlipidemia and gout. He stopped his hyperlipidemia medications in may. He presented with lower substernal chest discomfort. This started yesterday at 4 PM while he was playing golf. He initially thought it was related to a stomach problem and dehydration. He took extra fluids but continued to have symptoms. He went to sleep and woke up with continued discomfort and thus he came to the emergency room. EKG showed left bundle branch block with inferior ST elevation.  Review of systems complete and found to be negative unless listed above  Past Medical History  Diagnosis Date  . Allergy, unspecified not elsewhere classified   . Palpitations   . Hypercholesteremia   . Abdominal pain, left lower quadrant   . Elevated prostate specific antigen (PSA)   . DJD (degenerative joint disease)   . Gout   . Lumbar back pain     Family History  Problem Relation Age of Onset  . Coronary artery disease Father   . Hyperlipidemia Father   . Deep vein thrombosis Father     History   Social History  . Marital Status: Married    Spouse Name: N/A    Number of Children: N/A  . Years of Education: N/A   Occupational History  . Jewelry appraiser    Social History Main Topics  . Smoking status: Former Smoker -- 1.00 packs/day for 10 years    Types: Cigarettes    Quit date: 09/18/1980  . Smokeless tobacco: Never Used     Comment: 1/2-1 ppd, ages [age]  . Alcohol Use: Yes     Comment: social  . Drug Use: No  . Sexual Activity: Not on file   Other Topics Concern  . Not on file   Social History Narrative  . No narrative on file    Past Surgical History  Procedure Laterality Date  . Knee arthroscopy     right  . Laminectomy and microdiscectomy lumbar spine  2003    L4-5  . Rectal surgery  2003    Dr. Orson SlickBowman     Prescriptions prior to admission  Medication Sig Dispense Refill  . aspirin 81 MG tablet Take 81 mg by mouth daily.        . clotrimazole-betamethasone (LOTRISONE) cream Apply to area as directed  30 g  4  . colchicine (COLCRYS) 0.6 MG tablet TAKE 1 TABLET (0.6 MG TOTAL) BY MOUTH 2 (TWO) TIMES DAILY AS NEEDED  60 tablet  0  . gemfibrozil (LOPID) 600 MG tablet Take 600 mg by mouth daily.      . Multiple Vitamin (MULTIVITAMIN) tablet Take 1 tablet by mouth daily.        . predniSONE (STERAPRED UNI-PAK) 5 MG TABS tablet Take as directed  21 tablet  0  . simvastatin (ZOCOR) 20 MG tablet Take 1 tablet (20 mg total) by mouth at bedtime.  90 tablet  3  . tamsulosin (FLOMAX) 0.4 MG CAPS capsule Take 1 capsule (0.4 mg total) by mouth daily after supper.  30 capsule  5  . zolpidem (AMBIEN) 10 MG tablet Take one tablet at bedtime as directed  30 tablet  5    Physical Exam: Blood pressure 106/69, pulse 94, temperature 97.6 F (36.4  C), temperature source Oral, resp. rate 17, height 5' 8.11" (1.73 m), weight 89.5 kg (197 lb 5 oz), SpO2 99.00%.   Constitutional: He is oriented to person, place, and time. He appears well-developed and well-nourished. No distress.  HENT: No nasal discharge.  Head: Normocephalic and atraumatic.  Eyes: Pupils are equal and round.  No discharge. Neck: Normal range of motion. Neck supple. No JVD present. No thyromegaly present.  Cardiovascular: Normal rate, regular rhythm, normal heart sounds. Exam reveals no gallop and no friction rub. No murmur heard.  Pulmonary/Chest: Effort normal and breath sounds normal. No stridor. No respiratory distress. He has no wheezes. He has no rales. He exhibits no tenderness.  Abdominal: Soft. Bowel sounds are normal. He exhibits no distension. There is no tenderness. There is no rebound and no guarding.  Musculoskeletal: Normal  range of motion. He exhibits no edema and no tenderness.  Neurological: He is alert and oriented to person, place, and time. Coordination normal.  Skin: Skin is warm and dry. No rash noted. He is not diaphoretic. No erythema. No pallor.  Psychiatric: He has a normal mood and affect. His behavior is normal. Judgment and thought content normal.      Labs:   Lab Results  Component Value Date   WBC 11.9* 03/07/2014   HGB 16.2 03/07/2014   HCT 46.5 03/07/2014   MCV 82.4 03/07/2014   PLT 208 03/07/2014    Recent Labs Lab 03/07/14 0649  NA 140  K 4.6  CL 98  CO2 26  BUN 16  CREATININE 1.13  CALCIUM 10.2  PROT 7.7  BILITOT 1.0  ALKPHOS 107  ALT 44  AST 131*  GLUCOSE 179*   Lab Results  Component Value Date   TROPONINI 10.53* 03/07/2014    Lab Results  Component Value Date   CHOL 186 01/15/2014   CHOL 141 04/15/2013   CHOL 198 01/13/2013   Lab Results  Component Value Date   HDL 39.40 01/15/2014   HDL 24.82* 04/15/2013   HDL 29.50* 01/13/2013   Lab Results  Component Value Date   LDLCALC 108* 01/15/2014   LDLCALC 74 04/15/2013   LDLCALC 87 12/07/2008   Lab Results  Component Value Date   TRIG 195.0* 01/15/2014   TRIG 163.0* 04/15/2013   TRIG 647.0* 01/13/2013   Lab Results  Component Value Date   CHOLHDL 5 01/15/2014   CHOLHDL 4 04/15/2013   CHOLHDL 7 01/13/2013   Lab Results  Component Value Date   LDLDIRECT 105.7 01/13/2013   LDLDIRECT 73.0 01/10/2012   LDLDIRECT 108.3 12/26/2010      Radiology: No results found.  EKG: Left bundle branch block with inferior ST elevation.  ASSESSMENT AND PLAN:  1. Inferior ST elevation myocardial infarction: The patient underwent emergent cardiac catheterization which showed thrombotic occlusion of the distal right coronary artery. I performed successful angioplasty, aspiration thrombectomy and drug-eluting stent placement. He has residual moderate disease which would be treated medically. Continue dual antiplatelet therapy for at  least one year.  2. Idioventricular rhythm: This is typically a reperfusion rhythm and should resolve within the next 24 hours. I started small dose metoprolol. Monitor for ventricular tachycardia.  3. Ischemic cardiomyopathy: Ejection fraction was 40% likely due to late presentation. Small dose metoprolol was started. An ACE inhibitor has not been initiated yet due to low blood pressure.  4, hyperlipidemia: I started high dose atorvastatin. He did have previous elevation in triglyceride and might require additional Medications in the future.  Signed:  Lorine Bears MD, [facility] 03/07/2014, 10:57 AM

## 2014-03-07 NOTE — ED Notes (Signed)
Pt states he was playing golf yesterday and when he got in car he felt like he was going to pass out,  States he didn't sleep well last night epigastric pain,  Nausea and vomiting,  Diaphoretic,  Cold and pale.  Pt states it feels like someone punched him in his stomach right below sternum.

## 2014-03-07 NOTE — ED Provider Notes (Signed)
CSN: [id number]     Arrival date & time 03/07/14  0630 History   First MD Initiated Contact with Patient 03/07/14 0631     Chief Complaint  Patient presents with  . Chest Pain     (Consider location/radiation/quality/duration/timing/severity/associated sxs/prior Treatment) Patient is a 58 y.o. male presenting with chest pain.  Chest Pain Associated symptoms: abdominal pain, diaphoresis, nausea, palpitations and vomiting   Associated symptoms: no shortness of breath     JEBADIAH BOYDEN is a(n) 58 y.o. male who presents to the emergency department with chief complaint of epigastric discomfort. Patient states that yesterday he was playing golf and on the way home in his car he suddenly became diaphoretic, nauseous, felt as if he was going to pass out. Patient states he thought he might have been dehydrated. He went home, drank water to shower. He then developed epigastric abdominal comfort. He states he feels like "someone reached inside my stomach and grabbed it." he noticed palpitations which he has had in the past. He had associated nausea with continued intermittent diaphoresis throughout the night. The patient states he had one episode of self-induced vomiting due to severe nausea. When he awoke this morning after very little sleep he states that he continued having the epigastric discomfort and decided to come to the emergency department. His wife states that he was very cold and pale last night. He states that his father has a history of CAD with a CABG in his 52s. He has a hx of high cholesterol which is well controlled with zocor. He denies a hx or smoking, htn, dm. He exercises daily and has a very healthy diet. He has noticed some epigastric discomfort with exercise for the past month.  Past Medical History  Diagnosis Date  . Allergy, unspecified not elsewhere classified   . Palpitations   . Hypercholesteremia   . Abdominal pain, left lower quadrant   . Elevated prostate specific antigen  (PSA)   . DJD (degenerative joint disease)   . Gout   . Lumbar back pain    Past Surgical History  Procedure Laterality Date  . Knee arthroscopy      right  . Laminectomy and microdiscectomy lumbar spine  2003    L4-5  . Rectal surgery  2003    Dr. Orson Slick   Family History  Problem Relation Age of Onset  . Coronary artery disease Father   . Hyperlipidemia Father   . Deep vein thrombosis Father    History  Substance Use Topics  . Smoking status: Former Smoker -- 1.00 packs/day for 10 years    Types: Cigarettes    Quit date: 09/18/1980  . Smokeless tobacco: Never Used     Comment: 1/2-1 ppd, ages [age]  . Alcohol Use: Yes     Comment: social    Review of Systems  Constitutional: Positive for chills, diaphoresis, activity change and appetite change.  Respiratory: Positive for chest tightness. Negative for shortness of breath.   Cardiovascular: Positive for chest pain and palpitations.  Gastrointestinal: Positive for nausea, vomiting and abdominal pain.  Skin: Positive for color change.       Pale, diaphoretic  All other systems reviewed and are negative.   Ten systems reviewed and are negative for acute change, except as noted in the HPI.    Allergies  Penicillins  Home Medications   Prior to Admission medications   Medication Sig Start Date End Date Taking? Authorizing Provider  aspirin 81 MG tablet Take 81 mg  by mouth daily.      Historical Provider, MD  clotrimazole-betamethasone (LOTRISONE) cream Apply to area as directed 01/19/14   Michele McalpineScott M Nadel, MD  colchicine (COLCRYS) 0.6 MG tablet TAKE 1 TABLET (0.6 MG TOTAL) BY MOUTH 2 (TWO) TIMES DAILY AS NEEDED 10/20/13   Michele McalpineScott M Nadel, MD  gemfibrozil (LOPID) 600 MG tablet Take 600 mg by mouth daily.    Historical Provider, MD  Multiple Vitamin (MULTIVITAMIN) tablet Take 1 tablet by mouth daily.      Historical Provider, MD  predniSONE (STERAPRED UNI-PAK) 5 MG TABS tablet Take as directed 10/20/13   Michele McalpineScott M Nadel, MD   simvastatin (ZOCOR) 20 MG tablet Take 1 tablet (20 mg total) by mouth at bedtime. 01/17/12 07/01/14  Michele McalpineScott M Nadel, MD  tamsulosin (FLOMAX) 0.4 MG CAPS capsule Take 1 capsule (0.4 mg total) by mouth daily after supper. 07/01/13   Tammy S Parrett, NP  zolpidem (AMBIEN) 10 MG tablet Take one tablet at bedtime as directed 01/16/13   Michele McalpineScott M Nadel, MD   BP 132/80  Pulse 66  Temp(Src) 97.6 F (36.4 C) (Oral)  Resp 20  SpO2 100% Physical Exam  Nursing note and vitals reviewed. Constitutional: He appears well-developed and well-nourished. No distress.  Appears anxious  HENT:  Head: Normocephalic and atraumatic.  Eyes: Conjunctivae are normal. No scleral icterus.  Neck: Normal range of motion. Neck supple.  Cardiovascular: Normal rate and intact distal pulses.  Exam reveals gallop.   S3 gallop heard best over the L 3rd ICS  Pulmonary/Chest: Effort normal and breath sounds normal. No respiratory distress.  Abdominal: Soft. There is no tenderness.  Musculoskeletal: He exhibits no edema.  Neurological: He is alert.  Skin: Skin is warm and dry. He is not diaphoretic. There is pallor.  Psychiatric: His behavior is normal.       ED Course  Procedures (including critical care time) Labs Review Labs Reviewed  CBC - Abnormal; Notable for the following:    WBC 11.9 (*)    All other components within normal limits  I-STAT TROPOININ, ED - Abnormal; Notable for the following:    Troponin i, poc 5.42 (*)    All other components within normal limits  COMPREHENSIVE METABOLIC PANEL  TROPONIN I  APTT  PROTIME-INR  I-STAT TROPOININ, ED    Imaging Review No results found.   EKG Interpretation None      CRITICAL CARE Performed by: Arthor CaptainHarris, Abigail   Total critical care time: 30  Critical care time was exclusive of separately billable procedures and treating other patients.  Critical care was necessary to treat or prevent imminent or life-threatening deterioration.  Critical care was  time spent personally by me on the following activities: development of treatment plan with patient and/or surrogate as well as nursing, discussions with consultants, evaluation of patient's response to treatment, examination of patient, obtaining history from patient or surrogate, ordering and performing treatments and interventions, ordering and review of laboratory studies, ordering and review of radiographic studies, pulse oximetry and re-evaluation of patient's condition.   MDM   Final diagnoses:  ST elevation myocardial infarction (STEMI), unspecified artery    Patient with new LBBB, EKG changes concering for New Acute Inferior MI. He has received asa and heparin. Hold Nitro due to inferior MI concers. New S3 gallop present.  Patient seen in shared visit with Dr. Jodi MourningZavitz. He is accepted to the Cath Lab by Dr. Algie CofferKadakia.   e  Arthor CaptainAbigail Harris, PA-C 03/07/14 [phone]

## 2014-03-07 NOTE — Progress Notes (Signed)
Dr sent text re low b/p's and low Hr (SB 50) ,yet one run v-tach ( HR = 139 @ 1638pm) .  Numerous runs v-tach prior to am dose of metoprolol ( although , even then HR was often  40's to low 50's) . Pt asymptomatic . Ate half of" heart healthy" lunch . Ambulated in hall earlier w/cardiac rehab team ( Hr sustained in 80's during amb). Marland Kitchen

## 2014-03-07 NOTE — CV Procedure (Signed)
Cardiac Catheterization Procedure Note  Name: Dwayne Larsen MRN: [id number] DOB: [birth year]  Procedure: Left Heart Cath, Selective Coronary Angiography, LV angiography, PTCA, aspiration thrombectomy and stenting of the distal right coronary artery  Indication: Acute inferior ST elevation myocardial infarction  Medications:  Sedation:  1 mg IV Versed, 0 mcg IV Fentanyl  Contrast:  135 Omnipaque  Procedural Details: The right wrist was prepped, draped, and anesthetized with 1% lidocaine. Using the modified Seldinger technique, a 5 French Slender sheath was introduced into the right radial artery. 3 mg of verapamil was administered through the sheath, weight-based unfractionated heparin was administered intravenously. A Jackie catheter was used for selective coronary angiography. A pigtail catheter was used for left ventriculography. Catheter exchanges were performed over an exchange length guidewire. There were no immediate procedural complications.  Procedural Findings:  Hemodynamics: AO:  106/73   mmHg LV:  104/13    mmHg LVEDP: 20  mmHg  Coronary angiography: Coronary dominance: Right   Left Main:  The normal  Left Anterior Descending (LAD):  Normal in size with diffuse 20% disease in the proximal segment and minor irregularities elsewhere. He the vessel is overall moderately calcified.   1st diagonal (D1):  Normal in size with minor irregularities.  2nd diagonal (D2):  Small in size with minor irregularities.  3rd diagonal (D3):  Normal in size with minor irregularities.  Circumflex (LCx):  Normal in size and nondominant. It is 50% stenosis in the midsegment. The vessel is mildly calcified  1st obtuse marginal:  Normal in size with minor irregularities.  2nd obtuse marginal:  Normal in size with no significant disease.  3rd obtuse marginal:  Small in size with no significant disease.    Right Coronary Artery: Large in size and dominant. There is 50% stenosis in the  midsegment. The vessel is occluded distally before bifurcation with large thrombus.  Posterior descending artery: Normal in size with 60% stenosis in the proximal and midsegment.  Posterior AV segment: Normal in size with minor irregularities.  Posterolateral branchs:  2 medium posterolateral branches with minor irregularities  Left ventriculography: Left ventricular systolic function is mildly to moderately reduced , LVEF is estimated at 40 %, there is mild mitral regurgitation . There is severe hypokinesis of the whole inferior wall.  PCI Note:  Following the diagnostic procedure, the decision was made to proceed with PCI.  Weight-based bivalirudin was given for anticoagulation. Once a therapeutic ACT was achieved, a 6 [place] JR 4 guide catheter was inserted.  A Runthrough coronary guidewire was used to cross the lesion.  The lesion was predilated with a 2.5 x 15 balloon. There was significant thrombus noted. Thus I used an aspiration thrombectomy with 1 pass. Large dark thrombus was retrieved. The lesion was then stented with a 3.0 x 18 mm Xience drug-eluting stent.  The stent was postdilated with a 3.25 noncompliant balloon.  Following PCI, there was 0% residual stenosis and TIMI-3 flow. Final angiography confirmed an excellent result. There was residual 60% disease in the PDA which was left to be treated medically. The patient tolerated the procedure well. There were no immediate procedural complications. A TR band was used for radial hemostasis. The patient was transferred to the post catheterization recovery area for further monitoring.  PCI Data: Vessel - right coronary artery/Segment - distal  Percent Stenosis (pre)  100% TIMI-flow 0 Stent : 3.0 x 18 mm Xience drug-eluting stent Percent Stenosis (post) 0 TIMI-flow (post) 3   Final Conclusions:  1. Severe  one-vessel coronary artery disease with thrombotic occlusion of the distal right coronary artery. There is also moderate disease in  the mid left circumflex, mid RCA and right PDA. 2. Mildly reduced LV systolic function with severe inferior wall hypokinesis. 3. Mildly elevated left ventricular end-diastolic pressure. 4. Successful angioplasty and aspiration thrombectomy with drug-eluting stent placement to the right coronary artery.  Recommendations:  Continue dual antiplatelet therapy for at least one year. Start high-dose statin and small dose metoprolol. The patient had idioventricular rhythm throughout the case which was intermittent. Obtain an echocardiogram. Aggressive medical treatment is recommended for residual coronary artery disease.  Lorine BearsMuhammad Arida MD, Indiana University Health Blackford HospitalFACC 03/07/2014, 8:37 AM

## 2014-03-07 NOTE — Progress Notes (Signed)
CARDIAC REHAB PHASE I   PRE:  Rate/Rhythm: 54 SB  BP:  Sitting: 91/61, recheck sitting on side of bed 63/64     SaO2: 99 2L  MODE:  Ambulation: 150 ft   POST:  Rate/Rhythm: 90 SR  BP:  Sitting: 86/59, primary RN aware and in room    SaO2: 94 RA  Consent from nurse before ambulation.  Pt tolerated walk well, while stabilizing arm.  Pt walked 150 ft with no c/o and no SOB on RA.  Pt O2sat remained stable during ambulation.  Pt was encouraged to walk again this afternoon with nursing staff.  Wife at present at bedside, returned pt to recliner with phone and call bell in reach.  [phone]  Marvene Staff MS, ACSM RCEP 3:33 PM 03/07/2014

## 2014-03-07 NOTE — Progress Notes (Signed)
ANTICOAGULATION CONSULT NOTE - Initial Consult  Pharmacy Consult for Heparin Indication: chest pain/ACS  Allergies  Allergen Reactions  . Penicillins     REACTION: as a child--unsure of reaction    Patient Measurements: Height: 5' 8.11" (173 cm) Weight: 197 lb 5 oz (89.5 kg) IBW/kg (Calculated) : 68.65 Heparin Dosing Weight: 74 kg  Vital Signs: Temp: 97.6 F (36.4 C) (06/20 0647) Temp src: Oral (06/20 0647) BP: 118/75 mmHg (06/20 0653) Pulse Rate: 63 (06/20 0653)  Labs:  Recent Labs  03/07/14 0649  HGB 16.2  HCT 46.5  PLT 208    Estimated Creatinine Clearance: 87.7 ml/min (by C-G formula based on Cr of 1).   Medical History: Past Medical History  Diagnosis Date  . Allergy, unspecified not elsewhere classified   . Palpitations   . Hypercholesteremia   . Abdominal pain, left lower quadrant   . Elevated prostate specific antigen (PSA)   . DJD (degenerative joint disease)   . Gout   . Lumbar back pain     Medications:  Scheduled:  . metoprolol      . nitroGLYCERIN       Infusions:  . heparin 900 Units/hr (03/07/14 0713)    Assessment: 58 yoM c/o epigastric pain, nausea/vomiting.  Now with troponin=5.42.    Goal of Therapy:  Heparin level 0.3-0.7 units/ml Monitor platelets by anticoagulation protocol: Yes   Plan:   Baseline coags stat  Heparin 4000 unit bolus x1  Start drip @ 900 units/hr  Daily CBC/HL  Check 1st HL in 6 hours  Lorenza Evangelist 03/07/2014,7:14 AM

## 2014-03-07 NOTE — Plan of Care (Signed)
Problem: Phase I Progression Outcomes Goal: Hemodynamically stable Outcome: Progressing Hr SB ,HR = 40'S ,  PRIOR TO METOPROLOL , hr = 90'S , W/FREQ RUNS pvc'S Goal: Anginal pain relieved Outcome: Progressing DENIES PAIN

## 2014-03-07 NOTE — Progress Notes (Signed)
  Echocardiogram 2D Echocardiogram has been performed.  Leta Jungling M 03/07/2014, 1:15 PM

## 2014-03-07 NOTE — ED Notes (Signed)
Shown labs to DR. Zavitz.

## 2014-03-08 DIAGNOSIS — I219 Acute myocardial infarction, unspecified: Secondary | ICD-10-CM

## 2014-03-08 DIAGNOSIS — I472 Ventricular tachycardia: Secondary | ICD-10-CM

## 2014-03-08 DIAGNOSIS — I4729 Other ventricular tachycardia: Secondary | ICD-10-CM

## 2014-03-08 LAB — CBC
HCT: 40.9 % (ref 39.0–52.0)
Hemoglobin: 13.7 g/dL (ref 13.0–17.0)
MCH: 28.1 pg (ref 26.0–34.0)
MCHC: 33.5 g/dL (ref 30.0–36.0)
MCV: 83.8 fL (ref 78.0–100.0)
Platelets: 164 K/uL (ref 150–400)
RBC: 4.88 MIL/uL (ref 4.22–5.81)
RDW: 13.9 % (ref 11.5–15.5)
WBC: 9.2 K/uL (ref 4.0–10.5)

## 2014-03-08 LAB — BASIC METABOLIC PANEL WITH GFR
BUN: 18 mg/dL (ref 6–23)
CO2: 22 meq/L (ref 19–32)
Calcium: 9.2 mg/dL (ref 8.4–10.5)
Chloride: 101 meq/L (ref 96–112)
Creatinine, Ser: 1.1 mg/dL (ref 0.50–1.35)
GFR calc Af Amer: 84 mL/min — ABNORMAL LOW
GFR calc non Af Amer: 72 mL/min — ABNORMAL LOW
Glucose, Bld: 103 mg/dL — ABNORMAL HIGH (ref 70–99)
Potassium: 4.2 meq/L (ref 3.7–5.3)
Sodium: 138 meq/L (ref 137–147)

## 2014-03-08 NOTE — ED Provider Notes (Signed)
Medical screening examination/treatment/procedure(s) were conducted as a shared visit with non-physician practitioner(s) or resident  and myself.  I personally evaluated the patient during the encounter and agree with the findings and plan unless otherwise indicated.    I have personally reviewed any xrays and/ or EKG's with the Cary Wilford and I agree with interpretation.   Patient presented with significant epigastric pain and diaphoresis since playing golf yesterday. No cardiac history known. Cholesterol medical history. On exam patient is mild diaphoresis, mild epigastric discomfort, neck supple, lungs clear. EKG reviewed on arrival showed new left bundle branch block with inferior elevation. This was reviewed to previous and these are all new findings in with concerning story code STEMI activated discussed the case with interventional cardiology and Gen. cardiology. Patient is transferred to [facility] for catheterization. Troponin was elevated in ER.  CRITICAL CARE Performed by: Enid Skeens   Total critical care time: 30 min  Critical care time was exclusive of separately billable procedures and treating other patients.  Critical care was necessary to treat or prevent imminent or life-threatening deterioration.  Critical care was time spent personally by me on the following activities: development of treatment plan with patient and/or surrogate as well as nursing, discussions with consultants, evaluation of patient's response to treatment, examination of patient, obtaining history from patient or surrogate, ordering and performing treatments and interventions, ordering and review of laboratory studies, ordering and review of radiographic studies, pulse oximetry and re-evaluation of patient's condition.   acute myocardial infarction, LBBB    Enid Skeens, MD 03/08/14 [phone]

## 2014-03-08 NOTE — Progress Notes (Signed)
Report given to Rivka Spring at [facility]. Pt safely handed off and transported to room 2H17 at this time.

## 2014-03-08 NOTE — Progress Notes (Signed)
TELEMETRY: Reviewed telemetry pt in NSR, last episode of NSVT yesterday at 16:40.: Filed Vitals:   03/08/14 0043 03/08/14 0427 03/08/14 0816 03/08/14 0919  BP: 100/63 112/59 104/76   Pulse:  53 52 75  Temp: 98.8 F (37.1 C) 98.6 F (37 C) 98.8 F (37.1 C)   TempSrc: Oral Oral Oral   Resp:  18 20   Height:      Weight:      SpO2:  97% 98%     Intake/Output Summary (Last 24 hours) at 03/08/14 0938 Last data filed at 03/08/14 0300  Gross per 24 hour  Intake    990 ml  Output   1450 ml  Net   -460 ml   Filed Weights   03/07/14 0708 03/07/14 1000  Weight: 197 lb 5 oz (89.5 kg) 188 lb 7.9 oz (85.5 kg)    Subjective Feels well. No chest pain or SOB.  Marland Kitchen aspirin  81 mg Oral Daily  . atorvastatin  80 mg Oral q1800  . metoprolol tartrate  12.5 mg Oral BID  . tamsulosin  0.4 mg Oral QPC supper  . ticagrelor  90 mg Oral BID      LABS: Basic Metabolic Panel:  Recent Labs  03/18/15 0649 03/08/14 0247  NA 140 138  K 4.6 4.2  CL 98 101  CO2 26 22  GLUCOSE 179* 103*  BUN 16 18  CREATININE 1.13 1.10  CALCIUM 10.2 9.2   Liver Function Tests:  Recent Labs  03/07/14 0649  AST 131*  ALT 44  ALKPHOS 107  BILITOT 1.0  PROT 7.7  ALBUMIN 4.4   No results found for this basename: LIPASE, AMYLASE,  in the last 72 hours CBC:  Recent Labs  03/07/14 0649 03/08/14 0247  WBC 11.9* 9.2  HGB 16.2 13.7  HCT 46.5 40.9  MCV 82.4 83.8  PLT 208 164   Cardiac Enzymes:  Recent Labs  03/07/14 0649  TROPONINI 10.53*   BNP: No results found for this basename: PROBNP,  in the last 72 hours D-Dimer: No results found for this basename: DDIMER,  in the last 72 hours Hemoglobin A1C: No results found for this basename: HGBA1C,  in the last 72 hours Fasting Lipid Panel: No results found for this basename: CHOL, HDL, LDLCALC, TRIG, CHOLHDL, LDLDIRECT,  in the last 72 hours Thyroid Function Tests: No results found for this basename: TSH, T4TOTAL, FREET3, T3FREE,  THYROIDAB,  in the last 72 hours   Radiology/Studies:  No results found.  Ecg: NSR Inferior infarct with Q waves and T wave inversion in 3 and avf. St elevation resolved.  Echo:Study Conclusions  - Left ventricle: Mid and basal inferior wall akinesis. The cavity size was normal. Wall thickness was increased in a pattern of mild LVH. The estimated ejection fraction was 45%. - Atrial septum: No defect or patent foramen ovale was identified. - Pericardium, extracardiac: A trivial pericardial effusion was identified posterior to the heart.    PHYSICAL EXAM General: Well developed, well nourished, in no acute distress. Head: Normocephalic, atraumatic, sclera non-icteric, oropharynx is clear Neck: Negative for carotid bruits. JVD not elevated. No adenopathy Lungs: Clear bilaterally to auscultation without wheezes, rales, or rhonchi. Breathing is unlabored. Heart: RRR S1 S2 without murmurs, rubs, or gallops.  Abdomen: Soft, non-tender, non-distended with normoactive bowel sounds. No hepatomegaly. No rebound/guarding. No obvious abdominal masses. Msk:  Strength and tone appears normal for age. Extremities: No clubbing, cyanosis or edema.  Distal pedal pulses are 2+  and equal bilaterally. No radial site hematoma. Neuro: Alert and oriented X 3. Moves all extremities spontaneously. Psych:  Responds to questions appropriately with a normal affect.  ASSESSMENT AND PLAN: 1. STEMI inferior s/p DES of distal RCA. EF 45%. No recurrent chest pain. Will transfer to floor today. Continue DAPT for one year. On statin and low dose metoprolol. ACEi on hold due to low BP.  2. NSVT secondary to reperfusion. Improved. Continue low dose metoprolol.  3. Hyperlipidemia. On high dose Crestor.   Present on Admission:  . ST elevation myocardial infarction (STEMI) of inferior wall  Signed, Dwayne Larsen, MDFACC 03/08/2014 9:38 AM

## 2014-03-09 ENCOUNTER — Telehealth: Payer: Self-pay | Admitting: Cardiovascular Disease

## 2014-03-09 LAB — POCT ACTIVATED CLOTTING TIME: Activated Clotting Time: 394 seconds

## 2014-03-09 LAB — CBC
HEMATOCRIT: 39.5 % (ref 39.0–52.0)
Hemoglobin: 13 g/dL (ref 13.0–17.0)
MCH: 27.7 pg (ref 26.0–34.0)
MCHC: 32.9 g/dL (ref 30.0–36.0)
MCV: 84.2 fL (ref 78.0–100.0)
PLATELETS: 157 10*3/uL (ref 150–400)
RBC: 4.69 MIL/uL (ref 4.22–5.81)
RDW: 13.8 % (ref 11.5–15.5)
WBC: 7.6 10*3/uL (ref 4.0–10.5)

## 2014-03-09 MED ORDER — TICAGRELOR 90 MG PO TABS: 90.0000 mg | ORAL_TABLET | Freq: Two times a day (BID) | ORAL | Status: DC

## 2014-03-09 MED ORDER — ATORVASTATIN CALCIUM 80 MG PO TABS: 80.0000 mg | ORAL_TABLET | Freq: Every day | ORAL | Status: DC

## 2014-03-09 MED ORDER — METOPROLOL TARTRATE 25 MG PO TABS: 12.5000 mg | ORAL_TABLET | Freq: Two times a day (BID) | ORAL | Status: DC

## 2014-03-09 MED ORDER — NITROGLYCERIN 0.4 MG SL SUBL: 0.4000 mg | SUBLINGUAL_TABLET | SUBLINGUAL | Status: DC | PRN

## 2014-03-09 MED FILL — Sodium Chloride IV Soln 0.9%: INTRAVENOUS | Qty: 50 | Status: AC

## 2014-03-09 NOTE — Telephone Encounter (Signed)
Called and spoke with patient who states he met Dr. Acie Fredrickson this morning at [facility] and Dr. Acie Fredrickson advised him to call me to schedule an appointment with him prior to him traveling out of town on July 8.  I scheduled patient for 7:45 am on 7/6; patient verbalized understanding and gratitude and verbalized understanding to bring current list of medications.

## 2014-03-09 NOTE — Care Management Note (Signed)
03-09-14 1020 CM did call [facility] on [address] and Marden Noble is available. Co pay will be 18.00. CM did make pt aware. No further needs from CM at this time. Gala Lewandowsky, RN BSN [phone]

## 2014-03-09 NOTE — Progress Notes (Signed)
CARDIAC REHAB PHASE I   PRE:  Rate/Rhythm: 70 SR    BP: sitting 102/64    SaO2:   MODE:  Ambulation: 550 ft   POST:  Rate/Rhythm: 70 SR    BP: sitting 120/70     SaO2:   Tolerated very well, BP appropriate. No c/o, feels good. Ed completed with pt and wife. Good reception and understanding. Interested in CRPII and will send referral to [place]. Gave Bril[place] booklet. [phone]   Elissa Lovett [place] CES, ACSM 03/09/2014 9:47 AM

## 2014-03-09 NOTE — Discharge Summary (Signed)
Physician Discharge Summary  Patient ID: Dwayne Larsen MRN: [id number] DOB/AGE: [birth year] 58 y.o.  Admit date: 03/07/2014 Discharge date: 03/09/2014  Primary Cardiologist: Dr. Elease HashimotoNahser  Admission Diagnoses: Inferior Wall STEMI  Discharge Diagnoses:  Active Problems:   ST elevation myocardial infarction (STEMI) of inferior wall   Discharged Condition: stable   HPI: The patient is a 58 year old male with no previous cardiac history. He has known history of hyperlipidemia and gout. He stopped his hyperlipidemia medications in May of this year. [facility] 6:30 am on 03/07/14 with a complaint of lower substernal chest discomfort. This started the day prior at 4 PM while he was playing golf. He initially thought it was related to a stomach problem and dehydration. He took in extra fluids but continued to have symptoms. He went to sleep and woke up with continued discomfort and thus he came to the emergency room. EKG on arrival showed left bundle branch block with inferior ST elevation.   Hospital Course:   1. Inferior STEMI/ Residual CAD: He underwent urgent LHC by Dr. Kirke CorinArida via the right radial artery. He was found to have severe one-vessel coronary artery disease with thrombotic occlusion of the distal right coronary artery. This was successfully treated with aspiration thrombectomy with drug-eluting stent placement to the right coronary artery. He was also found to have moderate disease in the mid left circumflex (50% in mid segment), mid RCA (50%) and right PDA (60% in proximal portion). Medical therapy was recommended. Left ventriculography revealed mild to moderately reduced LV systolic function. EF was estimated at 45%. There was severe hypokinesis of the whole inferior wall. He left the cath lab in stable condition. He was placed on DAPT with ASA and Brilinta. He was also started on a BB, 12.5 mg of Lopressor and a high dose statin, 80 mg of Lipitor. No ACE was initiated due  to soft BP. He had no post cath complications. He had no recurrent CP. He ambulated with cardiac rehab without difficulty. His BP, HR and renal function remained stable, as did his right radial access site. He was noted to have a few runs of NSVT, however this was felt to be secondary to reperfusion. This improved after addition of a BB. He will need to resume DAPT for a minimum of 1 year. He was provided a prescription for PRN NTG. He was given instruction on the proper use of SL NTG and when it is appropriate to call 911.   2. LV Systolic Dysfunction: EF mild-moderately reduced at 45%, in the setting of recent STEMI. ACE-I was not initiated, due to soft BP. He was evoluemic at time of discharge.   2. HLD: Most recent lipid panel was 01/15/14 which revealed an elevated LDL of 108 mg/dL and elevated triglycerides at 195 mg/dL. Goal LDL, in the setting of recent STEMI and residual CAD, is < 70 mg/dL. He was placed on a high dose statin, 80 mg of Lipitor daily. He will need repeat testing in several months as an OP. If no significant reduction in LDL, then he may benefit from initiation of Zetia.    The patient had no other issues. On Hospital Day 2, he was last seen and examined by Dr. Elease HashimotoNahser, who determined he was stable for discharge home. Post-hospital f/u was arranged with Tereso NewcomerScott Weaver, PA-C. He will be followed long term by Dr. Elease HashimotoNahser.  Consults: None  Significant Diagnostic Studies:   Left Heart Catheterization 03/07/14 Hemodynamics:  AO: 106/73 mmHg  LV: 104/13 mmHg  LVEDP: 20 mmHg  Coronary angiography:  Coronary dominance: Right  Left Main: The normal  Left Anterior Descending (LAD): Normal in size with diffuse 20% disease in the proximal segment and minor irregularities elsewhere. He the vessel is overall moderately calcified.  1st diagonal (D1): Normal in size with minor irregularities.  2nd diagonal (D2): Small in size with minor irregularities.  3rd diagonal (D3): Normal in size  with minor irregularities.  Circumflex (LCx): Normal in size and nondominant. It is 50% stenosis in the midsegment. The vessel is mildly calcified  1st obtuse marginal: Normal in size with minor irregularities.  2nd obtuse marginal: Normal in size with no significant disease.  3rd obtuse marginal: Small in size with no significant disease.  Right Coronary Artery: Large in size and dominant. There is 50% stenosis in the midsegment. The vessel is occluded distally before bifurcation with large thrombus.  Posterior descending artery: Normal in size with 60% stenosis in the proximal and midsegment.  Posterior AV segment: Normal in size with minor irregularities.  Posterolateral branchs: 2 medium posterolateral branches with minor irregularities Left ventriculography: Left ventricular systolic function is mildly to moderately reduced , LVEF is estimated at 40 %, there is mild mitral regurgitation . There is severe hypokinesis of the whole inferior wall.   2D echo 03/07/14 Study Conclusions  - Left ventricle: Mid and basal inferior wall akinesis. The cavity size was normal. Wall thickness was increased in a pattern of mild LVH. The estimated ejection fraction was 45%. - Atrial septum: No defect or patent foramen ovale was identified. - Pericardium, extracardiac: A trivial pericardial effusion was identified posterior to the heart.    Lipid Panel 01/15/14    Component Value Date/Time   CHOL 186 01/15/2014 0735   TRIG 195.0* 01/15/2014 0735   HDL 39.40 01/15/2014 0735   CHOLHDL 5 01/15/2014 0735   VLDL 39.0 01/15/2014 0735   LDLCALC 108* 01/15/2014 0735      Treatments: See Hospital Course  Discharge Exam: Blood pressure 106/68, pulse 62, temperature 98.3 F (36.8 C), temperature source Oral, resp. rate 16, height 5\' 8"  (1.727 m), weight 195 lb 1.7 oz (88.5 kg), SpO2 99.00%.   Disposition: 01-Home or Self Care      Discharge Instructions   Amb Referral to Cardiac Rehabilitation     Complete by:  As directed      Diet - low sodium heart healthy    Complete by:  As directed      Increase activity slowly    Complete by:  As directed             Medication List    STOP taking these medications       simvastatin 20 MG tablet  Commonly known as:  ZOCOR      TAKE these medications       aspirin 81 MG tablet  Take 81 mg by mouth daily.     atorvastatin 80 MG tablet  Commonly known as:  LIPITOR  Take 1 tablet (80 mg total) by mouth daily at 6 PM.     BIOFREEZE ROLL-ON 4 % Gel  Generic drug:  Menthol (Topical Analgesic)  Apply 1 application topically daily as needed (for pain).     colchicine 0.6 MG tablet  Take 0.6 mg by mouth as needed (for gout flareups).     meloxicam 15 MG tablet  Commonly known as:  MOBIC  Take 15 mg by mouth daily as needed (for swelling or  pain).     metoprolol tartrate 25 MG tablet  Commonly known as:  LOPRESSOR  Take 0.5 tablets (12.5 mg total) by mouth 2 (two) times daily.     nitroGLYCERIN 0.4 MG SL tablet  Commonly known as:  NITROSTAT  Place 1 tablet (0.4 mg total) under the tongue every 5 (five) minutes as needed for chest pain.     oxymetazoline 0.05 % nasal spray  Commonly known as:  AFRIN  Place 1 spray into both nostrils daily.     ticagrelor 90 MG Tabs tablet  Commonly known as:  BRILINTA  Take 1 tablet (90 mg total) by mouth 2 (two) times daily.     ticagrelor 90 MG Tabs tablet  Commonly known as:  BRILINTA  Take 1 tablet (90 mg total) by mouth 2 (two) times daily.     zolpidem 10 MG tablet  Commonly known as:  AMBIEN  Take 10 mg by mouth at bedtime as needed for sleep.       Follow-up Information   Follow up with Tereso Newcomer, PA-C On 04/02/2014. (11:50 am (Dr. Harvie Bridge PA.))    Specialty:  Physician Assistant   Contact information:   [address] [place] [address] [phone]      TIME SPENT ON DISCHARGE, INCLUDING PHYSICIAN TIME: >30 MINUTES  Signed: Robbie Lis 03/09/2014, 1:00 PM  Attending Note:   The patient was seen and examined.  Agree with assessment and plan as noted above.  Changes made to the above note as needed.  Pt is doing well.   No CP since PCI. Will see him in several week.    Vesta Mixer, Montez Hageman., MD, [facility] 03/09/2014, 3:28 PM

## 2014-03-09 NOTE — Progress Notes (Signed)
Pt presented with an inferior MI on Sat.  S/p PCI.  Doing well.  Ready to go home   TELEMETRY:   NSR     Filed Vitals:   03/08/14 1342 03/08/14 1858 03/08/14 2054 03/09/14 0522  BP: 95/59 118/71 112/68 91/49  Pulse: 62  64 57  Temp: 98.3 F (36.8 C)  97.8 F (36.6 C) 98.3 F (36.8 C)  TempSrc: Oral  Oral Oral  Resp: 18  16 16   Height: 5\' 8"  (1.727 m)     Weight: 195 lb 1.7 oz (88.5 kg)     SpO2: 97%  100% 99%    Intake/Output Summary (Last 24 hours) at 03/09/14 0830 Last data filed at 03/08/14 2002  Gross per 24 hour  Intake    610 ml  Output    300 ml  Net    310 ml   Filed Weights   03/07/14 0708 03/07/14 1000 03/08/14 1342  Weight: 197 lb 5 oz (89.5 kg) 188 lb 7.9 oz (85.5 kg) 195 lb 1.7 oz (88.5 kg)    Subjective Feels well. No chest pain or SOB.  Marland Kitchen aspirin  81 mg Oral Daily  . atorvastatin  80 mg Oral q1800  . metoprolol tartrate  12.5 mg Oral BID  . tamsulosin  0.4 mg Oral QPC supper  . ticagrelor  90 mg Oral BID      LABS: Basic Metabolic Panel:  Recent Labs  68/03/21 0649 03/08/14 0247  NA 140 138  K 4.6 4.2  CL 98 101  CO2 26 22  GLUCOSE 179* 103*  BUN 16 18  CREATININE 1.13 1.10  CALCIUM 10.2 9.2   Liver Function Tests:  Recent Labs  03/07/14 0649  AST 131*  ALT 44  ALKPHOS 107  BILITOT 1.0  PROT 7.7  ALBUMIN 4.4   No results found for this basename: LIPASE, AMYLASE,  in the last 72 hours CBC:  Recent Labs  03/08/14 0247 03/09/14 0328  WBC 9.2 7.6  HGB 13.7 13.0  HCT 40.9 39.5  MCV 83.8 84.2  PLT 164 157   Cardiac Enzymes:  Recent Labs  03/07/14 0649  TROPONINI 10.53*   BNP: No results found for this basename: PROBNP,  in the last 72 hours D-Dimer: No results found for this basename: DDIMER,  in the last 72 hours Hemoglobin A1C: No results found for this basename: HGBA1C,  in the last 72 hours Fasting Lipid Panel: No results found for this basename: CHOL, HDL, LDLCALC, TRIG, CHOLHDL, LDLDIRECT,  in  the last 72 hours Thyroid Function Tests: No results found for this basename: TSH, T4TOTAL, FREET3, T3FREE, THYROIDAB,  in the last 72 hours   Radiology/Studies:  No results found.  Ecg: NSR Inferior infarct with Q waves and T wave inversion in 3 and avf. St elevation resolved.  Echo:Study Conclusions  - Left ventricle: Mid and basal inferior wall akinesis. The cavity size was normal. Wall thickness was increased in a pattern of mild LVH. The estimated ejection fraction was 45%. - Atrial septum: No defect or patent foramen ovale was identified. - Pericardium, extracardiac: A trivial pericardial effusion was identified posterior to the heart.    PHYSICAL EXAM General: Well developed, well nourished, in no acute distress. Head: Normocephalic, atraumatic, sclera non-icteric, oropharynx is clear Neck: Negative for carotid bruits. JVD not elevated. No adenopathy Lungs: Clear bilaterally to auscultation without wheezes, rales, or rhonchi. Breathing is unlabored. Heart: RRR S1 S2 without murmurs, rubs, or gallops.  Abdomen:  Soft, non-tender, non-distended with normoactive bowel sounds. No hepatomegaly. No rebound/guarding. No obvious abdominal masses. Msk:  Strength and tone appears normal for age. Extremities: No clubbing, cyanosis or edema.  Distal pedal pulses are 2+ and equal bilaterally. No radial site hematoma. Neuro: Alert and oriented X 3. Moves all extremities spontaneously. Psych:  Responds to questions appropriately with a normal affect.  ASSESSMENT AND PLAN: 1. STEMI inferior s/p DES of distal RCA. EF 45%. No recurrent chest pain.. Continue DAPT for one year. On statin and low dose metoprolol. ACEi on hold due to low BP. Will have cardiac rehab see him today before he leaves   2. NSVT secondary to reperfusion. Improved. Continue low dose metoprolol.  3. Hyperlipidemia. On high dose Crestor.   Could change to Atorvastatin if needed.   Present on Admission:  . ST  elevation myocardial infarction (STEMI) of inferior wall   Script to go to [facility] on [address]. He may need samples of Brilinta ( or a discount card) PA - please visit patient prior to DC to review all of their questions. Please include my contact information on the DC instructions.  I would be happy to see him in follow up.  He initially saw VenezuelaArida but will need to be assigned to someone else.    Vesta MixerPhilip J. Nahser, Montez HagemanJr., MD, [facility] 03/09/2014, 8:33 AM [address],  [address] Office [phone] Pager [phone]

## 2014-03-09 NOTE — Telephone Encounter (Signed)
New message    Patient was told by Dr. Elease Hashimoto to call speak with Marcelino Duster to get a sooner appt.

## 2014-03-23 ENCOUNTER — Encounter: Payer: Self-pay | Admitting: Cardiovascular Disease

## 2014-03-23 ENCOUNTER — Ambulatory Visit (INDEPENDENT_AMBULATORY_CARE_PROVIDER_SITE_OTHER): Payer: BC Managed Care – PPO | Admitting: Cardiovascular Disease

## 2014-03-23 VITALS — BP 117/75 | HR 61 | Ht 68.0 in | Wt 195.0 lb

## 2014-03-23 DIAGNOSIS — I2119 ST elevation (STEMI) myocardial infarction involving other coronary artery of inferior wall: Secondary | ICD-10-CM

## 2014-03-23 MED ORDER — LOSARTAN POTASSIUM 25 MG PO TABS: 25.0000 mg | ORAL_TABLET | Freq: Every day | ORAL | Status: DC

## 2014-03-23 NOTE — Patient Instructions (Addendum)
Your physician has recommended you make the following change in your medication:  START Losartan 25 mg once daily  Your physician wants you to follow-up in: 3 months with Dr. Elease Hashimoto.  You will receive a reminder letter in the mail two months in advance. If you don't receive a letter, please call our office to schedule the follow-up appointment.   Your physician recommends that you return for lab work in: 3 months on the day of or a few days before your office visit with Dr. Elease Hashimoto.  You will need to FAST for this appointment - nothing to eat or drink after midnight the night before except water.

## 2014-03-23 NOTE — Progress Notes (Addendum)
Dwayne Larsen Date of Birth  1956/01/19       [facility] Office [address], [address]  [address] [place], [place]  [address]   [place], [place]  [address] [phone]     [phone]   Fax  [phone]     Fax [phone]  Problem List: 1. CAD 2.  Hyperlipidemia   History of Present Illness:  Dwayne Larsen is ding well.        Current Outpatient Prescriptions on File Prior to Visit  Medication Sig Dispense Refill  . aspirin 81 MG tablet Take 81 mg by mouth daily.        Marland Kitchen atorvastatin (LIPITOR) 80 MG tablet Take 1 tablet (80 mg total) by mouth daily at 6 PM.  30 tablet  5  . colchicine 0.6 MG tablet Take 0.6 mg by mouth as needed (for gout flareups).       . meloxicam (MOBIC) 15 MG tablet Take 15 mg by mouth daily as needed (for swelling or pain).       . Menthol, Topical Analgesic, (BIOFREEZE ROLL-ON) 4 % GEL Apply 1 application topically daily as needed (for pain).      . metoprolol tartrate (LOPRESSOR) 25 MG tablet Take 0.5 tablets (12.5 mg total) by mouth 2 (two) times daily.  60 tablet  5  . nitroGLYCERIN (NITROSTAT) 0.4 MG SL tablet Place 1 tablet (0.4 mg total) under the tongue every 5 (five) minutes as needed for chest pain.  25 tablet  2  . oxymetazoline (AFRIN) 0.05 % nasal spray Place 1 spray into both nostrils daily.      . ticagrelor (BRILINTA) 90 MG TABS tablet Take 1 tablet (90 mg total) by mouth 2 (two) times daily.  60 tablet  10  . zolpidem (AMBIEN) 10 MG tablet Take 10 mg by mouth at bedtime as needed for sleep.       No current facility-administered medications on file prior to visit.    Allergies  Allergen Reactions  . Other Itching and Rash    Walnuts and pecans  . Penicillins Other (See Comments)    REACTION: as a child--unsure of reaction    Past Medical History  Diagnosis Date  . Allergy, unspecified not elsewhere classified   . Palpitations   . Hypercholesteremia   . Abdominal pain, left lower  quadrant   . Elevated prostate specific antigen (PSA)   . DJD (degenerative joint disease)   . Gout   . Lumbar back pain     Past Surgical History  Procedure Laterality Date  . Knee arthroscopy      right  . Laminectomy and microdiscectomy lumbar spine  2003    L4-5  . Rectal surgery  2003    Dr. Orson Slick    History  Smoking status  . Former Smoker -- 1.00 packs/day for 10 years  . Types: Cigarettes  . Quit date: 09/18/1980  Smokeless tobacco  . Never Used    Comment: 1/2-1 ppd, ages [age]    History  Alcohol Use  . Yes    Comment: social    Family History  Problem Relation Age of Onset  . Coronary artery disease Father   . Hyperlipidemia Father   . Deep vein thrombosis Father     Reviw of Systems:  Reviewed in the HPI.  All other systems are negative.  Physical Exam: Blood pressure 117/75, pulse 61, height 5\' 8"  (1.727 m), weight  195 lb (88.451 kg). Wt Readings from Last 3 Encounters:  03/23/14 195 lb (88.451 kg)  03/08/14 195 lb 1.7 oz (88.5 kg)  03/08/14 195 lb 1.7 oz (88.5 kg)     General: Well developed, well nourished, in no acute distress.  Head: Normocephalic, atraumatic, sclera non-icteric, mucus membranes are moist,   Neck: Supple. Carotids are 2 + without bruits. No JVD   Lungs: Clear   Heart:   Abdomen: Soft, non-tender, non-distended with normal bowel sounds.  Msk:  Strength and tone are normal   Extremities: No clubbing or cyanosis. No edema.  Distal pedal pulses are 2+ and equal    Neuro: CN II - XII intact.  Alert and oriented X 3.   Psych:  Normal   ECG: 03/23/2014: Normal sinus rhythm at 61. He is a previous inferior wall myocardial infarction. There are no acute changes.  Assessment / Plan:

## 2014-03-23 NOTE — Assessment & Plan Note (Signed)
Dwayne Larsen is doing very well following his inferior wall myocardial infarction. He's tolerating his medicines without difficulty.  He has had more gas and bloating since his MI.  May be the increased in green vegetables.  No GI bleeding.  No CP. No dyspne  His left systolic function is minimally depressed at 45%. He's on low dose beta blocker. Will add low-dose losartan-25 mg a day to see if his LV function to improve. I'll see him in 3 months for followup office visit, fasting lipids, liver enzymes, and basic metabolic profile.

## 2014-04-02 ENCOUNTER — Inpatient Hospital Stay (HOSPITAL_COMMUNITY)
Admission: RE | Admit: 2014-04-02 | Discharge: 2014-04-02 | Disposition: A | Discharge status: Home / Self Care | Payer: BC Managed Care – PPO | Source: Ambulatory Visit

## 2014-04-02 ENCOUNTER — Encounter: Payer: BC Managed Care – PPO | Admitting: Physician Assistant

## 2014-04-02 NOTE — Progress Notes (Signed)
Cardiac Rehab Medication Review by a Pharmacist  Does the patient  feel that his/her medications are working for him/her?  yes  Has the patient been experiencing any side effects to the medications prescribed?  Yes - bloating and gas at night  Does the patient measure his/her own blood pressure or blood glucose at home?  no   Does the patient have any problems obtaining medications due to transportation or finances?   no  Understanding of regimen: good Understanding of indications: good Potential of compliance: good   Pharmacist comments: Patient had a good understanding of his medications today. He brought a medication list with him.  Christiane Ha A. Lenon Ahmadi, PharmD Clinical Pharmacist - Resident Pager: [phone] Pharmacy: [phone] 04/02/2014 8:48 AM

## 2014-04-06 ENCOUNTER — Encounter (HOSPITAL_COMMUNITY): Payer: Self-pay

## 2014-04-06 ENCOUNTER — Encounter (HOSPITAL_COMMUNITY)
Admission: RE | Admit: 2014-04-06 | Discharge: 2014-04-06 | Disposition: A | Discharge status: Home / Self Care | Payer: BC Managed Care – PPO | Source: Ambulatory Visit | Attending: Cardiovascular Disease | Admitting: Cardiovascular Disease

## 2014-04-06 DIAGNOSIS — I2119 ST elevation (STEMI) myocardial infarction involving other coronary artery of inferior wall: Secondary | ICD-10-CM | POA: Insufficient documentation

## 2014-04-06 DIAGNOSIS — R002 Palpitations: Secondary | ICD-10-CM | POA: Insufficient documentation

## 2014-04-06 DIAGNOSIS — Z5189 Encounter for other specified aftercare: Secondary | ICD-10-CM | POA: Diagnosis not present

## 2014-04-06 NOTE — Progress Notes (Addendum)
Pt started cardiac rehab today.  Pt tolerated light exercise without difficulty.  VSS, telemetry-NSR, non specific st-t wave changes with t wave inversion.  Asymptomatic.  PHQ-0. Pt exhibits no barriers to rehab participation.  Pt has positive outlook with good coping skills.  Pt goals for cardiac rehab are to return to playing golf and restore his health.  Pt oriented to exercise equipment and routine.  Understanding verbalized.

## 2014-04-08 ENCOUNTER — Encounter (HOSPITAL_COMMUNITY)
Admission: RE | Admit: 2014-04-08 | Discharge: 2014-04-08 | Disposition: A | Discharge status: Home / Self Care | Payer: BC Managed Care – PPO | Source: Ambulatory Visit | Attending: Cardiovascular Disease | Admitting: Cardiovascular Disease

## 2014-04-08 DIAGNOSIS — Z5189 Encounter for other specified aftercare: Secondary | ICD-10-CM | POA: Diagnosis not present

## 2014-04-08 NOTE — Progress Notes (Signed)
PSYCHOSOCIAL ASSESSMENT  Pt psychosocial assessment reveals no barriers to rehab participation.  Pt exhibits positive coping skills and has supportive family.  Offered emotional support and reassurance.  Will continue to monitor.

## 2014-04-10 ENCOUNTER — Encounter (HOSPITAL_COMMUNITY): Payer: BC Managed Care – PPO

## 2014-04-13 ENCOUNTER — Encounter (HOSPITAL_COMMUNITY): Payer: Self-pay

## 2014-04-13 ENCOUNTER — Encounter (HOSPITAL_COMMUNITY)
Admission: RE | Admit: 2014-04-13 | Discharge: 2014-04-13 | Disposition: A | Discharge status: Home / Self Care | Payer: BC Managed Care – PPO | Source: Ambulatory Visit | Attending: Cardiovascular Disease | Admitting: Cardiovascular Disease

## 2014-04-13 DIAGNOSIS — Z5189 Encounter for other specified aftercare: Secondary | ICD-10-CM | POA: Diagnosis not present

## 2014-04-13 NOTE — Progress Notes (Signed)
Reviewed home exercise with pt today.  Pt plans to walk 30 minutes on 5-7 days a week.  Reviewed THR, pulse, RPE, sign and symptoms, NTG use, and when to call 911 or MD.  Pt voiced understanding.  

## 2014-04-15 ENCOUNTER — Encounter (HOSPITAL_COMMUNITY)
Admission: RE | Admit: 2014-04-15 | Discharge: 2014-04-15 | Disposition: A | Discharge status: Home / Self Care | Payer: BC Managed Care – PPO | Source: Ambulatory Visit | Attending: Cardiovascular Disease | Admitting: Cardiovascular Disease

## 2014-04-15 DIAGNOSIS — Z5189 Encounter for other specified aftercare: Secondary | ICD-10-CM | POA: Diagnosis not present

## 2014-04-17 ENCOUNTER — Encounter (HOSPITAL_COMMUNITY)
Admission: RE | Admit: 2014-04-17 | Discharge: 2014-04-17 | Disposition: A | Discharge status: Home / Self Care | Payer: BC Managed Care – PPO | Source: Ambulatory Visit | Attending: Cardiovascular Disease | Admitting: Cardiovascular Disease

## 2014-04-17 DIAGNOSIS — Z5189 Encounter for other specified aftercare: Secondary | ICD-10-CM | POA: Diagnosis not present

## 2014-04-20 ENCOUNTER — Encounter (HOSPITAL_COMMUNITY)
Admission: RE | Admit: 2014-04-20 | Discharge: 2014-04-20 | Disposition: A | Discharge status: Home / Self Care | Payer: BC Managed Care – PPO | Source: Ambulatory Visit | Attending: Cardiovascular Disease | Admitting: Cardiovascular Disease

## 2014-04-20 DIAGNOSIS — I219 Acute myocardial infarction, unspecified: Secondary | ICD-10-CM | POA: Insufficient documentation

## 2014-04-20 DIAGNOSIS — Z9861 Coronary angioplasty status: Secondary | ICD-10-CM | POA: Diagnosis present

## 2014-04-22 ENCOUNTER — Encounter (HOSPITAL_COMMUNITY)
Admission: RE | Admit: 2014-04-22 | Discharge: 2014-04-22 | Disposition: A | Discharge status: Home / Self Care | Payer: BC Managed Care – PPO | Source: Ambulatory Visit | Attending: Cardiovascular Disease | Admitting: Cardiovascular Disease

## 2014-04-22 DIAGNOSIS — I219 Acute myocardial infarction, unspecified: Secondary | ICD-10-CM | POA: Diagnosis not present

## 2014-04-24 ENCOUNTER — Encounter (HOSPITAL_COMMUNITY): Payer: BC Managed Care – PPO

## 2014-04-27 ENCOUNTER — Encounter (HOSPITAL_COMMUNITY)
Admission: RE | Admit: 2014-04-27 | Discharge: 2014-04-27 | Disposition: A | Discharge status: Home / Self Care | Payer: BC Managed Care – PPO | Source: Ambulatory Visit | Attending: Cardiovascular Disease | Admitting: Cardiovascular Disease

## 2014-04-27 DIAGNOSIS — I219 Acute myocardial infarction, unspecified: Secondary | ICD-10-CM | POA: Diagnosis not present

## 2014-04-29 ENCOUNTER — Encounter (HOSPITAL_COMMUNITY): Payer: BC Managed Care – PPO

## 2014-05-01 ENCOUNTER — Encounter (HOSPITAL_COMMUNITY)
Admission: RE | Admit: 2014-05-01 | Discharge: 2014-05-01 | Disposition: A | Discharge status: Home / Self Care | Payer: BC Managed Care – PPO | Source: Ambulatory Visit | Attending: Cardiovascular Disease | Admitting: Cardiovascular Disease

## 2014-05-01 DIAGNOSIS — I219 Acute myocardial infarction, unspecified: Secondary | ICD-10-CM | POA: Diagnosis not present

## 2014-05-04 ENCOUNTER — Encounter (HOSPITAL_COMMUNITY)
Admission: RE | Admit: 2014-05-04 | Discharge: 2014-05-04 | Disposition: A | Discharge status: Home / Self Care | Payer: BC Managed Care – PPO | Source: Ambulatory Visit | Attending: Cardiovascular Disease | Admitting: Cardiovascular Disease

## 2014-05-04 DIAGNOSIS — I219 Acute myocardial infarction, unspecified: Secondary | ICD-10-CM | POA: Diagnosis not present

## 2014-05-06 ENCOUNTER — Encounter (HOSPITAL_COMMUNITY): Payer: BC Managed Care – PPO

## 2014-05-08 ENCOUNTER — Encounter (HOSPITAL_COMMUNITY): Payer: BC Managed Care – PPO

## 2014-05-11 ENCOUNTER — Encounter (HOSPITAL_COMMUNITY): Payer: BC Managed Care – PPO

## 2014-05-13 ENCOUNTER — Encounter (HOSPITAL_COMMUNITY)
Admission: RE | Admit: 2014-05-13 | Discharge: 2014-05-13 | Disposition: A | Discharge status: Home / Self Care | Payer: BC Managed Care – PPO | Source: Ambulatory Visit | Attending: Cardiovascular Disease | Admitting: Cardiovascular Disease

## 2014-05-13 DIAGNOSIS — I219 Acute myocardial infarction, unspecified: Secondary | ICD-10-CM | POA: Diagnosis not present

## 2014-05-15 ENCOUNTER — Encounter (HOSPITAL_COMMUNITY)
Admission: RE | Admit: 2014-05-15 | Discharge: 2014-05-15 | Disposition: A | Discharge status: Home / Self Care | Payer: BC Managed Care – PPO | Source: Ambulatory Visit | Attending: Cardiovascular Disease | Admitting: Cardiovascular Disease

## 2014-05-15 DIAGNOSIS — I219 Acute myocardial infarction, unspecified: Secondary | ICD-10-CM | POA: Diagnosis not present

## 2014-05-18 ENCOUNTER — Encounter (HOSPITAL_COMMUNITY)
Admission: RE | Admit: 2014-05-18 | Discharge: 2014-05-18 | Disposition: A | Discharge status: Home / Self Care | Payer: BC Managed Care – PPO | Source: Ambulatory Visit | Attending: Cardiovascular Disease | Admitting: Cardiovascular Disease

## 2014-05-18 DIAGNOSIS — I219 Acute myocardial infarction, unspecified: Secondary | ICD-10-CM | POA: Diagnosis not present

## 2014-05-20 ENCOUNTER — Encounter (HOSPITAL_COMMUNITY)
Admission: RE | Admit: 2014-05-20 | Discharge: 2014-05-20 | Disposition: A | Discharge status: Home / Self Care | Payer: BC Managed Care – PPO | Source: Ambulatory Visit | Attending: Cardiovascular Disease | Admitting: Cardiovascular Disease

## 2014-05-20 DIAGNOSIS — Z9861 Coronary angioplasty status: Secondary | ICD-10-CM | POA: Diagnosis present

## 2014-05-20 DIAGNOSIS — I219 Acute myocardial infarction, unspecified: Secondary | ICD-10-CM | POA: Diagnosis present

## 2014-05-22 ENCOUNTER — Encounter (HOSPITAL_COMMUNITY)
Admission: RE | Admit: 2014-05-22 | Discharge: 2014-05-22 | Disposition: A | Discharge status: Home / Self Care | Payer: BC Managed Care – PPO | Source: Ambulatory Visit | Attending: Cardiovascular Disease | Admitting: Cardiovascular Disease

## 2014-05-22 DIAGNOSIS — I219 Acute myocardial infarction, unspecified: Secondary | ICD-10-CM | POA: Diagnosis not present

## 2014-05-22 NOTE — Progress Notes (Signed)
Dwayne Larsen 58 y.o. male Nutrition Note Spoke with pt.  Nutrition Survey reviewed with pt. Pt is not following the Therapeutic Lifestyle Changes diet at this time. Pt wants to lose wt. Pt has been trying to lose wt by exercising more. Wt loss tips reviewed. Pt expressed understanding of the information reviewed. Pt aware of nutrition education classes offered.  Nutrition Diagnosis   Food-and nutrition-related knowledge deficit related to lack of exposure to information as related to diagnosis of: ? CVD ?    Obesity related to excessive energy intake as evidenced by a BMI of 30.7  Nutrition Intervention   Benefits of adopting Therapeutic Lifestyle Changes discussed when Medficts reviewed.   Pt to attend the Portion Distortion class   Pt given handouts for: ? Nutrition I class ? Nutrition II class   Continue client-centered nutrition education by RD, as part of interdisciplinary care.  Goal(s)   Pt to identify and limit food sources of saturated fat, trans fat, and cholesterol   Pt to identify food quantities necessary to achieve: ? wt loss at graduation from cardiac rehab.   Monitor and Evaluate progress toward nutrition goal with team. Nutrition Risk:  Low   Mickle Plumb, M.Ed, RD, LDN, CDE 05/22/2014 10:30 AM

## 2014-05-25 ENCOUNTER — Encounter (HOSPITAL_COMMUNITY): Payer: BC Managed Care – PPO

## 2014-05-27 ENCOUNTER — Encounter (HOSPITAL_COMMUNITY)
Admission: RE | Admit: 2014-05-27 | Discharge: 2014-05-27 | Disposition: A | Discharge status: Home / Self Care | Payer: BC Managed Care – PPO | Source: Ambulatory Visit | Attending: Cardiovascular Disease | Admitting: Cardiovascular Disease

## 2014-05-27 DIAGNOSIS — I219 Acute myocardial infarction, unspecified: Secondary | ICD-10-CM | POA: Diagnosis not present

## 2014-05-29 ENCOUNTER — Encounter (HOSPITAL_COMMUNITY)
Admission: RE | Admit: 2014-05-29 | Discharge: 2014-05-29 | Disposition: A | Discharge status: Home / Self Care | Payer: BC Managed Care – PPO | Source: Ambulatory Visit | Attending: Cardiovascular Disease | Admitting: Cardiovascular Disease

## 2014-05-29 DIAGNOSIS — I219 Acute myocardial infarction, unspecified: Secondary | ICD-10-CM | POA: Diagnosis not present

## 2014-06-01 ENCOUNTER — Encounter (HOSPITAL_COMMUNITY)
Admission: RE | Admit: 2014-06-01 | Discharge: 2014-06-01 | Disposition: A | Discharge status: Home / Self Care | Payer: BC Managed Care – PPO | Source: Ambulatory Visit | Attending: Cardiovascular Disease | Admitting: Cardiovascular Disease

## 2014-06-01 DIAGNOSIS — I219 Acute myocardial infarction, unspecified: Secondary | ICD-10-CM | POA: Diagnosis not present

## 2014-06-03 ENCOUNTER — Encounter (HOSPITAL_COMMUNITY)
Admission: RE | Admit: 2014-06-03 | Discharge: 2014-06-03 | Disposition: A | Discharge status: Home / Self Care | Payer: BC Managed Care – PPO | Source: Ambulatory Visit | Attending: Cardiovascular Disease | Admitting: Cardiovascular Disease

## 2014-06-03 DIAGNOSIS — I219 Acute myocardial infarction, unspecified: Secondary | ICD-10-CM | POA: Diagnosis not present

## 2014-06-05 ENCOUNTER — Encounter (HOSPITAL_COMMUNITY)
Admission: RE | Admit: 2014-06-05 | Discharge: 2014-06-05 | Disposition: A | Discharge status: Home / Self Care | Payer: BC Managed Care – PPO | Source: Ambulatory Visit | Attending: Cardiovascular Disease | Admitting: Cardiovascular Disease

## 2014-06-05 DIAGNOSIS — I219 Acute myocardial infarction, unspecified: Secondary | ICD-10-CM | POA: Diagnosis not present

## 2014-06-08 ENCOUNTER — Encounter (HOSPITAL_COMMUNITY)
Admission: RE | Admit: 2014-06-08 | Discharge: 2014-06-08 | Disposition: A | Discharge status: Home / Self Care | Payer: BC Managed Care – PPO | Source: Ambulatory Visit | Attending: Cardiovascular Disease | Admitting: Cardiovascular Disease

## 2014-06-08 DIAGNOSIS — I219 Acute myocardial infarction, unspecified: Secondary | ICD-10-CM | POA: Diagnosis not present

## 2014-06-10 ENCOUNTER — Encounter (HOSPITAL_COMMUNITY)
Admission: RE | Admit: 2014-06-10 | Discharge: 2014-06-10 | Disposition: A | Discharge status: Home / Self Care | Payer: BC Managed Care – PPO | Source: Ambulatory Visit | Attending: Cardiovascular Disease | Admitting: Cardiovascular Disease

## 2014-06-10 DIAGNOSIS — I219 Acute myocardial infarction, unspecified: Secondary | ICD-10-CM | POA: Diagnosis not present

## 2014-06-12 ENCOUNTER — Encounter (HOSPITAL_COMMUNITY): Payer: BC Managed Care – PPO

## 2014-06-15 ENCOUNTER — Encounter (HOSPITAL_COMMUNITY)
Admission: RE | Admit: 2014-06-15 | Discharge: 2014-06-15 | Disposition: A | Discharge status: Home / Self Care | Payer: BC Managed Care – PPO | Source: Ambulatory Visit | Attending: Cardiovascular Disease | Admitting: Cardiovascular Disease

## 2014-06-15 DIAGNOSIS — I219 Acute myocardial infarction, unspecified: Secondary | ICD-10-CM | POA: Diagnosis not present

## 2014-06-16 ENCOUNTER — Other Ambulatory Visit (INDEPENDENT_AMBULATORY_CARE_PROVIDER_SITE_OTHER): Payer: BC Managed Care – PPO | Admitting: *Deleted

## 2014-06-16 DIAGNOSIS — I2119 ST elevation (STEMI) myocardial infarction involving other coronary artery of inferior wall: Secondary | ICD-10-CM

## 2014-06-16 LAB — LIPID PANEL
CHOLESTEROL: 121 mg/dL (ref 0–200)
HDL: 31.7 mg/dL — AB (ref >39.00)
NonHDL: 89.3
TRIGLYCERIDES: 210 mg/dL — AB (ref 0.0–149.0)
Total CHOL/HDL Ratio: 4
VLDL: 42 mg/dL — AB (ref 0.0–40.0)

## 2014-06-16 LAB — BASIC METABOLIC PANEL
BUN: 20 mg/dL (ref 6–23)
CALCIUM: 9.2 mg/dL (ref 8.4–10.5)
CO2: 21 mEq/L (ref 19–32)
CREATININE: 1.3 mg/dL (ref 0.4–1.5)
Chloride: 103 mEq/L (ref 96–112)
GFR: 58.06 mL/min — AB (ref >60.00)
Glucose, Bld: 101 mg/dL — ABNORMAL HIGH (ref 70–99)
Potassium: 4.2 mEq/L (ref 3.5–5.1)
Sodium: 137 mEq/L (ref 135–145)

## 2014-06-16 LAB — HEPATIC FUNCTION PANEL
ALBUMIN: 4.2 g/dL (ref 3.5–5.2)
ALT: 32 U/L (ref 0–53)
AST: 24 U/L (ref 0–37)
Alkaline Phosphatase: 93 U/L (ref 39–117)
BILIRUBIN TOTAL: 1.6 mg/dL — AB (ref 0.2–1.2)
Bilirubin, Direct: 0.1 mg/dL (ref 0.0–0.3)
Total Protein: 7 g/dL (ref 6.0–8.3)

## 2014-06-16 LAB — LDL CHOLESTEROL, DIRECT: Direct LDL: 61.1 mg/dL

## 2014-06-17 ENCOUNTER — Encounter (HOSPITAL_COMMUNITY)
Admission: RE | Admit: 2014-06-17 | Discharge: 2014-06-17 | Disposition: A | Discharge status: Home / Self Care | Payer: BC Managed Care – PPO | Source: Ambulatory Visit | Attending: Cardiovascular Disease | Admitting: Cardiovascular Disease

## 2014-06-17 DIAGNOSIS — I219 Acute myocardial infarction, unspecified: Secondary | ICD-10-CM | POA: Diagnosis not present

## 2014-06-19 ENCOUNTER — Encounter (HOSPITAL_COMMUNITY)
Admission: RE | Admit: 2014-06-19 | Discharge: 2014-06-19 | Disposition: A | Discharge status: Home / Self Care | Payer: BC Managed Care – PPO | Source: Ambulatory Visit | Attending: Cardiovascular Disease | Admitting: Cardiovascular Disease

## 2014-06-19 DIAGNOSIS — Z955 Presence of coronary angioplasty implant and graft: Secondary | ICD-10-CM | POA: Diagnosis present

## 2014-06-19 DIAGNOSIS — I213 ST elevation (STEMI) myocardial infarction of unspecified site: Secondary | ICD-10-CM | POA: Insufficient documentation

## 2014-06-22 ENCOUNTER — Encounter (HOSPITAL_COMMUNITY)
Admission: RE | Admit: 2014-06-22 | Discharge: 2014-06-22 | Disposition: A | Discharge status: Home / Self Care | Payer: BC Managed Care – PPO | Source: Ambulatory Visit | Attending: Cardiovascular Disease | Admitting: Cardiovascular Disease

## 2014-06-22 ENCOUNTER — Encounter: Payer: Self-pay | Admitting: Cardiovascular Disease

## 2014-06-22 ENCOUNTER — Ambulatory Visit (INDEPENDENT_AMBULATORY_CARE_PROVIDER_SITE_OTHER): Payer: BC Managed Care – PPO | Admitting: Cardiovascular Disease

## 2014-06-22 VITALS — BP 120/88 | HR 64 | Ht 67.0 in | Wt 197.5 lb

## 2014-06-22 DIAGNOSIS — I213 ST elevation (STEMI) myocardial infarction of unspecified site: Secondary | ICD-10-CM | POA: Diagnosis not present

## 2014-06-22 DIAGNOSIS — I5022 Chronic systolic (congestive) heart failure: Secondary | ICD-10-CM

## 2014-06-22 DIAGNOSIS — E785 Hyperlipidemia, unspecified: Secondary | ICD-10-CM

## 2014-06-22 DIAGNOSIS — I5020 Unspecified systolic (congestive) heart failure: Secondary | ICD-10-CM | POA: Insufficient documentation

## 2014-06-22 DIAGNOSIS — I2119 ST elevation (STEMI) myocardial infarction involving other coronary artery of inferior wall: Secondary | ICD-10-CM

## 2014-06-22 MED ORDER — FENOFIBRATE 160 MG PO TABS: 160.0000 mg | ORAL_TABLET | Freq: Every day | ORAL | Status: DC

## 2014-06-22 NOTE — Progress Notes (Signed)
Janifer Adie Date of Birth  02-Jun-1956       [facility] Office [address], [address]  [address] [place], [place]  [address]   [place], [place]  [address] [phone]     [phone]   Fax  [phone]     Fax [phone]  Problem List: 1. CAD 2.  Hyperlipidemia   History of Present Illness:  Cristhofer is ding well.    Oct. 5, 2015:  Iosefa is doing well from a cardiac standpoint. He's not having any episodes of chest pain or shortness of breath. Recent labs show that his triglyceride level is a little elevated. In addition, his bilirubin level is slightly elevated. He still exercises on a regular basis   Current Outpatient Prescriptions on File Prior to Visit  Medication Sig Dispense Refill  . aspirin 81 MG tablet Take 81 mg by mouth daily.        Marland Kitchen atorvastatin (LIPITOR) 80 MG tablet Take 1 tablet (80 mg total) by mouth daily at 6 PM.  30 tablet  5  . colchicine 0.6 MG tablet Take 0.6 mg by mouth as needed (for gout flareups).       . losartan (COZAAR) 25 MG tablet Take 1 tablet (25 mg total) by mouth daily.  90 tablet  3  . meloxicam (MOBIC) 15 MG tablet Take 15 mg by mouth daily as needed (for swelling or pain).       . metoprolol tartrate (LOPRESSOR) 25 MG tablet Take 0.5 tablets (12.5 mg total) by mouth 2 (two) times daily.  60 tablet  5  . nitroGLYCERIN (NITROSTAT) 0.4 MG SL tablet Place 1 tablet (0.4 mg total) under the tongue every 5 (five) minutes as needed for chest pain.  25 tablet  2  . oxymetazoline (AFRIN) 0.05 % nasal spray Place 1 spray into both nostrils daily.      . ticagrelor (BRILINTA) 90 MG TABS tablet Take 1 tablet (90 mg total) by mouth 2 (two) times daily.  60 tablet  10  . zolpidem (AMBIEN) 10 MG tablet Take 10 mg by mouth at bedtime as needed for sleep.       No current facility-administered medications on file prior to visit.    Allergies  Allergen Reactions  . Other Itching and Rash    Walnuts  and pecans  . Penicillins Other (See Comments)    REACTION: as a child--unsure of reaction    Past Medical History  Diagnosis Date  . Allergy, unspecified not elsewhere classified   . Palpitations   . Hypercholesteremia   . Abdominal pain, left lower quadrant   . Elevated prostate specific antigen (PSA)   . DJD (degenerative joint disease)   . Gout   . Lumbar back pain     Past Surgical History  Procedure Laterality Date  . Knee arthroscopy      right  . Laminectomy and microdiscectomy lumbar spine  2003    L4-5  . Rectal surgery  2003    Dr. Orson Slick    History  Smoking status  . Former Smoker -- 1.00 packs/day for 10 years  . Types: Cigarettes  . Quit date: 09/18/1980  Smokeless tobacco  . Never Used    Comment: 1/2-1 ppd, ages [age]    History  Alcohol Use  . Yes    Comment: social    Family History  Problem Relation Age of Onset  . Coronary artery  disease Father   . Hyperlipidemia Father   . Deep vein thrombosis Father     Reviw of Systems:  Reviewed in the HPI.  All other systems are negative.  Physical Exam: Blood pressure 120/88, pulse 64, height 5\' 7"  (1.702 m), weight 197 lb 8 oz (89.585 kg). Wt Readings from Last 3 Encounters:  06/22/14 197 lb 8 oz (89.585 kg)  04/02/14 195 lb 12.3 oz (88.8 kg)  03/23/14 195 lb (88.451 kg)     General: Well developed, well nourished, in no acute distress.  Head: Normocephalic, atraumatic, sclera non-icteric, mucus membranes are moist,   Neck: Supple. Carotids are 2 + without bruits. No JVD   Lungs: Clear   Heart:   Abdomen: Soft, non-tender, non-distended with normal bowel sounds.  Msk:  Strength and tone are normal   Extremities: No clubbing or cyanosis. No edema.  Distal pedal pulses are 2+ and equal    Neuro: CN II - XII intact.  Alert and oriented X 3.   Psych:  Normal   ECG: 03/23/2014: Normal sinus rhythm at 61. He is a previous inferior wall myocardial infarction. There are no acute  changes.  Assessment / Plan:

## 2014-06-22 NOTE — Patient Instructions (Addendum)
Your physician has recommended you make the following change in your medication:  START Fenofibrate 160 mg once daily  Your physician has requested that you have an echocardiogram. Echocardiography is a painless test that uses sound waves to create images of your heart. It provides your doctor with information about the size and shape of your heart and how well your heart's chambers and valves are working. This procedure takes approximately one hour. There are no restrictions for this procedure.  Your physician recommends that you return for lab work in: 3 months - 09/24/14 You will need to FAST for this appointment - nothing to eat or drink after midnight the night before except water.  Your physician wants you to follow-up in: 6 months with Dr. Elease Hashimoto.  You will receive a reminder letter in the mail two months in advance. If you don't receive a letter, please call our office to schedule the follow-up appointment.

## 2014-06-22 NOTE — Assessment & Plan Note (Signed)
Will check his lipids at his next office visit;

## 2014-06-22 NOTE — Assessment & Plan Note (Signed)
He has been stable .  No recent episodes of angina He has mildly reduced LV function from the MI. Will check an echo to reassess his LV function.

## 2014-06-24 ENCOUNTER — Encounter (HOSPITAL_COMMUNITY)
Admission: RE | Admit: 2014-06-24 | Discharge: 2014-06-24 | Disposition: A | Discharge status: Home / Self Care | Payer: BC Managed Care – PPO | Source: Ambulatory Visit | Attending: Cardiovascular Disease | Admitting: Cardiovascular Disease

## 2014-06-24 DIAGNOSIS — I213 ST elevation (STEMI) myocardial infarction of unspecified site: Secondary | ICD-10-CM | POA: Diagnosis not present

## 2014-06-25 ENCOUNTER — Ambulatory Visit (HOSPITAL_COMMUNITY): Payer: BC Managed Care – PPO | Attending: Cardiology | Admitting: Radiology

## 2014-06-25 ENCOUNTER — Other Ambulatory Visit (HOSPITAL_COMMUNITY): Payer: BC Managed Care – PPO

## 2014-06-25 DIAGNOSIS — E669 Obesity, unspecified: Secondary | ICD-10-CM | POA: Insufficient documentation

## 2014-06-25 DIAGNOSIS — Z87891 Personal history of nicotine dependence: Secondary | ICD-10-CM | POA: Diagnosis not present

## 2014-06-25 DIAGNOSIS — E785 Hyperlipidemia, unspecified: Secondary | ICD-10-CM | POA: Diagnosis not present

## 2014-06-25 DIAGNOSIS — I5021 Acute systolic (congestive) heart failure: Secondary | ICD-10-CM

## 2014-06-25 DIAGNOSIS — I213 ST elevation (STEMI) myocardial infarction of unspecified site: Secondary | ICD-10-CM | POA: Diagnosis not present

## 2014-06-25 DIAGNOSIS — I5022 Chronic systolic (congestive) heart failure: Secondary | ICD-10-CM

## 2014-06-25 NOTE — Progress Notes (Signed)
Echocardiogram performed.  

## 2014-06-26 ENCOUNTER — Encounter (HOSPITAL_COMMUNITY)
Admission: RE | Admit: 2014-06-26 | Discharge: 2014-06-26 | Disposition: A | Discharge status: Home / Self Care | Payer: BC Managed Care – PPO | Source: Ambulatory Visit | Attending: Cardiovascular Disease | Admitting: Cardiovascular Disease

## 2014-06-26 DIAGNOSIS — I213 ST elevation (STEMI) myocardial infarction of unspecified site: Secondary | ICD-10-CM | POA: Diagnosis not present

## 2014-06-29 ENCOUNTER — Encounter (HOSPITAL_COMMUNITY)
Admission: RE | Admit: 2014-06-29 | Discharge: 2014-06-29 | Disposition: A | Discharge status: Home / Self Care | Payer: BC Managed Care – PPO | Source: Ambulatory Visit | Attending: Cardiovascular Disease | Admitting: Cardiovascular Disease

## 2014-06-29 DIAGNOSIS — I213 ST elevation (STEMI) myocardial infarction of unspecified site: Secondary | ICD-10-CM | POA: Diagnosis not present

## 2014-07-01 ENCOUNTER — Encounter (HOSPITAL_COMMUNITY)
Admission: RE | Admit: 2014-07-01 | Discharge: 2014-07-01 | Disposition: A | Discharge status: Home / Self Care | Payer: BC Managed Care – PPO | Source: Ambulatory Visit | Attending: Cardiovascular Disease | Admitting: Cardiovascular Disease

## 2014-07-01 DIAGNOSIS — I213 ST elevation (STEMI) myocardial infarction of unspecified site: Secondary | ICD-10-CM | POA: Diagnosis not present

## 2014-07-03 ENCOUNTER — Encounter (HOSPITAL_COMMUNITY)
Admission: RE | Admit: 2014-07-03 | Discharge: 2014-07-03 | Disposition: A | Discharge status: Home / Self Care | Payer: BC Managed Care – PPO | Source: Ambulatory Visit | Attending: Cardiovascular Disease | Admitting: Cardiovascular Disease

## 2014-07-03 DIAGNOSIS — I213 ST elevation (STEMI) myocardial infarction of unspecified site: Secondary | ICD-10-CM | POA: Diagnosis not present

## 2014-07-06 ENCOUNTER — Encounter (HOSPITAL_COMMUNITY)
Admission: RE | Admit: 2014-07-06 | Discharge: 2014-07-06 | Disposition: A | Discharge status: Home / Self Care | Payer: BC Managed Care – PPO | Source: Ambulatory Visit | Attending: Cardiovascular Disease | Admitting: Cardiovascular Disease

## 2014-07-06 DIAGNOSIS — I213 ST elevation (STEMI) myocardial infarction of unspecified site: Secondary | ICD-10-CM | POA: Diagnosis not present

## 2014-07-08 ENCOUNTER — Encounter (HOSPITAL_COMMUNITY)
Admission: RE | Admit: 2014-07-08 | Discharge: 2014-07-08 | Disposition: A | Discharge status: Home / Self Care | Payer: BC Managed Care – PPO | Source: Ambulatory Visit | Attending: Cardiovascular Disease | Admitting: Cardiovascular Disease

## 2014-07-08 DIAGNOSIS — I213 ST elevation (STEMI) myocardial infarction of unspecified site: Secondary | ICD-10-CM | POA: Diagnosis not present

## 2014-07-09 DIAGNOSIS — G4700 Insomnia, unspecified: Secondary | ICD-10-CM | POA: Insufficient documentation

## 2014-07-09 DIAGNOSIS — R141 Gas pain: Secondary | ICD-10-CM | POA: Insufficient documentation

## 2014-07-09 DIAGNOSIS — L919 Hypertrophic disorder of the skin, unspecified: Secondary | ICD-10-CM | POA: Insufficient documentation

## 2014-07-09 DIAGNOSIS — I252 Old myocardial infarction: Secondary | ICD-10-CM | POA: Insufficient documentation

## 2014-07-09 DIAGNOSIS — J309 Allergic rhinitis, unspecified: Secondary | ICD-10-CM | POA: Insufficient documentation

## 2014-07-09 DIAGNOSIS — N401 Enlarged prostate with lower urinary tract symptoms: Secondary | ICD-10-CM | POA: Insufficient documentation

## 2014-07-09 DIAGNOSIS — I255 Ischemic cardiomyopathy: Secondary | ICD-10-CM | POA: Insufficient documentation

## 2014-07-10 ENCOUNTER — Encounter (HOSPITAL_COMMUNITY): Admission: RE | Admit: 2014-07-10 | Payer: BC Managed Care – PPO | Source: Ambulatory Visit

## 2014-07-13 ENCOUNTER — Encounter (HOSPITAL_COMMUNITY): Payer: BC Managed Care – PPO

## 2014-07-15 ENCOUNTER — Encounter (HOSPITAL_COMMUNITY)
Admission: RE | Admit: 2014-07-15 | Discharge: 2014-07-15 | Disposition: A | Discharge status: Home / Self Care | Payer: BC Managed Care – PPO | Source: Ambulatory Visit | Attending: Cardiovascular Disease | Admitting: Cardiovascular Disease

## 2014-07-15 DIAGNOSIS — I213 ST elevation (STEMI) myocardial infarction of unspecified site: Secondary | ICD-10-CM | POA: Diagnosis not present

## 2014-07-17 ENCOUNTER — Encounter (HOSPITAL_COMMUNITY): Payer: BC Managed Care – PPO

## 2014-07-20 ENCOUNTER — Encounter (HOSPITAL_COMMUNITY)
Admission: RE | Admit: 2014-07-20 | Discharge: 2014-07-20 | Disposition: A | Discharge status: Home / Self Care | Payer: BC Managed Care – PPO | Source: Ambulatory Visit | Attending: Cardiovascular Disease | Admitting: Cardiovascular Disease

## 2014-07-20 DIAGNOSIS — I213 ST elevation (STEMI) myocardial infarction of unspecified site: Secondary | ICD-10-CM | POA: Insufficient documentation

## 2014-07-20 DIAGNOSIS — Z955 Presence of coronary angioplasty implant and graft: Secondary | ICD-10-CM | POA: Insufficient documentation

## 2014-07-22 ENCOUNTER — Encounter (HOSPITAL_COMMUNITY)
Admission: RE | Admit: 2014-07-22 | Discharge: 2014-07-22 | Disposition: A | Discharge status: Home / Self Care | Payer: BC Managed Care – PPO | Source: Ambulatory Visit | Attending: Cardiovascular Disease | Admitting: Cardiovascular Disease

## 2014-07-22 DIAGNOSIS — I213 ST elevation (STEMI) myocardial infarction of unspecified site: Secondary | ICD-10-CM | POA: Diagnosis not present

## 2014-07-24 ENCOUNTER — Encounter (HOSPITAL_COMMUNITY): Payer: BC Managed Care – PPO

## 2014-07-24 ENCOUNTER — Encounter (HOSPITAL_COMMUNITY): Payer: Self-pay

## 2014-07-24 NOTE — Progress Notes (Signed)
Pt graduated from cardiac rehab program today with completion of 36 exercise sessions in Phase II. Pt maintained good attendance and progressed nicely during his participation in rehab as evidenced by increased MET level.   Medication list reconciled. Repeat  PHQ2 score-0  .  Pt has made significant lifestyle changes and should be commended for his success. Pt feels he has achieved his goals during cardiac rehab.   Pt plans to continue exercising on his own at local gym with his wife.

## 2014-07-27 ENCOUNTER — Encounter (HOSPITAL_COMMUNITY): Payer: BC Managed Care – PPO

## 2014-07-29 ENCOUNTER — Encounter (HOSPITAL_COMMUNITY): Payer: BC Managed Care – PPO

## 2014-07-31 ENCOUNTER — Encounter (HOSPITAL_COMMUNITY): Payer: BC Managed Care – PPO

## 2014-08-03 ENCOUNTER — Encounter (HOSPITAL_COMMUNITY): Payer: BC Managed Care – PPO

## 2014-08-05 ENCOUNTER — Encounter (HOSPITAL_COMMUNITY): Payer: BC Managed Care – PPO

## 2014-08-07 ENCOUNTER — Encounter (HOSPITAL_COMMUNITY): Payer: BC Managed Care – PPO

## 2014-08-27 ENCOUNTER — Encounter (HOSPITAL_COMMUNITY): Payer: Self-pay | Admitting: Cardiovascular Disease

## 2014-08-29 ENCOUNTER — Other Ambulatory Visit: Payer: Self-pay | Admitting: Cardiology

## 2014-09-08 ENCOUNTER — Other Ambulatory Visit: Payer: Self-pay | Admitting: Cardiology

## 2014-09-09 NOTE — Telephone Encounter (Signed)
Called [facility] and they verified that Brilinta was refilled for pt yesterday and has three remaining refills. Pharmacy will let pt know ready for pickup

## 2014-09-24 ENCOUNTER — Other Ambulatory Visit (INDEPENDENT_AMBULATORY_CARE_PROVIDER_SITE_OTHER): Payer: 59 | Admitting: *Deleted

## 2014-09-24 ENCOUNTER — Other Ambulatory Visit: Payer: BC Managed Care – PPO

## 2014-09-24 DIAGNOSIS — I5022 Chronic systolic (congestive) heart failure: Secondary | ICD-10-CM

## 2014-09-24 DIAGNOSIS — E785 Hyperlipidemia, unspecified: Secondary | ICD-10-CM

## 2014-09-24 LAB — LIPID PANEL
CHOL/HDL RATIO: 4
CHOLESTEROL: 129 mg/dL (ref 0–200)
HDL: 32.4 mg/dL — ABNORMAL LOW (ref >39.00)
LDL Cholesterol: 65 mg/dL (ref 0–99)
NonHDL: 96.6
Triglycerides: 160 mg/dL — ABNORMAL HIGH (ref 0.0–149.0)
VLDL: 32 mg/dL (ref 0.0–40.0)

## 2014-09-24 LAB — BASIC METABOLIC PANEL
BUN: 21 mg/dL (ref 6–23)
CALCIUM: 9.5 mg/dL (ref 8.4–10.5)
CHLORIDE: 102 meq/L (ref 96–112)
CO2: 27 mEq/L (ref 19–32)
CREATININE: 1.5 mg/dL (ref 0.4–1.5)
GFR: 51.72 mL/min — ABNORMAL LOW (ref >60.00)
GLUCOSE: 104 mg/dL — AB (ref 70–99)
Potassium: 4.2 mEq/L (ref 3.5–5.1)
Sodium: 135 mEq/L (ref 135–145)

## 2014-09-24 LAB — HEPATIC FUNCTION PANEL
ALT: 34 U/L (ref 0–53)
AST: 24 U/L (ref 0–37)
Albumin: 4.4 g/dL (ref 3.5–5.2)
Alkaline Phosphatase: 69 U/L (ref 39–117)
BILIRUBIN DIRECT: 0 mg/dL (ref 0.0–0.3)
TOTAL PROTEIN: 7.5 g/dL (ref 6.0–8.3)
Total Bilirubin: 0.5 mg/dL (ref 0.2–1.2)

## 2014-10-07 ENCOUNTER — Other Ambulatory Visit: Payer: Self-pay

## 2014-10-07 ENCOUNTER — Telehealth: Payer: Self-pay

## 2014-10-07 MED ORDER — TICAGRELOR 90 MG PO TABS: 90.0000 mg | ORAL_TABLET | Freq: Two times a day (BID) | ORAL | Status: DC

## 2014-10-07 NOTE — Telephone Encounter (Signed)
Brilinta PA reference # [id number] [facility] notified of approval and technician states it has been received

## 2014-12-14 ENCOUNTER — Other Ambulatory Visit: Payer: Self-pay

## 2014-12-14 ENCOUNTER — Telehealth: Payer: Self-pay | Admitting: Pulmonary Disease

## 2014-12-14 MED ORDER — PREDNISONE 20 MG PO TABS: ORAL_TABLET | ORAL | Status: DC

## 2014-12-14 MED ORDER — COLCHICINE 0.6 MG PO TABS: 0.6000 mg | ORAL_TABLET | Freq: Every day | ORAL | Status: DC

## 2014-12-14 NOTE — Telephone Encounter (Signed)
Spoke with pt. He is having a gout flare up. Would like a prescription for Prednisone and Colchicine sent in.  SN - please advise. Thanks.

## 2014-12-14 NOTE — Telephone Encounter (Signed)
Pt said his [facility] was not [facility] pt needs the colchicine was told was has to wait for approval and pt really needs it pt is there waiting  [phone] Verl Bangs

## 2014-12-14 NOTE — Telephone Encounter (Signed)
Looks like the pred was printed instead of being sent electronically  I have sent it electronically  Pt aware

## 2014-12-14 NOTE — Telephone Encounter (Signed)
Spoke with SN about gout flare. Per SN colchicine and prednisone taper to be called into pt pharmacy. Pt contacted and informed that RXs would be called in and to call back if not better. No further action needed at this time.

## 2015-01-05 ENCOUNTER — Encounter: Payer: Self-pay | Admitting: Cardiovascular Disease

## 2015-01-05 ENCOUNTER — Ambulatory Visit (INDEPENDENT_AMBULATORY_CARE_PROVIDER_SITE_OTHER): Payer: 59 | Admitting: Cardiovascular Disease

## 2015-01-05 VITALS — BP 106/80 | HR 67 | Ht 67.0 in | Wt 198.6 lb

## 2015-01-05 DIAGNOSIS — R002 Palpitations: Secondary | ICD-10-CM | POA: Diagnosis not present

## 2015-01-05 DIAGNOSIS — E785 Hyperlipidemia, unspecified: Secondary | ICD-10-CM

## 2015-01-05 DIAGNOSIS — I2119 ST elevation (STEMI) myocardial infarction involving other coronary artery of inferior wall: Secondary | ICD-10-CM | POA: Diagnosis not present

## 2015-01-05 DIAGNOSIS — I2510 Atherosclerotic heart disease of native coronary artery without angina pectoris: Secondary | ICD-10-CM | POA: Diagnosis not present

## 2015-01-05 NOTE — Patient Instructions (Signed)
Medication Instructions:  STOP Brilinta March 18, 2015  Labwork: Your physician recommends that you return for lab work (cholesterol, liver, basic metabolic panel) in: 1 year on the day of or a few days before your office visit with Dr. Elease Hashimoto.  You will need to FAST for this appointment - nothing to eat or drink after midnight the night before except water.    Testing/Procedures: None  Follow-Up: Your physician wants you to follow-up in: 1 year with Dr. Elease Hashimoto.  You will receive a reminder letter in the mail two months in advance. If you don't receive a letter, please call our office to schedule the follow-up appointment.

## 2015-01-05 NOTE — Progress Notes (Signed)
Cardiology Office Note   Date:  01/05/2015   ID:  Dwayne Larsen, DOB 22-Aug-1956, MRN [id number]  PCP:  Gwen Pounds, MD  Cardiologist:   Vesta Mixer, MD   Chief Complaint  Patient presents with  . Follow-up   1. CAD - stent March 07, 2014  3.0 x 18 mm Xience drug-eluting stent to the RCA . The stent was postdilated with a 3.25 noncompliant balloon. 2. Hyperlipidemia   History of Present Illness:  Nox is ding well.   Oct. 5, 2015:  Dwayne Larsen is doing well from a cardiac standpoint. He's not having any episodes of chest pain or shortness of breath. Recent labs show that his triglyceride level is a little elevated. In addition, his bilirubin level is slightly elevated. He still exercises on a regular basis   History of Present Illness: Dwayne Larsen is a 59 y.o. male who presents for CAD.  He has been having some belching and bloating sensatino.  No CP  Exercising regularly .    Past Medical History  Diagnosis Date  . Allergy, unspecified not elsewhere classified   . Palpitations   . Hypercholesteremia   . Abdominal pain, left lower quadrant   . Elevated prostate specific antigen (PSA)   . DJD (degenerative joint disease)   . Gout   . Lumbar back pain   . CAD (coronary artery disease) March 07, 2014    3.0 x 18 mm Xience drug-eluting stent. The stent was postdilated with a 3.25 noncompliant balloon.    Past Surgical History  Procedure Laterality Date  . Knee arthroscopy      right  . Laminectomy and microdiscectomy lumbar spine  2003    L4-5  . Rectal surgery  2003    Dr. Orson Slick  . Left heart catheterization with coronary angiogram N/A 03/07/2014    Procedure: LEFT HEART CATHETERIZATION WITH CORONARY ANGIOGRAM;  Surgeon: Iran Ouch, MD;  Location: [facility] CATH LAB;  Service: Cardiovascular;  Laterality: N/A;  . Percutaneous coronary stent intervention (pci-s)  03/07/2014    Procedure: PERCUTANEOUS CORONARY STENT INTERVENTION (PCI-S);  Surgeon: Iran Ouch, MD;  Location: [facility] CATH LAB;  Service: Cardiovascular;;     Current Outpatient Prescriptions  Medication Sig Dispense Refill  . aspirin 81 MG tablet Take 81 mg by mouth daily.      Marland Kitchen atorvastatin (LIPITOR) 80 MG tablet TAKE 1 TABLET BY MOUTH ONCE DAILY AT 6PM 30 tablet 5  . colchicine 0.6 MG tablet Take 0.6 mg by mouth as needed (for gout flareups).     . fenofibrate 160 MG tablet Take 1 tablet (160 mg total) by mouth daily. 31 tablet 11  . losartan (COZAAR) 25 MG tablet Take 1 tablet (25 mg total) by mouth daily. 90 tablet 3  . meloxicam (MOBIC) 15 MG tablet Take 15 mg by mouth daily as needed (for swelling or pain).     . metoprolol tartrate (LOPRESSOR) 25 MG tablet Take 0.5 tablets (12.5 mg total) by mouth 2 (two) times daily. 60 tablet 5  . nitroGLYCERIN (NITROSTAT) 0.4 MG SL tablet Place 1 tablet (0.4 mg total) under the tongue every 5 (five) minutes as needed for chest pain. 25 tablet 2  . oxymetazoline (AFRIN) 0.05 % nasal spray Place 1 spray into both nostrils daily.    . ticagrelor (BRILINTA) 90 MG TABS tablet Take 1 tablet (90 mg total) by mouth 2 (two) times daily. 180 tablet 3  . zolpidem (AMBIEN) 10 MG tablet Take  10 mg by mouth at bedtime as needed for sleep.     No current facility-administered medications for this visit.    Allergies:   Other and Penicillins    Social History:  The patient  reports that he quit smoking about 34 years ago. His smoking use included Cigarettes. He has a 10 pack-year smoking history. He has never used smokeless tobacco. He reports that he drinks alcohol. He reports that he does not use illicit drugs.   Family History:  The patient's family history includes Coronary artery disease in his father; Deep vein thrombosis in his father; Hyperlipidemia in his father.    ROS:  Please see the history of present illness.    Review of Systems: Constitutional:  denies fever, chills, diaphoresis, appetite change and fatigue.  HEENT: denies  photophobia, eye pain, redness, hearing loss, ear pain, congestion, sore throat, rhinorrhea, sneezing, neck pain, neck stiffness and tinnitus.  Respiratory: denies SOB, DOE, cough, chest tightness, and wheezing.  Cardiovascular: denies chest pain, palpitations and leg swelling.  Gastrointestinal: denies nausea, vomiting, abdominal pain, diarrhea, constipation, blood in stool.  Genitourinary: denies dysuria, urgency, frequency, hematuria, flank pain and difficulty urinating.  Musculoskeletal: denies  myalgias, back pain, joint swelling, arthralgias and gait problem.   Skin: denies pallor, rash and wound.  Neurological: denies dizziness, seizures, syncope, weakness, light-headedness, numbness and headaches.   Hematological: denies adenopathy, easy bruising, personal or family bleeding history.  Psychiatric/ Behavioral: denies suicidal ideation, mood changes, confusion, nervousness, sleep disturbance and agitation.       All other systems are reviewed and negative.    PHYSICAL EXAM: VS:  BP 106/80 mmHg  Pulse 67  Ht 5\' 7"  (1.702 m)  Wt 198 lb 9.6 oz (90.084 kg)  BMI 31.10 kg/m2 , BMI Body mass index is 31.1 kg/(m^2). GEN: Well nourished, well developed, in no acute distress HEENT: normal Neck: no JVD, carotid bruits, or masses Cardiac: RRR; no murmurs, rubs, or gallops,no edema  Respiratory:  clear to auscultation bilaterally, normal work of breathing GI: soft, nontender, nondistended, + BS MS: no deformity or atrophy Skin: warm and dry, no rash Neuro:  Strength and sensation are intact Psych: normal   EKG:  EKG is ordered today. The ekg ordered today demonstrates  NSR at 67.  Inf. MI. Old    Recent Labs: 01/15/2014: TSH 3.08 03/09/2014: Hemoglobin 13.0; Platelets 157 09/24/2014: ALT 34; BUN 21; Creatinine 1.5; Potassium 4.2; Sodium 135    Lipid Panel    Component Value Date/Time   CHOL 129 09/24/2014 0739   TRIG 160.0* 09/24/2014 0739   HDL 32.40* 09/24/2014 0739    CHOLHDL 4 09/24/2014 0739   VLDL 32.0 09/24/2014 0739   LDLCALC 65 09/24/2014 0739   LDLDIRECT 61.1 06/16/2014 0833      Wt Readings from Last 3 Encounters:  01/05/15 198 lb 9.6 oz (90.084 kg)  06/22/14 197 lb 8 oz (89.585 kg)  04/02/14 195 lb 12.3 oz (88.8 kg)      Other studies Reviewed: Additional studies/ records that were reviewed today include: . Review of the above records demonstrates:    ASSESSMENT AND PLAN:  1. CAD - stent March 07, 2014  3.0 x 18 mm Xience drug-eluting stent to the RCA . The stent was postdilated with a 3.25 noncompliant balloon.  He's doing very well. He's not having any episodes of chest pain.   I'll see him again in one year for follow-up visit with fasting labs.   2. Hyperlipidemia -  fasting labs in one year.   Current medicines are reviewed at length with the patient today.  The patient does not have concerns regarding medicines.  The following changes have been made:  He may DC his Brilinta at the end of June, 2016.  Continue ASA   Labs/ tests ordered today include:  No orders of the defined types were placed in this encounter.     Disposition:   FU with me in 1 year    Signed, Zeniah Briney, Deloris Ping, MD  01/05/2015 8:51 AM    [facility] [address] [place], [place], [place]  [address] Phone: [phone]; Fax: [phone]

## 2015-02-10 ENCOUNTER — Other Ambulatory Visit: Payer: Self-pay | Admitting: Cardiology

## 2015-02-26 ENCOUNTER — Other Ambulatory Visit: Payer: Self-pay | Admitting: Cardiovascular Disease

## 2015-03-15 DIAGNOSIS — M79672 Pain in left foot: Secondary | ICD-10-CM | POA: Insufficient documentation

## 2015-07-02 DIAGNOSIS — M7020 Olecranon bursitis, unspecified elbow: Secondary | ICD-10-CM | POA: Insufficient documentation

## 2015-07-05 ENCOUNTER — Other Ambulatory Visit: Payer: Self-pay | Admitting: Cardiovascular Disease

## 2015-07-05 DIAGNOSIS — I5022 Chronic systolic (congestive) heart failure: Secondary | ICD-10-CM

## 2015-07-05 MED ORDER — FENOFIBRATE 160 MG PO TABS: 160.0000 mg | ORAL_TABLET | Freq: Every day | ORAL | Status: DC

## 2015-07-13 ENCOUNTER — Other Ambulatory Visit: Payer: Self-pay | Admitting: *Deleted

## 2015-07-13 NOTE — Telephone Encounter (Signed)
called pt informed [facility] since he wants to use [facility].

## 2015-07-19 ENCOUNTER — Other Ambulatory Visit: Payer: Self-pay

## 2015-07-19 DIAGNOSIS — I5022 Chronic systolic (congestive) heart failure: Secondary | ICD-10-CM

## 2015-07-19 MED ORDER — FENOFIBRATE 160 MG PO TABS: 160.0000 mg | ORAL_TABLET | Freq: Every day | ORAL | Status: DC

## 2015-10-14 ENCOUNTER — Other Ambulatory Visit: Payer: Self-pay | Admitting: Cardiovascular Disease

## 2015-12-29 ENCOUNTER — Encounter: Payer: Self-pay | Admitting: Cardiology

## 2016-01-04 ENCOUNTER — Other Ambulatory Visit (INDEPENDENT_AMBULATORY_CARE_PROVIDER_SITE_OTHER): Payer: 59 | Admitting: *Deleted

## 2016-01-04 DIAGNOSIS — I2510 Atherosclerotic heart disease of native coronary artery without angina pectoris: Secondary | ICD-10-CM | POA: Diagnosis not present

## 2016-01-04 DIAGNOSIS — E785 Hyperlipidemia, unspecified: Secondary | ICD-10-CM | POA: Diagnosis not present

## 2016-01-04 LAB — BASIC METABOLIC PANEL
BUN: 20 mg/dL (ref 7–25)
CO2: 25 mmol/L (ref 20–31)
Calcium: 9.6 mg/dL (ref 8.6–10.3)
Chloride: 105 mmol/L (ref 98–110)
Creat: 1.32 mg/dL — ABNORMAL HIGH (ref 0.70–1.25)
Glucose, Bld: 114 mg/dL — ABNORMAL HIGH (ref 65–99)
POTASSIUM: 4.2 mmol/L (ref 3.5–5.3)
Sodium: 139 mmol/L (ref 135–146)

## 2016-01-04 LAB — LIPID PANEL
CHOLESTEROL: 108 mg/dL — AB (ref 125–200)
HDL: 34 mg/dL — ABNORMAL LOW (ref >=40)
LDL Cholesterol: 54 mg/dL (ref <130)
Total CHOL/HDL Ratio: 3.2 Ratio (ref <=5.0)
Triglycerides: 99 mg/dL (ref <150)
VLDL: 20 mg/dL (ref <30)

## 2016-01-04 LAB — HEPATIC FUNCTION PANEL
ALT: 22 U/L (ref 9–46)
AST: 20 U/L (ref 10–35)
Albumin: 4.2 g/dL (ref 3.6–5.1)
Alkaline Phosphatase: 63 U/L (ref 40–115)
BILIRUBIN INDIRECT: 0.4 mg/dL (ref 0.2–1.2)
Bilirubin, Direct: 0.1 mg/dL (ref <=0.2)
TOTAL PROTEIN: 6.5 g/dL (ref 6.1–8.1)
Total Bilirubin: 0.5 mg/dL (ref 0.2–1.2)

## 2016-01-05 ENCOUNTER — Other Ambulatory Visit: Payer: Self-pay

## 2016-01-05 ENCOUNTER — Encounter: Payer: Self-pay | Admitting: Cardiovascular Disease

## 2016-01-05 ENCOUNTER — Ambulatory Visit (INDEPENDENT_AMBULATORY_CARE_PROVIDER_SITE_OTHER): Payer: 59 | Admitting: Cardiovascular Disease

## 2016-01-05 VITALS — BP 108/70 | HR 64 | Ht 67.0 in | Wt 200.8 lb

## 2016-01-05 DIAGNOSIS — I2510 Atherosclerotic heart disease of native coronary artery without angina pectoris: Secondary | ICD-10-CM

## 2016-01-05 DIAGNOSIS — E785 Hyperlipidemia, unspecified: Secondary | ICD-10-CM | POA: Diagnosis not present

## 2016-01-05 NOTE — Patient Instructions (Signed)

## 2016-01-05 NOTE — Progress Notes (Signed)
Cardiology Office Note   Date:  01/05/2016   ID:  Dwayne Larsen, DOB [birth year], MRN [id number]  PCP:  Gwen Pounds, MD  Cardiologist:   Vesta Mixer, MD   Chief Complaint  Patient presents with  . Follow-up   1. CAD - stent March 07, 2014  3.0 x 18 mm Xience drug-eluting stent to the RCA . The stent was postdilated with a 3.25 noncompliant balloon. 2. Hyperlipidemia   History of Present Illness:  Dwayne Larsen is ding well.   Oct. 5, 2015:  Dwayne Larsen is doing well from a cardiac standpoint. He's not having any episodes of chest pain or shortness of breath. Recent labs show that his triglyceride level is a little elevated. In addition, his bilirubin level is slightly elevated. He still exercises on a regular basis   History of Present Illness: Dwayne Larsen is a 60 y.o. male who presents for CAD.  He has been having some belching and bloating sensatino.  No CP  Exercising regularly .   January 05, 2016: Doing well.  No CP ,  Staying active .     Past Medical History  Diagnosis Date  . Allergy, unspecified not elsewhere classified   . Palpitations   . Hypercholesteremia   . Abdominal pain, left lower quadrant   . Elevated prostate specific antigen (PSA)   . DJD (degenerative joint disease)   . Gout   . Lumbar back pain   . CAD (coronary artery disease) March 07, 2014    3.0 x 18 mm Xience drug-eluting stent. The stent was postdilated with a 3.25 noncompliant balloon.    Past Surgical History  Procedure Laterality Date  . Knee arthroscopy      right  . Laminectomy and microdiscectomy lumbar spine  2003    L4-5  . Rectal surgery  2003    Dr. Orson Slick  . Left heart catheterization with coronary angiogram N/A 03/07/2014    Procedure: LEFT HEART CATHETERIZATION WITH CORONARY ANGIOGRAM;  Surgeon: Iran Ouch, MD;  Location: [facility] CATH LAB;  Service: Cardiovascular;  Laterality: N/A;  . Percutaneous coronary stent intervention (pci-s)  03/07/2014    Procedure:  PERCUTANEOUS CORONARY STENT INTERVENTION (PCI-S);  Surgeon: Iran Ouch, MD;  Location: [facility] CATH LAB;  Service: Cardiovascular;;     Current Outpatient Prescriptions  Medication Sig Dispense Refill  . aspirin 81 MG tablet Take 81 mg by mouth daily.      Marland Kitchen atorvastatin (LIPITOR) 80 MG tablet TAKE 1 TABLET BY MOUTH ONCE DAILY AT 6PM 30 tablet 10  . colchicine 0.6 MG tablet Take 0.6 mg by mouth as needed (for gout flareups).     . fenofibrate 160 MG tablet TAKE 1 TABLET BY MOUTH EVERY DAY 30 tablet 2  . losartan (COZAAR) 25 MG tablet TAKE 1 TABLET BY MOUTH EVERY DAY 90 tablet 2  . meloxicam (MOBIC) 15 MG tablet Take 15 mg by mouth daily as needed (for swelling or pain).     . metoprolol tartrate (LOPRESSOR) 25 MG tablet TAKE 1/2 TABLET BY MOUTH TWICE A DAY 60 tablet 10  . nitroGLYCERIN (NITROSTAT) 0.4 MG SL tablet Place 1 tablet (0.4 mg total) under the tongue every 5 (five) minutes as needed for chest pain. 25 tablet 2  . oxymetazoline (AFRIN) 0.05 % nasal spray Place 1 spray into both nostrils daily.    . tamsulosin (FLOMAX) 0.4 MG CAPS capsule Take 0.4 mg by mouth daily.  6  . zolpidem (AMBIEN) 10 MG tablet  Take 10 mg by mouth at bedtime as needed for sleep.     No current facility-administered medications for this visit.    Allergies:   Other and Penicillins    Social History:  The patient  reports that he quit smoking about 35 years ago. His smoking use included Cigarettes. He has a 10 pack-year smoking history. He has never used smokeless tobacco. He reports that he drinks alcohol. He reports that he does not use illicit drugs.   Family History:  The patient's family history includes Coronary artery disease in his father; Deep vein thrombosis in his father; Hyperlipidemia in his father.    ROS:  Please see the history of present illness.    Review of Systems: Constitutional:  denies fever, chills, diaphoresis, appetite change and fatigue.  HEENT: denies photophobia, eye pain,  redness, hearing loss, ear pain, congestion, sore throat, rhinorrhea, sneezing, neck pain, neck stiffness and tinnitus.  Respiratory: denies SOB, DOE, cough, chest tightness, and wheezing.  Cardiovascular: denies chest pain, palpitations and leg swelling.  Gastrointestinal: denies nausea, vomiting, abdominal pain, diarrhea, constipation, blood in stool.  Genitourinary: denies dysuria, urgency, frequency, hematuria, flank pain and difficulty urinating.  Musculoskeletal: denies  myalgias, back pain, joint swelling, arthralgias and gait problem.   Skin: denies pallor, rash and wound.  Neurological: denies dizziness, seizures, syncope, weakness, light-headedness, numbness and headaches.   Hematological: denies adenopathy, easy bruising, personal or family bleeding history.  Psychiatric/ Behavioral: denies suicidal ideation, mood changes, confusion, nervousness, sleep disturbance and agitation.       All other systems are reviewed and negative.    PHYSICAL EXAM: VS:  BP 108/70 mmHg  Pulse 64  Ht  (1.702 m)  Wt 200 lb 12.8 oz (91.082 kg)  BMI 31.44 kg/m2 , BMI Body mass index is 31.44 kg/(m^2). GEN: Well nourished, well developed, in no acute distress HEENT: normal Neck: no JVD, carotid bruits, or masses Cardiac: RRR; no murmurs, rubs, or gallops,no edema  Respiratory:  clear to auscultation bilaterally, normal work of breathing GI: soft, nontender, nondistended, + BS MS: no deformity or atrophy Skin: warm and dry, no rash Neuro:  Strength and sensation are intact Psych: normal   EKG:  EKG is ordered today. The ekg ordered today demonstrates  NSR at 64.  Inf. MI. Old    Recent Labs: 01/04/2016: ALT 22; BUN 20; Creat 1.32*; Potassium 4.2; Sodium 139    Lipid Panel    Component Value Date/Time   CHOL 108* 01/04/2016 0740   TRIG 99 01/04/2016 0740   HDL 34* 01/04/2016 0740   CHOLHDL 3.2 01/04/2016 0740   VLDL 20 01/04/2016 0740   LDLCALC 54 01/04/2016 0740   LDLDIRECT  61.1 06/16/2014 0833      Wt Readings from Last 3 Encounters:  01/05/16 200 lb 12.8 oz (91.082 kg)  01/05/15 198 lb 9.6 oz (90.084 kg)  06/22/14 197 lb 8 oz (89.585 kg)      Other studies Reviewed: Additional studies/ records that were reviewed today include: . Review of the above records demonstrates:    ASSESSMENT AND PLAN:  1. CAD - stent March 07, 2014  3.0 x 18 mm Xience drug-eluting stent to the RCA . The stent was postdilated with a 3.25 noncompliant balloon.  He's doing very well. He's not having any episodes of chest pain.   I'll see him again in one year for follow-up visit with fasting labs.   2. Hyperlipidemia - labs are very good.   fasting labs  in one year.   Current medicines are reviewed at length with the patient today.  The patient does not have concerns regarding medicines.  The following changes have been made:  He may DC his Brilinta at the end of June, 2016.  Continue ASA   Labs/ tests ordered today include:  No orders of the defined types were placed in this encounter.     Disposition:   FU with me in 1 year    Signed, Nahser, Deloris Ping, MD  01/05/2016 9:52 AM    [facility] [address] [place], [place], [place]  [address] Phone: [phone]; Fax: [phone]

## 2016-01-11 ENCOUNTER — Other Ambulatory Visit: Payer: Self-pay | Admitting: Cardiovascular Disease

## 2016-01-28 ENCOUNTER — Other Ambulatory Visit: Payer: Self-pay

## 2016-01-28 MED ORDER — ATORVASTATIN CALCIUM 80 MG PO TABS: ORAL_TABLET | ORAL | Status: DC

## 2016-03-01 ENCOUNTER — Other Ambulatory Visit: Payer: Self-pay | Admitting: *Deleted

## 2016-03-01 MED ORDER — LOSARTAN POTASSIUM 25 MG PO TABS: 25.0000 mg | ORAL_TABLET | Freq: Every day | ORAL | Status: DC

## 2016-03-01 MED ORDER — METOPROLOL TARTRATE 25 MG PO TABS: 12.5000 mg | ORAL_TABLET | Freq: Two times a day (BID) | ORAL | Status: DC

## 2016-06-27 DIAGNOSIS — F419 Anxiety disorder, unspecified: Secondary | ICD-10-CM | POA: Insufficient documentation

## 2016-09-04 ENCOUNTER — Other Ambulatory Visit: Payer: Self-pay | Admitting: *Deleted

## 2016-09-04 ENCOUNTER — Other Ambulatory Visit: Payer: Self-pay

## 2016-09-04 ENCOUNTER — Other Ambulatory Visit: Payer: Self-pay | Admitting: Cardiovascular Disease

## 2016-09-04 MED ORDER — NITROGLYCERIN 0.4 MG SL SUBL: 0.4000 mg | SUBLINGUAL_TABLET | SUBLINGUAL | 1 refills | Status: DC | PRN

## 2016-09-04 MED ORDER — NITROGLYCERIN 0.4 MG SL SUBL: 0.4000 mg | SUBLINGUAL_TABLET | SUBLINGUAL | 5 refills | Status: DC | PRN

## 2016-09-05 ENCOUNTER — Other Ambulatory Visit: Payer: Self-pay | Admitting: Cardiovascular Disease

## 2016-09-05 ENCOUNTER — Other Ambulatory Visit: Payer: Self-pay | Admitting: *Deleted

## 2016-09-05 MED ORDER — NITROGLYCERIN 0.4 MG SL SUBL: 0.4000 mg | SUBLINGUAL_TABLET | SUBLINGUAL | 1 refills | Status: DC | PRN

## 2016-12-19 ENCOUNTER — Other Ambulatory Visit: Payer: Self-pay | Admitting: Cardiovascular Disease

## 2016-12-20 ENCOUNTER — Other Ambulatory Visit: Payer: Self-pay | Admitting: Cardiovascular Disease

## 2017-01-19 ENCOUNTER — Other Ambulatory Visit: Payer: Self-pay | Admitting: Cardiovascular Disease

## 2017-02-19 ENCOUNTER — Other Ambulatory Visit: Payer: Self-pay

## 2017-02-19 MED ORDER — ATORVASTATIN CALCIUM 80 MG PO TABS: ORAL_TABLET | ORAL | 1 refills | Status: DC

## 2017-02-23 ENCOUNTER — Other Ambulatory Visit: Payer: Self-pay | Admitting: Cardiovascular Disease

## 2017-02-26 ENCOUNTER — Other Ambulatory Visit: Payer: Self-pay | Admitting: Cardiovascular Disease

## 2017-03-19 ENCOUNTER — Other Ambulatory Visit: Payer: Self-pay | Admitting: Cardiovascular Disease

## 2017-03-20 ENCOUNTER — Other Ambulatory Visit: Payer: Self-pay | Admitting: Cardiovascular Disease

## 2017-04-18 ENCOUNTER — Other Ambulatory Visit: Payer: Self-pay | Admitting: Cardiovascular Disease

## 2017-04-27 ENCOUNTER — Other Ambulatory Visit: Payer: 59 | Admitting: *Deleted

## 2017-04-27 DIAGNOSIS — E7800 Pure hypercholesterolemia, unspecified: Secondary | ICD-10-CM

## 2017-04-27 DIAGNOSIS — I2510 Atherosclerotic heart disease of native coronary artery without angina pectoris: Secondary | ICD-10-CM

## 2017-04-27 DIAGNOSIS — E785 Hyperlipidemia, unspecified: Secondary | ICD-10-CM

## 2017-04-27 DIAGNOSIS — I5020 Unspecified systolic (congestive) heart failure: Secondary | ICD-10-CM

## 2017-04-27 DIAGNOSIS — R002 Palpitations: Secondary | ICD-10-CM

## 2017-04-27 LAB — COMPREHENSIVE METABOLIC PANEL
ALBUMIN: 4.4 g/dL (ref 3.6–4.8)
ALT: 24 IU/L (ref 0–44)
AST: 23 IU/L (ref 0–40)
Albumin/Globulin Ratio: 2.1 (ref 1.2–2.2)
Alkaline Phosphatase: 69 IU/L (ref 39–117)
BUN/Creatinine Ratio: 14 (ref 10–24)
BUN: 19 mg/dL (ref 8–27)
Bilirubin Total: 0.5 mg/dL (ref 0.0–1.2)
CO2: 23 mmol/L (ref 20–29)
CREATININE: 1.32 mg/dL — AB (ref 0.76–1.27)
Calcium: 9.2 mg/dL (ref 8.6–10.2)
Chloride: 104 mmol/L (ref 96–106)
GFR calc Af Amer: 67 mL/min/{1.73_m2} (ref >59)
GFR calc non Af Amer: 58 mL/min/{1.73_m2} — ABNORMAL LOW (ref >59)
GLUCOSE: 96 mg/dL (ref 65–99)
Globulin, Total: 2.1 g/dL (ref 1.5–4.5)
Potassium: 4.1 mmol/L (ref 3.5–5.2)
Sodium: 142 mmol/L (ref 134–144)
Total Protein: 6.5 g/dL (ref 6.0–8.5)

## 2017-04-27 LAB — LIPID PANEL
CHOL/HDL RATIO: 3.1 ratio (ref 0.0–5.0)
CHOLESTEROL TOTAL: 102 mg/dL (ref 100–199)
HDL: 33 mg/dL — AB (ref >39)
LDL CALC: 52 mg/dL (ref 0–99)
TRIGLYCERIDES: 85 mg/dL (ref 0–149)
VLDL CHOLESTEROL CAL: 17 mg/dL (ref 5–40)

## 2017-04-27 LAB — HEPATIC FUNCTION PANEL: Bilirubin, Direct: 0.16 mg/dL (ref 0.00–0.40)

## 2017-04-30 ENCOUNTER — Telehealth: Payer: Self-pay | Admitting: Cardiovascular Disease

## 2017-04-30 NOTE — Telephone Encounter (Signed)
Notes recorded by Vesta Mixer, MD on 04/30/2017 at 9:59 AM EDT Creatinine is a little elevated but is stable  Lipids are stable  Continue current meds  Patient aware of lab results.

## 2017-04-30 NOTE — Telephone Encounter (Signed)
New message ° ° ° °Pt is calling about his lab results.  °

## 2017-05-04 ENCOUNTER — Ambulatory Visit (INDEPENDENT_AMBULATORY_CARE_PROVIDER_SITE_OTHER): Payer: 59 | Admitting: Cardiovascular Disease

## 2017-05-04 ENCOUNTER — Encounter (INDEPENDENT_AMBULATORY_CARE_PROVIDER_SITE_OTHER): Payer: Self-pay

## 2017-05-04 ENCOUNTER — Encounter: Payer: Self-pay | Admitting: Cardiovascular Disease

## 2017-05-04 VITALS — BP 122/68 | HR 77 | Ht 67.0 in | Wt 203.0 lb

## 2017-05-04 DIAGNOSIS — I2510 Atherosclerotic heart disease of native coronary artery without angina pectoris: Secondary | ICD-10-CM | POA: Diagnosis not present

## 2017-05-04 DIAGNOSIS — E782 Mixed hyperlipidemia: Secondary | ICD-10-CM | POA: Diagnosis not present

## 2017-05-04 NOTE — Progress Notes (Signed)
Cardiology Office Note   Date:  05/04/2017   ID:  Dwayne Larsen, DOB 1955/10/28, MRN [id number]  PCP:  Creola Corn, MD  Cardiologist:   Kristeen Miss, MD   Chief Complaint  Patient presents with  . Coronary Artery Disease   1. CAD - stent March 07, 2014  3.0 x 18 mm Xience drug-eluting stent to the RCA . The stent was postdilated with a 3.25 noncompliant balloon. 2. Hyperlipidemia   History of Present Illness:  Dwayne Larsen is ding well.   Oct. 5, 2015:  Dwayne Larsen is doing well from a cardiac standpoint. He's not having any episodes of chest pain or shortness of breath. Recent labs show that his triglyceride level is a little elevated. In addition, his bilirubin level is slightly elevated. He still exercises on a regular basis   History of Present Illness: Dwayne Larsen is a 61 y.o. male who presents for CAD.  He has been having some belching and bloating sensatino.  No CP  Exercising regularly .   January 05, 2016: Doing well.  No CP ,  Staying active .   Aug. 17, 2018:  Dwayne Larsen is seen today for follow up visit Staying active..  Goes to the gym several times a week . No angina    Past Medical History:  Diagnosis Date  . Abdominal pain, left lower quadrant   . Allergy, unspecified not elsewhere classified   . CAD (coronary artery disease) March 07, 2014   3.0 x 18 mm Xience drug-eluting stent. The stent was postdilated with a 3.25 noncompliant balloon.  . DJD (degenerative joint disease)   . Elevated prostate specific antigen (PSA)   . Gout   . Hypercholesteremia   . Lumbar back pain   . Palpitations     Past Surgical History:  Procedure Laterality Date  . KNEE ARTHROSCOPY     right  . LAMINECTOMY AND MICRODISCECTOMY LUMBAR SPINE  2003   L4-5  . LEFT HEART CATHETERIZATION WITH CORONARY ANGIOGRAM N/A 03/07/2014   Procedure: LEFT HEART CATHETERIZATION WITH CORONARY ANGIOGRAM;  Surgeon: Iran Ouch, MD;  Location: [facility] CATH LAB;  Service: Cardiovascular;   Laterality: N/A;  . PERCUTANEOUS CORONARY STENT INTERVENTION (PCI-S)  03/07/2014   Procedure: PERCUTANEOUS CORONARY STENT INTERVENTION (PCI-S);  Surgeon: Iran Ouch, MD;  Location: [facility] CATH LAB;  Service: Cardiovascular;;  . RECTAL SURGERY  2003   Dr. Orson Slick     Current Outpatient Prescriptions  Medication Sig Dispense Refill  . aspirin 81 MG tablet Take 81 mg by mouth daily.      Marland Kitchen atorvastatin (LIPITOR) 80 MG tablet TAKE 1 TABLET BY MOUTH ONCE DAILY AT 6 PM 30 tablet 0  . colchicine 0.6 MG tablet Take 0.6 mg by mouth as needed (for gout flareups).     . fenofibrate 160 MG tablet TAKE 1 TABLET BY MOUTH EVERY DAY 90 tablet 0  . losartan (COZAAR) 25 MG tablet Take 1 tablet (25 mg total) by mouth daily. *Please keep 04/2017 appointment for further refills* 30 tablet 3  . meloxicam (MOBIC) 15 MG tablet Take 15 mg by mouth daily as needed (for swelling or pain).     . metoprolol tartrate (LOPRESSOR) 25 MG tablet TAKE 1/2 TABLET(12.5 MG) BY MOUTH TWICE DAILY 90 tablet 0  . nitroGLYCERIN (NITROSTAT) 0.4 MG SL tablet PLACE ONE TABLET UNDER THE TONGUE EVERY 5 MINUTES AS NEEDED FOR CHEST PAIN 25 tablet 1  . oxymetazoline (AFRIN) 0.05 % nasal spray Place 1 spray into  both nostrils daily.    . tamsulosin (FLOMAX) 0.4 MG CAPS capsule Take 0.4 mg by mouth daily.  6  . zolpidem (AMBIEN) 10 MG tablet Take 10 mg by mouth at bedtime as needed for sleep.     No current facility-administered medications for this visit.     Allergies:   Other and Penicillins    Social History:  The patient  reports that he quit smoking about 36 years ago. His smoking use included Cigarettes. He has a 10.00 pack-year smoking history. He has never used smokeless tobacco. He reports that he drinks alcohol. He reports that he does not use drugs.   Family History:  The patient's family history includes Coronary artery disease in his father; Deep vein thrombosis in his father; Hyperlipidemia in his father.    ROS:  Please  see the history of present illness.    Review of Systems: Constitutional:  denies fever, chills, diaphoresis, appetite change and fatigue.  HEENT: denies photophobia, eye pain, redness, hearing loss, ear pain, congestion, sore throat, rhinorrhea, sneezing, neck pain, neck stiffness and tinnitus.  Respiratory: denies SOB, DOE, cough, chest tightness, and wheezing.  Cardiovascular: denies chest pain, palpitations and leg swelling.  Gastrointestinal: denies nausea, vomiting, abdominal pain, diarrhea, constipation, blood in stool.  Genitourinary: denies dysuria, urgency, frequency, hematuria, flank pain and difficulty urinating.  Musculoskeletal: denies  myalgias, back pain, joint swelling, arthralgias and gait problem.   Skin: denies pallor, rash and wound.  Neurological: denies dizziness, seizures, syncope, weakness, light-headedness, numbness and headaches.   Hematological: denies adenopathy, easy bruising, personal or family bleeding history.  Psychiatric/ Behavioral: denies suicidal ideation, mood changes, confusion, nervousness, sleep disturbance and agitation.       All other systems are reviewed and negative.    PHYSICAL EXAM: VS:  BP 122/68   Pulse 77   Ht 5\' 7"  (1.702 m)   Wt 203 lb (92.1 kg)   SpO2 94%   BMI 31.79 kg/m  , BMI Body mass index is 31.79 kg/m. GEN: Well nourished, well developed, in no acute distress  HEENT: normal  Neck: no JVD, carotid bruits, or masses Cardiac: RRR; no murmurs, rubs, or gallops,no edema  Respiratory:  clear to auscultation bilaterally, normal work of breathing GI: soft, nontender, nondistended, + BS MS: no deformity or atrophy  Skin: warm and dry, no rash Neuro:  Strength and sensation are intact Psych: normal   EKG:  EKG is ordered today. The ekg ordered today demonstrates  NSR at 66.  Inf. MI. Old    Recent Labs: 04/27/2017: ALT 24; BUN 19; Creatinine, Ser 1.32; Potassium 4.1; Sodium 142    Lipid Panel    Component Value  Date/Time   CHOL 102 04/27/2017 0756   TRIG 85 04/27/2017 0756   HDL 33 (L) 04/27/2017 0756   CHOLHDL 3.1 04/27/2017 0756   CHOLHDL 3.2 01/04/2016 0740   VLDL 20 01/04/2016 0740   LDLCALC 52 04/27/2017 0756   LDLDIRECT 61.1 06/16/2014 0833      Wt Readings from Last 3 Encounters:  05/04/17 203 lb (92.1 kg)  01/05/16 200 lb 12.8 oz (91.1 kg)  01/05/15 198 lb 9.6 oz (90.1 kg)      Other studies Reviewed: Additional studies/ records that were reviewed today include: . Review of the above records demonstrates:    ASSESSMENT AND PLAN:  1. CAD - stent March 07, 2014  3.0 x 18 mm Xience drug-eluting stent to the RCA . The stent was postdilated with a 3.25  noncompliant balloon.  He's doing very well. He's not having any episodes of chest pain.   I'll see him again in one year for follow-up visit with fasting labs.   2. Hyperlipidemia - labs are very good.   fasting labs in one year.  Current medicines are reviewed at length with the patient today.  The patient does not have concerns regarding medicines.  The following changes have been made:  He may DC his Brilinta at the end of June, 2016.  Continue ASA   Labs/ tests ordered today include:  No orders of the defined types were placed in this encounter.    Disposition:   FU with me in 1 year for OV and fasting labs.    Signed, Kristeen Miss, MD  05/04/2017 3:29 PM    [facility] [address] [place], [place], [place]  [address] Phone: [phone]; Fax: [phone]

## 2017-05-04 NOTE — Patient Instructions (Signed)

## 2017-05-07 NOTE — Addendum Note (Signed)
Addended by: Vernard Gambles on: 05/07/2017 04:32 PM   Modules accepted: Orders

## 2017-05-11 ENCOUNTER — Other Ambulatory Visit: Payer: Self-pay | Admitting: Cardiovascular Disease

## 2017-05-15 ENCOUNTER — Other Ambulatory Visit: Payer: Self-pay | Admitting: Cardiovascular Disease

## 2017-05-28 ENCOUNTER — Other Ambulatory Visit: Payer: Self-pay | Admitting: Cardiovascular Disease

## 2017-09-02 ENCOUNTER — Other Ambulatory Visit: Payer: Self-pay | Admitting: Cardiovascular Disease

## 2017-09-03 ENCOUNTER — Other Ambulatory Visit: Payer: Self-pay | Admitting: Cardiovascular Disease

## 2017-11-08 DIAGNOSIS — H43811 Vitreous degeneration, right eye: Secondary | ICD-10-CM | POA: Diagnosis not present

## 2017-12-12 ENCOUNTER — Other Ambulatory Visit: Payer: Self-pay | Admitting: Cardiovascular Disease

## 2018-01-02 ENCOUNTER — Other Ambulatory Visit: Payer: Self-pay | Admitting: Cardiovascular Disease

## 2018-02-08 ENCOUNTER — Telehealth: Payer: Self-pay | Admitting: Cardiovascular Disease

## 2018-02-08 NOTE — Telephone Encounter (Signed)
Advised pt that lab work prior to his appt w/ Dr. Elease Hashimoto would be beneficial to the OV. Pt agreeable to stopping by office on 9/12 for fasting blood work.

## 2018-02-08 NOTE — Telephone Encounter (Signed)
New Message  Pt has an appt on 9/18 and wants to know if he needs labs prior. Please call

## 2018-02-26 ENCOUNTER — Other Ambulatory Visit: Payer: Self-pay | Admitting: Cardiovascular Disease

## 2018-03-28 ENCOUNTER — Other Ambulatory Visit: Payer: Self-pay | Admitting: Cardiovascular Disease

## 2018-05-15 ENCOUNTER — Encounter: Payer: Self-pay | Admitting: Cardiovascular Disease

## 2018-05-23 ENCOUNTER — Other Ambulatory Visit: Payer: Self-pay | Admitting: Cardiovascular Disease

## 2018-05-30 ENCOUNTER — Other Ambulatory Visit: Payer: Self-pay | Admitting: Cardiovascular Disease

## 2018-05-30 ENCOUNTER — Other Ambulatory Visit: Payer: BLUE CROSS/BLUE SHIELD | Admitting: *Deleted

## 2018-05-30 DIAGNOSIS — E782 Mixed hyperlipidemia: Secondary | ICD-10-CM

## 2018-05-30 LAB — HEPATIC FUNCTION PANEL
ALT: 30 IU/L (ref 0–44)
AST: 22 IU/L (ref 0–40)
Albumin: 4.4 g/dL (ref 3.6–4.8)
Alkaline Phosphatase: 69 IU/L (ref 39–117)
BILIRUBIN TOTAL: 0.5 mg/dL (ref 0.0–1.2)
Bilirubin, Direct: 0.16 mg/dL (ref 0.00–0.40)
Total Protein: 6.6 g/dL (ref 6.0–8.5)

## 2018-05-30 LAB — BASIC METABOLIC PANEL
BUN / CREAT RATIO: 14 (ref 10–24)
BUN: 21 mg/dL (ref 8–27)
CALCIUM: 10.5 mg/dL — AB (ref 8.6–10.2)
CHLORIDE: 102 mmol/L (ref 96–106)
CO2: 23 mmol/L (ref 20–29)
Creatinine, Ser: 1.5 mg/dL — ABNORMAL HIGH (ref 0.76–1.27)
GFR, EST AFRICAN AMERICAN: 57 mL/min/{1.73_m2} — AB (ref >59)
GFR, EST NON AFRICAN AMERICAN: 49 mL/min/{1.73_m2} — AB (ref >59)
Glucose: 121 mg/dL — ABNORMAL HIGH (ref 65–99)
POTASSIUM: 4.3 mmol/L (ref 3.5–5.2)
Sodium: 140 mmol/L (ref 134–144)

## 2018-06-05 ENCOUNTER — Ambulatory Visit (INDEPENDENT_AMBULATORY_CARE_PROVIDER_SITE_OTHER): Payer: BLUE CROSS/BLUE SHIELD | Admitting: Cardiovascular Disease

## 2018-06-05 ENCOUNTER — Encounter: Payer: Self-pay | Admitting: Cardiovascular Disease

## 2018-06-05 VITALS — BP 124/76 | HR 62 | Ht 67.0 in | Wt 203.0 lb

## 2018-06-05 DIAGNOSIS — E782 Mixed hyperlipidemia: Secondary | ICD-10-CM

## 2018-06-05 DIAGNOSIS — I2510 Atherosclerotic heart disease of native coronary artery without angina pectoris: Secondary | ICD-10-CM

## 2018-06-05 LAB — LIPID PANEL
Chol/HDL Ratio: 4.2 ratio (ref 0.0–5.0)
Cholesterol, Total: 133 mg/dL (ref 100–199)
HDL: 32 mg/dL — ABNORMAL LOW (ref >39)
LDL CALC: 65 mg/dL (ref 0–99)
Triglycerides: 181 mg/dL — ABNORMAL HIGH (ref 0–149)
VLDL CHOLESTEROL CAL: 36 mg/dL (ref 5–40)

## 2018-06-05 NOTE — Progress Notes (Signed)
Cardiology Office Note   Date:  06/05/2018   ID:  Dwayne Larsen, DOB 1956/04/26, MRN [id number]  PCP:  Creola Corn, MD  Cardiologist:   Kristeen Miss, MD   Chief Complaint  Patient presents with  . Coronary Artery Disease   1. CAD - stent March 07, 2014  3.0 x 18 mm Xience drug-eluting stent to the RCA . The stent was postdilated with a 3.25 noncompliant balloon. 2. Hyperlipidemia      Dwayne Larsen is doing well.   Oct. 5, 2015:  Dwayne Larsen is doing well from a cardiac standpoint. He's not having any episodes of chest pain or shortness of breath. Recent labs show that his triglyceride level is a little elevated. In addition, his bilirubin level is slightly elevated. He still exercises on a regular basis     Dwayne Larsen is a 62 y.o. male who presents for CAD.  He has been having some belching and bloating sensatino.  No CP  Exercising regularly .   January 05, 2016: Doing well.  No CP ,  Staying active .   Aug. 17, 2018:  Dwayne Larsen is seen today for follow up visit Staying active..  Goes to the gym several times a week . No angina   Sept. 18, 2019:  Doing well No CP or dyspnea Occasional palpitations Seems like an irregular HR .    Occasionally like a pause  Seems to resolve with a cough Occurs perhaps once a week    Not associated with sweats, shortness of breath or presyncope Exercising regularly ,   Walks, hikes quite a bit   Past Medical History:  Diagnosis Date  . Abdominal pain, left lower quadrant   . Allergy, unspecified not elsewhere classified   . CAD (coronary artery disease) March 07, 2014   3.0 x 18 mm Xience drug-eluting stent. The stent was postdilated with a 3.25 noncompliant balloon.  . DJD (degenerative joint disease)   . Elevated prostate specific antigen (PSA)   . Gout   . Hypercholesteremia   . Lumbar back pain   . Palpitations     Past Surgical History:  Procedure Laterality Date  . KNEE ARTHROSCOPY     right  . LAMINECTOMY AND  MICRODISCECTOMY LUMBAR SPINE  2003   L4-5  . LEFT HEART CATHETERIZATION WITH CORONARY ANGIOGRAM N/A 03/07/2014   Procedure: LEFT HEART CATHETERIZATION WITH CORONARY ANGIOGRAM;  Surgeon: Iran Ouch, MD;  Location: [facility] CATH LAB;  Service: Cardiovascular;  Laterality: N/A;  . PERCUTANEOUS CORONARY STENT INTERVENTION (PCI-S)  03/07/2014   Procedure: PERCUTANEOUS CORONARY STENT INTERVENTION (PCI-S);  Surgeon: Iran Ouch, MD;  Location: [facility] CATH LAB;  Service: Cardiovascular;;  . RECTAL SURGERY  2003   Dr. Orson Slick     Current Outpatient Medications  Medication Sig Dispense Refill  . aspirin 81 MG tablet Take 81 mg by mouth daily.      Marland Kitchen atorvastatin (LIPITOR) 80 MG tablet TAKE 1 TABLET BY MOUTH DAILY AT 6 PM 30 tablet 0  . colchicine 0.6 MG tablet Take 0.6 mg by mouth as needed (for gout flareups).     . fenofibrate 160 MG tablet TAKE 1 TABLET BY MOUTH ONCE DAILY 90 tablet 0  . losartan (COZAAR) 25 MG tablet Take 1 tablet (25 mg total) by mouth daily. 90 tablet 0  . meloxicam (MOBIC) 15 MG tablet Take 15 mg by mouth daily as needed (for swelling or pain).     . metoprolol tartrate (LOPRESSOR) 25 MG tablet  TAKE 1/2 TABLET BY MOUTH TWICE DAILY 90 tablet 0  . nitroGLYCERIN (NITROSTAT) 0.4 MG SL tablet PLACE ONE TABLET UNDER THE TONGUE EVERY 5 MINUTES AS NEEDED FOR CHEST PAIN 25 tablet 2  . oxymetazoline (AFRIN) 0.05 % nasal spray Place 1 spray into both nostrils daily.    . tamsulosin (FLOMAX) 0.4 MG CAPS capsule Take 0.4 mg by mouth daily.  6  . zolpidem (AMBIEN) 10 MG tablet Take 10 mg by mouth at bedtime as needed for sleep.     No current facility-administered medications for this visit.     Allergies:   Other and Penicillins    Social History:  The patient  reports that he quit smoking about 37 years ago. His smoking use included cigarettes. He has a 10.00 pack-year smoking history. He has never used smokeless tobacco. He reports that he drinks alcohol. He reports that he does not  use drugs.   Family History:  The patient's family history includes Coronary artery disease in his father; Deep vein thrombosis in his father; Hyperlipidemia in his father.    ROS:  Please see the history of present illness.      Physical Exam: Blood pressure 124/76, pulse 62, height 5\' 7"  (1.702 m), weight 203 lb (92.1 kg), SpO2 94 %.  GEN:  Well nourished, well developed in no acute distress HEENT: Normal NECK: No JVD; No carotid bruits LYMPHATICS: No lymphadenopathy CARDIAC: RRR, no murmurs, rubs, gallops RESPIRATORY:  Clear to auscultation without rales, wheezing or rhonchi  ABDOMEN: Soft, non-tender, non-distended MUSCULOSKELETAL:  No edema; No deformity  SKIN: Warm and dry NEUROLOGIC:  Alert and oriented x 3   EKG: June 05, 2018: Normal sinus rhythm at 62.  Previous inferior wall myocardial infarction.   Recent Labs: 05/30/2018: ALT 30; BUN 21; Creatinine, Ser 1.50; Potassium 4.3; Sodium 140    Lipid Panel    Component Value Date/Time   CHOL 102 04/27/2017 0756   TRIG 85 04/27/2017 0756   HDL 33 (L) 04/27/2017 0756   CHOLHDL 3.1 04/27/2017 0756   CHOLHDL 3.2 01/04/2016 0740   VLDL 20 01/04/2016 0740   LDLCALC 52 04/27/2017 0756   LDLDIRECT 61.1 06/16/2014 0833      Wt Readings from Last 3 Encounters:  06/05/18 203 lb (92.1 kg)  05/04/17 203 lb (92.1 kg)  01/05/16 200 lb 12.8 oz (91.1 kg)      Other studies Reviewed: Additional studies/ records that were reviewed today include: . Review of the above records demonstrates:    ASSESSMENT AND PLAN:  1. CAD - stent March 07, 2014  3.0 x 18 mm Xience drug-eluting stent to the RCA . The stent was postdilated with a 3.25 noncompliant balloon.     2. Hyperlipidemia -    3.  Palpitations:   Has rare episodes of palpitations.   These palpitations are not associated with any diaphoresis, presyncope, chest pain or shortness of breath.  I suspect that he might be having some premature ventricular  contractions.  His calcium levels are normal. Discussed placing a 30-day event monitor versus using a Kardia monitor by [employer].    At this point, he does not think the palpitations are severe enough to pursue given that he is asymptomatic.  He will call if they get worse   Labs/ tests ordered today include:  No orders of the defined types were placed in this encounter.    Disposition:   FU with me in 1 year for OV and fasting labs.  Signed, Kristeen Miss, MD  06/05/2018 9:28 AM    [facility] [address] [place], [place], [place]  [address] Phone: [phone]; Fax: [phone]

## 2018-06-05 NOTE — Patient Instructions (Signed)
Medication Instructions:  Your physician recommends that you continue on your current medications as directed. Please refer to the Current Medication list given to you today.   Labwork: TODAY - cholesterol   Testing/Procedures: None Ordered   Follow-Up: Your physician wants you to follow-up in: 1 year with Dr. Nahser.  You will receive a reminder letter in the mail two months in advance. If you don't receive a letter, please call our office to schedule the follow-up appointment.   If you need a refill on your cardiac medications before your next appointment, please call your pharmacy.   Thank you for choosing [facility]! Raneisha Bress, RN [phone]    

## 2018-06-26 ENCOUNTER — Other Ambulatory Visit: Payer: Self-pay | Admitting: Cardiovascular Disease

## 2018-06-27 ENCOUNTER — Other Ambulatory Visit: Payer: Self-pay | Admitting: Cardiovascular Disease

## 2018-07-15 DIAGNOSIS — E7849 Other hyperlipidemia: Secondary | ICD-10-CM | POA: Diagnosis not present

## 2018-07-15 DIAGNOSIS — R82998 Other abnormal findings in urine: Secondary | ICD-10-CM | POA: Diagnosis not present

## 2018-07-15 DIAGNOSIS — M109 Gout, unspecified: Secondary | ICD-10-CM | POA: Diagnosis not present

## 2018-07-15 DIAGNOSIS — Z125 Encounter for screening for malignant neoplasm of prostate: Secondary | ICD-10-CM | POA: Diagnosis not present

## 2018-07-22 DIAGNOSIS — E7849 Other hyperlipidemia: Secondary | ICD-10-CM | POA: Diagnosis not present

## 2018-07-22 DIAGNOSIS — F418 Other specified anxiety disorders: Secondary | ICD-10-CM | POA: Diagnosis not present

## 2018-07-22 DIAGNOSIS — R7309 Other abnormal glucose: Secondary | ICD-10-CM | POA: Diagnosis not present

## 2018-07-22 DIAGNOSIS — N1832 Chronic kidney disease, stage 3b: Secondary | ICD-10-CM | POA: Insufficient documentation

## 2018-07-22 DIAGNOSIS — Z0000 Encounter for general adult medical examination without abnormal findings: Secondary | ICD-10-CM | POA: Diagnosis not present

## 2018-07-22 DIAGNOSIS — Z1331 Encounter for screening for depression: Secondary | ICD-10-CM | POA: Diagnosis not present

## 2018-07-22 DIAGNOSIS — I2510 Atherosclerotic heart disease of native coronary artery without angina pectoris: Secondary | ICD-10-CM | POA: Diagnosis not present

## 2018-08-02 DIAGNOSIS — Z1212 Encounter for screening for malignant neoplasm of rectum: Secondary | ICD-10-CM | POA: Diagnosis not present

## 2018-08-25 ENCOUNTER — Other Ambulatory Visit: Payer: Self-pay | Admitting: Cardiovascular Disease

## 2018-11-11 DIAGNOSIS — H43813 Vitreous degeneration, bilateral: Secondary | ICD-10-CM | POA: Diagnosis not present

## 2018-11-22 ENCOUNTER — Other Ambulatory Visit: Payer: Self-pay | Admitting: Cardiovascular Disease

## 2019-03-09 ENCOUNTER — Other Ambulatory Visit: Payer: Self-pay | Admitting: Cardiovascular Disease

## 2019-05-14 DIAGNOSIS — Z20828 Contact with and (suspected) exposure to other viral communicable diseases: Secondary | ICD-10-CM | POA: Diagnosis not present

## 2019-05-22 ENCOUNTER — Other Ambulatory Visit: Payer: Self-pay

## 2019-05-22 ENCOUNTER — Other Ambulatory Visit: Payer: Self-pay | Admitting: Internal Medicine

## 2019-05-22 ENCOUNTER — Other Ambulatory Visit: Payer: Self-pay | Admitting: Cardiovascular Disease

## 2019-05-22 ENCOUNTER — Ambulatory Visit
Admission: RE | Admit: 2019-05-22 | Discharge: 2019-05-22 | Disposition: A | Discharge status: Home / Self Care | Payer: BC Managed Care – PPO | Source: Ambulatory Visit | Attending: Internal Medicine | Admitting: Internal Medicine

## 2019-05-22 ENCOUNTER — Inpatient Hospital Stay (HOSPITAL_COMMUNITY)
Admission: EM | Admit: 2019-05-22 | Discharge: 2019-05-24 | DRG: 699 | Disposition: A | Discharge status: Home / Self Care | Payer: BC Managed Care – PPO | Source: Ambulatory Visit | Attending: Internal Medicine | Admitting: Internal Medicine

## 2019-05-22 DIAGNOSIS — M1990 Unspecified osteoarthritis, unspecified site: Secondary | ICD-10-CM | POA: Diagnosis not present

## 2019-05-22 DIAGNOSIS — Z91018 Allergy to other foods: Secondary | ICD-10-CM

## 2019-05-22 DIAGNOSIS — R112 Nausea with vomiting, unspecified: Secondary | ICD-10-CM

## 2019-05-22 DIAGNOSIS — N390 Urinary tract infection, site not specified: Secondary | ICD-10-CM | POA: Diagnosis not present

## 2019-05-22 DIAGNOSIS — N139 Obstructive and reflux uropathy, unspecified: Secondary | ICD-10-CM | POA: Diagnosis not present

## 2019-05-22 DIAGNOSIS — R7989 Other specified abnormal findings of blood chemistry: Secondary | ICD-10-CM | POA: Diagnosis present

## 2019-05-22 DIAGNOSIS — N400 Enlarged prostate without lower urinary tract symptoms: Secondary | ICD-10-CM | POA: Diagnosis not present

## 2019-05-22 DIAGNOSIS — Z8349 Family history of other endocrine, nutritional and metabolic diseases: Secondary | ICD-10-CM | POA: Diagnosis not present

## 2019-05-22 DIAGNOSIS — Z87891 Personal history of nicotine dependence: Secondary | ICD-10-CM | POA: Diagnosis not present

## 2019-05-22 DIAGNOSIS — N210 Calculus in bladder: Secondary | ICD-10-CM | POA: Diagnosis not present

## 2019-05-22 DIAGNOSIS — R109 Unspecified abdominal pain: Secondary | ICD-10-CM | POA: Diagnosis not present

## 2019-05-22 DIAGNOSIS — K5732 Diverticulitis of large intestine without perforation or abscess without bleeding: Secondary | ICD-10-CM | POA: Diagnosis present

## 2019-05-22 DIAGNOSIS — K5792 Diverticulitis of intestine, part unspecified, without perforation or abscess without bleeding: Secondary | ICD-10-CM | POA: Diagnosis present

## 2019-05-22 DIAGNOSIS — I2510 Atherosclerotic heart disease of native coronary artery without angina pectoris: Secondary | ICD-10-CM | POA: Diagnosis present

## 2019-05-22 DIAGNOSIS — M109 Gout, unspecified: Secondary | ICD-10-CM | POA: Diagnosis not present

## 2019-05-22 DIAGNOSIS — N1330 Unspecified hydronephrosis: Secondary | ICD-10-CM | POA: Diagnosis not present

## 2019-05-22 DIAGNOSIS — Z791 Long term (current) use of non-steroidal anti-inflammatories (NSAID): Secondary | ICD-10-CM

## 2019-05-22 DIAGNOSIS — N136 Pyonephrosis: Secondary | ICD-10-CM | POA: Diagnosis not present

## 2019-05-22 DIAGNOSIS — N320 Bladder-neck obstruction: Secondary | ICD-10-CM | POA: Insufficient documentation

## 2019-05-22 DIAGNOSIS — Z7982 Long term (current) use of aspirin: Secondary | ICD-10-CM

## 2019-05-22 DIAGNOSIS — N183 Chronic kidney disease, stage 3 (moderate): Secondary | ICD-10-CM | POA: Diagnosis present

## 2019-05-22 DIAGNOSIS — Z880 Allergy status to penicillin: Secondary | ICD-10-CM

## 2019-05-22 DIAGNOSIS — E785 Hyperlipidemia, unspecified: Secondary | ICD-10-CM | POA: Diagnosis present

## 2019-05-22 DIAGNOSIS — Z8249 Family history of ischemic heart disease and other diseases of the circulatory system: Secondary | ICD-10-CM | POA: Diagnosis not present

## 2019-05-22 DIAGNOSIS — Z79899 Other long term (current) drug therapy: Secondary | ICD-10-CM | POA: Diagnosis not present

## 2019-05-22 DIAGNOSIS — Z20828 Contact with and (suspected) exposure to other viral communicable diseases: Secondary | ICD-10-CM | POA: Diagnosis not present

## 2019-05-22 DIAGNOSIS — N179 Acute kidney failure, unspecified: Secondary | ICD-10-CM | POA: Diagnosis not present

## 2019-05-22 DIAGNOSIS — E782 Mixed hyperlipidemia: Secondary | ICD-10-CM | POA: Diagnosis present

## 2019-05-22 LAB — COMPREHENSIVE METABOLIC PANEL
ALT: 20 U/L (ref 0–44)
AST: 18 U/L (ref 15–41)
Albumin: 4.1 g/dL (ref 3.5–5.0)
Alkaline Phosphatase: 48 U/L (ref 38–126)
Anion gap: 11 (ref 5–15)
BUN: 30 mg/dL — ABNORMAL HIGH (ref 8–23)
CO2: 22 mmol/L (ref 22–32)
Calcium: 9.7 mg/dL (ref 8.9–10.3)
Chloride: 104 mmol/L (ref 98–111)
Creatinine, Ser: 2.29 mg/dL — ABNORMAL HIGH (ref 0.61–1.24)
GFR calc Af Amer: 34 mL/min — ABNORMAL LOW (ref >60)
GFR calc non Af Amer: 29 mL/min — ABNORMAL LOW (ref >60)
Glucose, Bld: 96 mg/dL (ref 70–99)
Potassium: 3.8 mmol/L (ref 3.5–5.1)
Sodium: 137 mmol/L (ref 135–145)
Total Bilirubin: 1 mg/dL (ref 0.3–1.2)
Total Protein: 7 g/dL (ref 6.5–8.1)

## 2019-05-22 LAB — URINALYSIS, ROUTINE W REFLEX MICROSCOPIC
Bilirubin Urine: NEGATIVE
Glucose, UA: NEGATIVE mg/dL
Hgb urine dipstick: NEGATIVE
Ketones, ur: NEGATIVE mg/dL
Nitrite: POSITIVE — AB
Protein, ur: NEGATIVE mg/dL
Specific Gravity, Urine: 1.014 (ref 1.005–1.030)
WBC, UA: >50 WBC/hpf — ABNORMAL HIGH (ref 0–5)
pH: 7 (ref 5.0–8.0)

## 2019-05-22 LAB — CBC
HCT: 39.9 % (ref 39.0–52.0)
Hemoglobin: 13.1 g/dL (ref 13.0–17.0)
MCH: 27.5 pg (ref 26.0–34.0)
MCHC: 32.8 g/dL (ref 30.0–36.0)
MCV: 83.8 fL (ref 80.0–100.0)
Platelets: 175 10*3/uL (ref 150–400)
RBC: 4.76 MIL/uL (ref 4.22–5.81)
RDW: 13.7 % (ref 11.5–15.5)
WBC: 8.8 10*3/uL (ref 4.0–10.5)
nRBC: 0 % (ref 0.0–0.2)

## 2019-05-22 LAB — LIPASE, BLOOD: Lipase: 38 U/L (ref 11–51)

## 2019-05-22 LAB — LACTIC ACID, PLASMA: Lactic Acid, Venous: 1.1 mmol/L (ref 0.5–1.9)

## 2019-05-22 MED ORDER — SODIUM CHLORIDE 0.9% FLUSH: 3.0000 mL | Freq: Once | INTRAVENOUS | Status: DC

## 2019-05-22 MED ORDER — HYDROMORPHONE HCL 1 MG/ML IJ SOLN
1.0000 mg | Freq: Once | INTRAMUSCULAR | Status: AC
  Administered 2019-05-22: 22:00:00 1 mg via INTRAVENOUS
  Filled 2019-05-22: qty 1

## 2019-05-22 MED ORDER — ONDANSETRON HCL 4 MG/2ML IJ SOLN
4.0000 mg | Freq: Four times a day (QID) | INTRAMUSCULAR | Status: DC | PRN
  Administered 2019-05-23 (×3): 4 mg via INTRAVENOUS
  Filled 2019-05-22 (×3): qty 2

## 2019-05-22 MED ORDER — SODIUM CHLORIDE 0.9 % IV SOLN
1.0000 g | INTRAVENOUS | Status: DC
  Administered 2019-05-23: 1 g via INTRAVENOUS
  Filled 2019-05-22: qty 10

## 2019-05-22 MED ORDER — SODIUM CHLORIDE 0.9 % IV SOLN
INTRAVENOUS | Status: DC
  Administered 2019-05-23 (×2): via INTRAVENOUS

## 2019-05-22 MED ORDER — ENOXAPARIN SODIUM 40 MG/0.4ML ~~LOC~~ SOLN
40.0000 mg | SUBCUTANEOUS | Status: DC
  Administered 2019-05-23 – 2019-05-24 (×2): 40 mg via SUBCUTANEOUS
  Filled 2019-05-22 (×2): qty 0.4

## 2019-05-22 MED ORDER — ONDANSETRON HCL 4 MG/2ML IJ SOLN
4.0000 mg | Freq: Once | INTRAMUSCULAR | Status: AC
  Administered 2019-05-22: 4 mg via INTRAVENOUS
  Filled 2019-05-22: qty 2

## 2019-05-22 MED ORDER — SODIUM CHLORIDE 0.9 % IV SOLN
2.0000 g | INTRAVENOUS | Status: DC
  Administered 2019-05-22: 20:00:00 2 g via INTRAVENOUS
  Filled 2019-05-22: qty 20

## 2019-05-22 MED ORDER — ONDANSETRON HCL 4 MG PO TABS: 4.0000 mg | ORAL_TABLET | Freq: Four times a day (QID) | ORAL | Status: DC | PRN

## 2019-05-22 NOTE — ED Triage Notes (Signed)
Sent from dr's office, abnormal labs-- abd pain mmid abd-- started tues worse over past 2 days.

## 2019-05-22 NOTE — ED Triage Notes (Signed)
Also had CT at [facility] today-- labs drawn at dr's office-- they are faxing them

## 2019-05-22 NOTE — ED Provider Notes (Signed)
[facility] Provider Note   CSN: [id number] Arrival date & time: 05/22/19  1437     History   Chief Complaint Chief Complaint  Patient presents with   Abdominal Pain   sent from MD's office    HPI Dwayne Larsen is a 63 y.o. male.     Patient presents with c/o suprapubic discomfort for the past couple days, with general malaise/weakness, nausea and vomiting. Symptoms acute onset, dull, moderate, persistent, non-radiating, worse today. Saw Dr Ferd Hibbsusso's APP today, had CT and was sent to ED. No hx kidney stones. Patient feels as though he is able to empty bladder. No back or flank pain. No fever.   The history is provided by the patient and the spouse.  Abdominal Pain Associated symptoms: nausea and vomiting   Associated symptoms: no chest pain, no cough, no diarrhea, no fever, no shortness of breath and no sore throat     Past Medical History:  Diagnosis Date   Abdominal pain, left lower quadrant    Allergy, unspecified not elsewhere classified    CAD (coronary artery disease) March 07, 2014   3.0 x 18 mm Xience drug-eluting stent. The stent was postdilated with a 3.25 noncompliant balloon.   DJD (degenerative joint disease)    Elevated prostate specific antigen (PSA)    Gout    Hypercholesteremia    Lumbar back pain    Palpitations     Patient Active Problem List   Diagnosis Date Noted   Systolic CHF (HCC) 06/22/2014   CAD (coronary artery disease) 03/07/2014   BPH (benign prostatic hyperplasia) 07/01/2013   DISH (diffuse idiopathic skeletal hyperostosis) 04/21/2013   Hyperlipidemia 01/16/2013   Dyslipidemia 01/17/2012   ELEVATED PROSTATE SPECIFIC ANTIGEN 12/22/2009   ABDOMINAL PAIN, LEFT LOWER QUADRANT 11/30/2009   GOUT 12/22/2008   DEGENERATIVE JOINT DISEASE 12/19/2007   BACK PAIN, LUMBAR 12/19/2007   PALPITATIONS 12/19/2007   ALLERGY 11/15/2007    Past Surgical History:  Procedure Laterality Date     KNEE ARTHROSCOPY     right   LAMINECTOMY AND MICRODISCECTOMY LUMBAR SPINE  2003   L4-5   LEFT HEART CATHETERIZATION WITH CORONARY ANGIOGRAM N/A 03/07/2014   Procedure: LEFT HEART CATHETERIZATION WITH CORONARY ANGIOGRAM;  Surgeon: Iran OuchMuhammad A Arida, MD;  Location: [facility] CATH LAB;  Service: Cardiovascular;  Laterality: N/A;   PERCUTANEOUS CORONARY STENT INTERVENTION (PCI-S)  03/07/2014   Procedure: PERCUTANEOUS CORONARY STENT INTERVENTION (PCI-S);  Surgeon: Iran OuchMuhammad A Arida, MD;  [facility];  Service: Cardiovascular;;   RECTAL SURGERY  2003   Dr. Orson SlickBowman        Home Medications    Prior to Admission medications   Medication Sig Start Date End Date Taking? Authorizing Provider  aspirin 81 MG tablet Take 81 mg by mouth daily.      [provider]  atorvastatin (LIPITOR) 80 MG tablet TAKE 1 TABLET BY MOUTH DAILY AT 6 PM 05/31/18   Nahser, Deloris PingPhilip J, MD  atorvastatin (LIPITOR) 80 MG tablet TAKE 1 TABLET BY MOUTH DAILY AT 6 PM 03/10/19   Nahser, Deloris PingPhilip J, MD  colchicine 0.6 MG tablet Take 0.6 mg by mouth as needed (for gout flareups).     [provider]  fenofibrate 160 MG tablet TAKE 1 TABLET BY MOUTH ONCE DAILY 06/26/18   Nahser, Deloris PingPhilip J, MD  losartan (COZAAR) 25 MG tablet Take 1 tablet (25 mg total) by mouth daily. Please make overdue appt with Dr. Elease HashimotoNahser before anymore refills. 1st  attempt 05/22/19   Nahser, Deloris PingPhilip J, MD  meloxicam (MOBIC) 15 MG tablet Take 15 mg by mouth daily as needed (for swelling or pain).     [provider]  metoprolol tartrate (LOPRESSOR) 25 MG tablet Take 0.5 tablets (12.5 mg total) by mouth 2 (two) times daily. Please make overdue appt with Dr. Elease HashimotoNahser before anymore refills. 1st attempt 05/22/19   Nahser, Deloris PingPhilip J, MD  nitroGLYCERIN (NITROSTAT) 0.4 MG SL tablet PLACE ONE TABLET UNDER THE TONGUE EVERY 5 MINUTES AS NEEDED FOR CHEST PAIN 12/12/17   Nahser, Deloris PingPhilip J, MD  oxymetazoline (AFRIN) 0.05 % nasal spray Place 1 spray into both  nostrils daily.    [provider]  tamsulosin (FLOMAX) 0.4 MG CAPS capsule Take 0.4 mg by mouth daily. 12/03/15   [provider]  zolpidem (AMBIEN) 10 MG tablet Take 10 mg by mouth at bedtime as needed for sleep.    [provider]    Family History Family History  Problem Relation Age of Onset   Coronary artery disease Father    Hyperlipidemia Father    Deep vein thrombosis Father     Social History Social History   Tobacco Use   Smoking status: Former Smoker    Packs/day: 1.00    Years: 10.00    Pack years: 10.00    Types: Cigarettes    Quit date: 09/18/1980    Years since quitting: 38.6   Smokeless tobacco: Never Used   Tobacco comment: 1/2-1 ppd, ages [age]  Substance Use Topics   Alcohol use: Yes    Comment: social   Drug use: No     Allergies   Other and Penicillins   Review of Systems Review of Systems  Constitutional: Negative for fever.  HENT: Negative for sore throat.   Eyes: Negative for redness.  Respiratory: Negative for cough and shortness of breath.   Cardiovascular: Negative for chest pain and leg swelling.  Gastrointestinal: Positive for abdominal pain, nausea and vomiting. Negative for diarrhea.  Genitourinary: Negative for flank pain, scrotal swelling and testicular pain.  Musculoskeletal: Negative for back pain.  Skin: Negative for rash.  Neurological: Positive for weakness. Negative for headaches.  Hematological: Does not bruise/bleed easily.  Psychiatric/Behavioral: Negative for confusion.     Physical Exam Updated Vital Signs BP (!) 154/84    Pulse (!) 59    Temp 98.6 F (37 C) (Oral)    Resp 14    SpO2 99%   Physical Exam Vitals signs and nursing note reviewed.  Constitutional:      Appearance: Normal appearance. He is well-developed.  HENT:     Head: Atraumatic.     Nose: Nose normal.     Mouth/Throat:     Mouth: Mucous membranes are moist.     Pharynx: Oropharynx is clear.  Eyes:      General: No scleral icterus.    Conjunctiva/sclera: Conjunctivae normal.  Neck:     Musculoskeletal: Normal range of motion and neck supple. No neck rigidity.     Trachea: No tracheal deviation.  Cardiovascular:     Rate and Rhythm: Normal rate and regular rhythm.     Pulses: Normal pulses.     Heart sounds: Normal heart sounds. No murmur. No friction rub. No gallop.   Pulmonary:     Effort: Pulmonary effort is normal. No accessory muscle usage or respiratory distress.     Breath sounds: Normal breath sounds.  Abdominal:     General: Bowel sounds are normal.  There is no distension.     Palpations: Abdomen is soft. There is no mass.     Tenderness: There is no guarding.     Hernia: No hernia is present.     Comments: Mild suprapubic tenderness.   Genitourinary:    Comments: No cva tenderness. Musculoskeletal:        General: No swelling.  Skin:    General: Skin is warm and dry.     Findings: No rash.  Neurological:     Mental Status: He is alert.     Comments: Alert, speech clear.   Psychiatric:        Mood and Affect: Mood normal.      ED Treatments / Results  Labs (all labs ordered are listed, but only abnormal results are displayed) Results for orders placed or performed during the hospital encounter of 05/22/19  Lipase, blood  Result Value Ref Range   Lipase 38 11 - 51 U/L  Comprehensive metabolic panel  Result Value Ref Range   Sodium 137 135 - 145 mmol/L   Potassium 3.8 3.5 - 5.1 mmol/L   Chloride 104 98 - 111 mmol/L   CO2 22 22 - 32 mmol/L   Glucose, Bld 96 70 - 99 mg/dL   BUN 30 (H) 8 - 23 mg/dL   Creatinine, Ser 1.79 (H) 0.61 - 1.24 mg/dL   Calcium 9.7 8.9 - 15.0 mg/dL   Total Protein 7.0 6.5 - 8.1 g/dL   Albumin 4.1 3.5 - 5.0 g/dL   AST 18 15 - 41 U/L   ALT 20 0 - 44 U/L   Alkaline Phosphatase 48 38 - 126 U/L   Total Bilirubin 1.0 0.3 - 1.2 mg/dL   GFR calc non Af Amer 29 (L) >60 mL/min   GFR calc Af Amer 34 (L) >60 mL/min   Anion gap 11 5 - 15    CBC  Result Value Ref Range   WBC 8.8 4.0 - 10.5 K/uL   RBC 4.76 4.22 - 5.81 MIL/uL   Hemoglobin 13.1 13.0 - 17.0 g/dL   HCT 56.9 79.4 - 80.1 %   MCV 83.8 80.0 - 100.0 fL   MCH 27.5 26.0 - 34.0 pg   MCHC 32.8 30.0 - 36.0 g/dL   RDW 65.5 37.4 - 82.7 %   Platelets 175 150 - 400 K/uL   nRBC 0.0 0.0 - 0.2 %  Urinalysis, Routine w reflex microscopic  Result Value Ref Range   Color, Urine YELLOW YELLOW   APPearance HAZY (A) CLEAR   Specific Gravity, Urine 1.014 1.005 - 1.030   pH 7.0 5.0 - 8.0   Glucose, UA NEGATIVE NEGATIVE mg/dL   Hgb urine dipstick NEGATIVE NEGATIVE   Bilirubin Urine NEGATIVE NEGATIVE   Ketones, ur NEGATIVE NEGATIVE mg/dL   Protein, ur NEGATIVE NEGATIVE mg/dL   Nitrite POSITIVE (A) NEGATIVE   Leukocytes,Ua LARGE (A) NEGATIVE   WBC, UA >50 (H) 0 - 5 WBC/hpf   Bacteria, UA MANY (A) NONE SEEN   Mucus PRESENT    Budding Yeast PRESENT    Ct Abdomen Pelvis Wo Contrast  Result Date: 05/22/2019 CLINICAL DATA:  Acute right lower quadrant pain.  Loose stools. EXAM: CT ABDOMEN AND PELVIS WITHOUT CONTRAST TECHNIQUE: Multidetector CT imaging of the abdomen and pelvis was performed following the standard protocol without IV contrast. COMPARISON:  CT abdomen pelvis dated December 01, 2009. FINDINGS: Lower chest: No acute abnormality. Hepatobiliary: Hepatic steatosis. No focal liver abnormality. The gallbladder is contracted. No biliary  dilatation. Pancreas: Unremarkable. No pancreatic ductal dilatation or surrounding inflammatory changes. Spleen: Normal in size without focal abnormality. Adrenals/Urinary Tract: The adrenal glands are unremarkable. Punctate nonobstructive calculus in the right kidney. Moderate to severe bilateral hydroureteronephrosis. There are three 3-4 mm calculi in the bladder near the right UVJ. There are two additional calculi in the dependent midline bladder measuring up to 6 mm. Significant bladder distention with several diverticula. Stomach/Bowel: Extensive  sigmoid diverticulosis. Wall thickening of the mid sigmoid colon with surrounding inflammatory changes, consistent with acute diverticulitis. Small hiatal hernia. The stomach is otherwise within normal limits. No obstruction. Normal appendix. Vascular/Lymphatic: Aortic atherosclerosis. No enlarged abdominal or pelvic lymph nodes. Reproductive: Prostatomegaly with central gland hypertrophy. Other: No free fluid, fluid collection, or pneumoperitoneum. Musculoskeletal: No acute or significant osseous findings. IMPRESSION: 1. Acute diverticulitis of the mid sigmoid colon. No abscess or perforation. 2. Markedly distended bladder with moderate to severe bilateral hydroureteronephrosis secondary to urinary retention from bladder outlet obstruction. 3. Five calculi within the bladder, three of which are at or just beyond the right UVJ. 4. Hepatic steatosis. 5.  Aortic atherosclerosis (ICD10-I70.0). These results will be called to the ordering clinician or representative by the Radiologist Assistant, and communication documented in the PACS or zVision Dashboard. Electronically Signed   By: Obie DredgeWilliam T Derry M.D.   On: 05/22/2019 14:01    EKG EKG Interpretation  Date/Time:  Thursday May 22 2019 19:57:18 EDT Ventricular Rate:  67 PR Interval:    QRS Duration: 98 QT Interval:  423 QTC Calculation: 447 R Axis:   44 Text Interpretation:  Sinus rhythm Nonspecific T wave abnormality No significant change since last tracing Confirmed by Cathren LaineSteinl, Axle Parfait ([id number]) on 05/22/2019 8:25:46 PM   Radiology Ct Abdomen Pelvis Wo Contrast  Result Date: 05/22/2019 CLINICAL DATA:  Acute right lower quadrant pain.  Loose stools. EXAM: CT ABDOMEN AND PELVIS WITHOUT CONTRAST TECHNIQUE: Multidetector CT imaging of the abdomen and pelvis was performed following the standard protocol without IV contrast. COMPARISON:  CT abdomen pelvis dated December 01, 2009. FINDINGS: Lower chest: No acute abnormality. Hepatobiliary: Hepatic steatosis.  No focal liver abnormality. The gallbladder is contracted. No biliary dilatation. Pancreas: Unremarkable. No pancreatic ductal dilatation or surrounding inflammatory changes. Spleen: Normal in size without focal abnormality. Adrenals/Urinary Tract: The adrenal glands are unremarkable. Punctate nonobstructive calculus in the right kidney. Moderate to severe bilateral hydroureteronephrosis. There are three 3-4 mm calculi in the bladder near the right UVJ. There are two additional calculi in the dependent midline bladder measuring up to 6 mm. Significant bladder distention with several diverticula. Stomach/Bowel: Extensive sigmoid diverticulosis. Wall thickening of the mid sigmoid colon with surrounding inflammatory changes, consistent with acute diverticulitis. Small hiatal hernia. The stomach is otherwise within normal limits. No obstruction. Normal appendix. Vascular/Lymphatic: Aortic atherosclerosis. No enlarged abdominal or pelvic lymph nodes. Reproductive: Prostatomegaly with central gland hypertrophy. Other: No free fluid, fluid collection, or pneumoperitoneum. Musculoskeletal: No acute or significant osseous findings. IMPRESSION: 1. Acute diverticulitis of the mid sigmoid colon. No abscess or perforation. 2. Markedly distended bladder with moderate to severe bilateral hydroureteronephrosis secondary to urinary retention from bladder outlet obstruction. 3. Five calculi within the bladder, three of which are at or just beyond the right UVJ. 4. Hepatic steatosis. 5.  Aortic atherosclerosis (ICD10-I70.0). These results will be called to the ordering clinician or representative by the Radiologist Assistant, and communication documented in the PACS or zVision Dashboard. Electronically Signed   By: Obie DredgeWilliam T Derry M.D.   On: 05/22/2019 14:01  Procedures Procedures (including critical care time)  Medications Ordered in ED Medications  sodium chloride flush (NS) 0.9 % injection 3 mL (has no administration in  time range)  cefTRIAXone (ROCEPHIN) 2 g in sodium chloride 0.9 % 100 mL IVPB (2 g Intravenous New Bag/Given 05/22/19 2020)     Initial Impression / Assessment and Plan / ED Course  I have reviewed the triage vital signs and the nursing notes.  Pertinent labs & imaging results that were available during my care of the patient were reviewed by me and considered in my medical decision making (see chart for details).  Iv ns. Stat labs.   Reviewed nursing notes and prior charts for additional history.  Reviewed ct.   CT reviewed by me - obstructed uropathy w bladder stones.  Labs reviewed by me - uti. Rocephin iv. Urine culture.   Bladder scan shows > 999 ml in bladder. Large fr foley placed.   Urology consulted - discussed with Dr Junious Silk - states admit to medical service, tx uti, foley, leave foley in for 1 week and have see them in office then for f/u and voiding trial.   Medicine consulted for admission.    Final Clinical Impressions(s) / ED Diagnoses   Final diagnoses:  None    ED Discharge Orders    None       Lajean Saver, MD 05/22/19 2033

## 2019-05-22 NOTE — ED Notes (Addendum)
36F/37F requested from materials. Size not available on the floor.

## 2019-05-22 NOTE — ED Notes (Signed)
Pt sent c Acute Kidney Injury/ARF, Ab Pain and discomfort. CT C/W Nephrolithiasis and Obstructive Uropathy. The Diverticulitis may be an overcall? 1. Acute diverticulitis of the mid sigmoid colon. No abscess or perforation. 2. Markedly distended bladder with moderate to severe bilateral hydroureteronephrosis secondary to urinary retention from bladder outlet obstruction. 3. Five calculi within the bladder, three of which are at or just beyond the right UVJ. 4. Hepatic steatosis. 5. Aortic atherosclerosis (ICD10-I70.0).   This was sent from Dr. Virgina Jock by messaging- acuity changed and triage/sort nurse informed

## 2019-05-22 NOTE — H&P (Signed)
History and Physical   Dwayne Larsen XHB:[id number] DOB: 10-03-1955 DOA: 05/22/2019  Referring MD/NP/PA: Dr. Ashok Cordia   PCP: Shon Baton, MD   Outpatient Specialists: None  Patient coming from: Home  Chief Complaint: Abdominal pain and distention  HPI: Dwayne Larsen is a 63 y.o. male with medical history significant of gout, coronary artery disease, BPH, hyperlipidemia, degenerative joint disease who was sent over from his PCPs office with suprapubic pain general weakness nausea vomiting for a few days.  Patient had CT abdomen and pelvis done that showed significant hydronephrosis bilaterally and may be kidney stones.  He was seen and Foley catheter inserted.  Patient had immediate large volume of urine with hematuria.  He was also noted to have acute kidney injury as well as evidence of UTI.  Urology consulted with recommendations to leave Foley in place treat patient and have outpatient follow-up.  Patient denied any fever or chills no nausea vomiting or diarrhea.  ED Course: Temperature 97.3 blood pressure 150/87 pulse 59 respirate of 17 oxygen sat 97% room air.  Sodium 139 potassium 3.5 chloride 101 CO2 25 glucose 201 BUN 29 creatinine 2.36.  Calcium 9.3.  White count is 7.6 hemoglobin 12.8.  Platelets 184.  Urinalysis showed hazy urine with large leukocytes nitrite positive many bacteria and WBC more than 50.  CT abdomen pelvis showed acute diverticulitis of the mid sigmoid colon.  No abscess or perforation.  Also markedly distended bladder with moderate to severe bilateral hydro-retro-nephrosis secondary to urinary retention.-Calculi within the bladder 3 of which are at or just beyond the right UVJ.  Patient has Foley catheter inserted and is being admitted treatment.  Review of Systems: As per HPI otherwise 10 point review of systems negative.    Past Medical History:  Diagnosis Date  . Abdominal pain, left lower quadrant   . Allergy, unspecified not elsewhere classified   . CAD (coronary  artery disease) March 07, 2014   3.0 x 18 mm Xience drug-eluting stent. The stent was postdilated with a 3.25 noncompliant balloon.  . DJD (degenerative joint disease)   . Elevated prostate specific antigen (PSA)   . Gout   . Hypercholesteremia   . Lumbar back pain   . Palpitations     Past Surgical History:  Procedure Laterality Date  . KNEE ARTHROSCOPY     right  . LAMINECTOMY AND MICRODISCECTOMY LUMBAR SPINE  2003   L4-5  . LEFT HEART CATHETERIZATION WITH CORONARY ANGIOGRAM N/A 03/07/2014   Procedure: LEFT HEART CATHETERIZATION WITH CORONARY ANGIOGRAM;  Surgeon: Wellington Hampshire, MD;  Location: [facility];  Service: Cardiovascular;  Laterality: N/A;  . PERCUTANEOUS CORONARY STENT INTERVENTION (PCI-S)  03/07/2014   Procedure: PERCUTANEOUS CORONARY STENT INTERVENTION (PCI-S);  Surgeon: Wellington Hampshire, MD;  Location: [facility];  Service: Cardiovascular;;  . RECTAL SURGERY  2003   Dr. Deon Pilling     reports that he quit smoking about 38 years ago. His smoking use included cigarettes. He has a 10.00 pack-year smoking history. He has never used smokeless tobacco. He reports current alcohol use. He reports that he does not use drugs.  Allergies  Allergen Reactions  . Other Itching and Rash    Walnuts and pecans  . Penicillins Other (See Comments)    Did it involve swelling of the face/tongue/throat, SOB, or low BP?No Did it involve sudden or severe rash/hives, skin peeling, or any reaction on the inside of your mouth or nose? yes Did you need to seek medical  attention at a hospital or doctor's office? no When did it last happen?[age]yrs old If all above answers are "NO", may proceed with cephalosporin use.     Family History  Problem Relation Age of Onset  . Coronary artery disease Father   . Hyperlipidemia Father   . Deep vein thrombosis Father      Prior to Admission medications   Medication Sig Start Date End Date Taking? Authorizing Provider  aspirin 81 MG tablet  Take 81 mg by mouth daily.     Yes [provider]  atorvastatin (LIPITOR) 80 MG tablet TAKE 1 TABLET BY MOUTH DAILY AT 6 PM Patient taking differently: Take 80 mg by mouth every evening.  05/31/18  Yes Nahser, Deloris PingPhilip J, MD  fenofibrate 160 MG tablet TAKE 1 TABLET BY MOUTH ONCE DAILY Patient taking differently: Take 160 mg by mouth daily.  06/26/18  Yes Nahser, Deloris PingPhilip J, MD  meloxicam (MOBIC) 15 MG tablet Take 15 mg by mouth daily as needed (for swelling or pain).    Yes [provider]  metoprolol tartrate (LOPRESSOR) 25 MG tablet Take 0.5 tablets (12.5 mg total) by mouth 2 (two) times daily. Please make overdue appt with Dr. Elease HashimotoNahser before anymore refills. 1st attempt Patient taking differently: Take 12.5 mg by mouth 2 (two) times daily.  05/22/19  Yes Nahser, Deloris PingPhilip J, MD  naphazoline-pheniramine (NAPHCON-A) 0.025-0.3 % ophthalmic solution Place 1 drop into both eyes 4 (four) times daily as needed for eye irritation.   Yes [provider]  nitroGLYCERIN (NITROSTAT) 0.4 MG SL tablet PLACE ONE TABLET UNDER THE TONGUE EVERY 5 MINUTES AS NEEDED FOR CHEST PAIN Patient taking differently: Place 0.4 mg under the tongue every 5 (five) minutes as needed for chest pain.  12/12/17  Yes Nahser, Deloris PingPhilip J, MD  oxymetazoline (AFRIN) 0.05 % nasal spray Place 1 spray into both nostrils daily.   Yes [provider]  tamsulosin (FLOMAX) 0.4 MG CAPS capsule Take 0.4 mg by mouth daily. 12/03/15  Yes [provider]  zolpidem (AMBIEN) 10 MG tablet Take 10 mg by mouth at bedtime as needed for sleep.   Yes [provider]    Physical Exam: Vitals:   05/22/19 1956 05/22/19 1956 05/22/19 2030 05/22/19 2045  BP: (!) 154/84  (!) 155/85 (!) 141/90  Pulse: (!) 59  64 65  Resp: 14  14 16   Temp:      TempSrc:      SpO2: 99% 99% 100% 100%      Constitutional: NAD, calm, comfortable Vitals:   05/22/19 1956 05/22/19 1956 05/22/19 2030 05/22/19 2045  BP: (!) 154/84  (!)  155/85 (!) 141/90  Pulse: (!) 59  64 65  Resp: 14  14 16   Temp:      TempSrc:      SpO2: 99% 99% 100% 100%   Eyes: PERRL, lids and conjunctivae normal ENMT: Mucous membranes are moist. Posterior pharynx clear of any exudate or lesions.Normal dentition.  Neck: normal, supple, no masses, no thyromegaly Respiratory: clear to auscultation bilaterally, no wheezing, no crackles. Normal respiratory effort. No accessory muscle use.  Cardiovascular: Regular rate and rhythm, no murmurs / rubs / gallops. No extremity edema. 2+ pedal pulses. No carotid bruits.  Abdomen: Suprapubic tenderness with distended abdomen,, no masses palpated. No hepatosplenomegaly. Bowel sounds positive.  Musculoskeletal: no clubbing / cyanosis. No joint deformity upper and lower extremities. Good ROM, no contractures. Normal muscle tone.  Skin: no rashes, lesions, ulcers. No induration Neurologic: CN 2-12  grossly intact. Sensation intact, DTR normal. Strength 5/5 in all 4.  Psychiatric: Normal judgment and insight. Alert and oriented x 3. Normal mood.     Labs on Admission: I have personally reviewed following labs and imaging studies  CBC: Recent Labs  Lab 05/22/19 1524  WBC 8.8  HGB 13.1  HCT 39.9  MCV 83.8  PLT 175   Basic Metabolic Panel: Recent Labs  Lab 05/22/19 1524  NA 137  K 3.8  CL 104  CO2 22  GLUCOSE 96  BUN 30*  CREATININE 2.29*  CALCIUM 9.7   GFR: CrCl cannot be calculated (Unknown ideal weight.). Liver Function Tests: Recent Labs  Lab 05/22/19 1524  AST 18  ALT 20  ALKPHOS 48  BILITOT 1.0  PROT 7.0  ALBUMIN 4.1   Recent Labs  Lab 05/22/19 1524  LIPASE 38   No results for input(s): AMMONIA in the last 168 hours. Coagulation Profile: No results for input(s): INR, PROTIME in the last 168 hours. Cardiac Enzymes: No results for input(s): CKTOTAL, CKMB, CKMBINDEX, TROPONINI in the last 168 hours. BNP (last 3 results) No results for input(s): PROBNP in the last 8760 hours.  HbA1C: No results for input(s): HGBA1C in the last 72 hours. CBG: No results for input(s): GLUCAP in the last 168 hours. Lipid Profile: No results for input(s): CHOL, HDL, LDLCALC, TRIG, CHOLHDL, LDLDIRECT in the last 72 hours. Thyroid Function Tests: No results for input(s): TSH, T4TOTAL, FREET4, T3FREE, THYROIDAB in the last 72 hours. Anemia Panel: No results for input(s): VITAMINB12, FOLATE, FERRITIN, TIBC, IRON, RETICCTPCT in the last 72 hours. Urine analysis:    Component Value Date/Time   COLORURINE YELLOW 05/22/2019 1550   APPEARANCEUR HAZY (A) 05/22/2019 1550   LABSPEC 1.014 05/22/2019 1550   PHURINE 7.0 05/22/2019 1550   GLUCOSEU NEGATIVE 05/22/2019 1550   GLUCOSEU NEGATIVE 01/15/2014 0735   HGBUR NEGATIVE 05/22/2019 1550   BILIRUBINUR NEGATIVE 05/22/2019 1550   KETONESUR NEGATIVE 05/22/2019 1550   PROTEINUR NEGATIVE 05/22/2019 1550   UROBILINOGEN 0.2 01/15/2014 0735   NITRITE POSITIVE (A) 05/22/2019 1550   LEUKOCYTESUR LARGE (A) 05/22/2019 1550   Sepsis Labs: @LABRCNTIP (procalcitonin:4,lacticidven:4) )No results found for this or any previous visit (from the past 240 hour(s)).   Radiological Exams on Admission: Ct Abdomen Pelvis Wo Contrast  Result Date: 05/22/2019 CLINICAL DATA:  Acute right lower quadrant pain.  Loose stools. EXAM: CT ABDOMEN AND PELVIS WITHOUT CONTRAST TECHNIQUE: Multidetector CT imaging of the abdomen and pelvis was performed following the standard protocol without IV contrast. COMPARISON:  CT abdomen pelvis dated December 01, 2009. FINDINGS: Lower chest: No acute abnormality. Hepatobiliary: Hepatic steatosis. No focal liver abnormality. The gallbladder is contracted. No biliary dilatation. Pancreas: Unremarkable. No pancreatic ductal dilatation or surrounding inflammatory changes. Spleen: Normal in size without focal abnormality. Adrenals/Urinary Tract: The adrenal glands are unremarkable. Punctate nonobstructive calculus in the right kidney. Moderate to  severe bilateral hydroureteronephrosis. There are three 3-4 mm calculi in the bladder near the right UVJ. There are two additional calculi in the dependent midline bladder measuring up to 6 mm. Significant bladder distention with several diverticula. Stomach/Bowel: Extensive sigmoid diverticulosis. Wall thickening of the mid sigmoid colon with surrounding inflammatory changes, consistent with acute diverticulitis. Small hiatal hernia. The stomach is otherwise within normal limits. No obstruction. Normal appendix. Vascular/Lymphatic: Aortic atherosclerosis. No enlarged abdominal or pelvic lymph nodes. Reproductive: Prostatomegaly with central gland hypertrophy. Other: No free fluid, fluid collection, or pneumoperitoneum. Musculoskeletal: No acute or significant osseous findings. IMPRESSION: 1. Acute  diverticulitis of the mid sigmoid colon. No abscess or perforation. 2. Markedly distended bladder with moderate to severe bilateral hydroureteronephrosis secondary to urinary retention from bladder outlet obstruction. 3. Five calculi within the bladder, three of which are at or just beyond the right UVJ. 4. Hepatic steatosis. 5.  Aortic atherosclerosis (ICD10-I70.0). These results will be called to the ordering clinician or representative by the Radiologist Assistant, and communication documented in the PACS or zVision Dashboard. Electronically Signed   By: Obie DredgeWilliam T Derry M.D.   On: 05/22/2019 14:01    EKG: Independently reviewed.  It shows sinus rhythm with a rate of 67, nonspecific ST changes  Assessment/Plan Principal Problem:   Acute bilateral obstructive uropathy Active Problems:   Dyslipidemia   Hyperlipidemia   BPH (benign prostatic hyperplasia)   CAD (coronary artery disease)   ARF (acute renal failure) (HCC)   Acute diverticulitis   UTI (urinary tract infection)     #1 bilateral obstructive uropathy: Most likely secondary to nephrolithiasis.  We will leave Foley catheter in place.  Patient is  now having urine draining.  Urology to follow-up with the patient in the outpatient setting within a week.  #2 acute kidney injury: Most likely secondary to obstructive uropathy.  We will recheck a renal function now that this is relieved.  #3 remote history of BPH: Not on any medications.  Defer to urology.  May initiate Flomax before discharge.  #4 hyperlipidemia: Continue with home regimen.  #5 coronary artery disease: Stable.  Continue monitoring.  #6 acute diverticulitis: On CT scan.  Antibiotics started.  Patient is having no significant tenderness.   DVT prophylaxis: Lovenox Code Status: Full code Family Communication: Wife at bedside Disposition Plan: Home Consults called: Urology Admission status: Inpatient  Severity of Illness: The appropriate patient status for this patient is INPATIENT. Inpatient status is judged to be reasonable and necessary in order to provide the required intensity of service to ensure the patient's safety. The patient's presenting symptoms, physical exam findings, and initial radiographic and laboratory data in the context of their chronic comorbidities is felt to place them at high risk for further clinical deterioration. Furthermore, it is not anticipated that the patient will be medically stable for discharge from the hospital within 2 midnights of admission. The following factors support the patient status of inpatient.   " The patient's presenting symptoms include abdominal pain. " The worrisome physical exam findings include suprapubic tenderness. " The initial radiographic and laboratory data are worrisome because of CT findings of hydronephrosis. " The chronic co-morbidities include coronary artery disease.   * I certify that at the point of admission it is my clinical judgment that the patient will require inpatient hospital care spanning beyond 2 midnights from the point of admission due to high intensity of service, high risk for further  deterioration and high frequency of surveillance required.Lonia Blood*    Gedalia Mcmillon,LAWAL MD [facility] Pager [phone]  If 7PM-7AM, please contact night-coverage [email or web address] Password [email or web address]  05/22/2019, 9:05 PM

## 2019-05-22 NOTE — ED Notes (Signed)
Dr. Jonelle Sidle at bedside. Updated him on pt passing small blood clot in foley.

## 2019-05-23 ENCOUNTER — Encounter (HOSPITAL_COMMUNITY): Payer: Self-pay

## 2019-05-23 DIAGNOSIS — N179 Acute kidney failure, unspecified: Secondary | ICD-10-CM

## 2019-05-23 DIAGNOSIS — K5792 Diverticulitis of intestine, part unspecified, without perforation or abscess without bleeding: Secondary | ICD-10-CM

## 2019-05-23 DIAGNOSIS — N390 Urinary tract infection, site not specified: Secondary | ICD-10-CM

## 2019-05-23 DIAGNOSIS — I2510 Atherosclerotic heart disease of native coronary artery without angina pectoris: Secondary | ICD-10-CM

## 2019-05-23 LAB — COMPREHENSIVE METABOLIC PANEL
ALT: 19 U/L (ref 0–44)
AST: 18 U/L (ref 15–41)
Albumin: 3.8 g/dL (ref 3.5–5.0)
Alkaline Phosphatase: 46 U/L (ref 38–126)
Anion gap: 13 (ref 5–15)
BUN: 29 mg/dL — ABNORMAL HIGH (ref 8–23)
CO2: 25 mmol/L (ref 22–32)
Calcium: 9.3 mg/dL (ref 8.9–10.3)
Chloride: 101 mmol/L (ref 98–111)
Creatinine, Ser: 2.36 mg/dL — ABNORMAL HIGH (ref 0.61–1.24)
GFR calc Af Amer: 33 mL/min — ABNORMAL LOW (ref >60)
GFR calc non Af Amer: 28 mL/min — ABNORMAL LOW (ref >60)
Glucose, Bld: 201 mg/dL — ABNORMAL HIGH (ref 70–99)
Potassium: 3.5 mmol/L (ref 3.5–5.1)
Sodium: 139 mmol/L (ref 135–145)
Total Bilirubin: 0.6 mg/dL (ref 0.3–1.2)
Total Protein: 6.8 g/dL (ref 6.5–8.1)

## 2019-05-23 LAB — URINE CULTURE

## 2019-05-23 LAB — LACTIC ACID, PLASMA: Lactic Acid, Venous: 1.4 mmol/L (ref 0.5–1.9)

## 2019-05-23 LAB — CBC
HCT: 39.1 % (ref 39.0–52.0)
Hemoglobin: 12.8 g/dL — ABNORMAL LOW (ref 13.0–17.0)
MCH: 27.4 pg (ref 26.0–34.0)
MCHC: 32.7 g/dL (ref 30.0–36.0)
MCV: 83.7 fL (ref 80.0–100.0)
Platelets: 184 10*3/uL (ref 150–400)
RBC: 4.67 MIL/uL (ref 4.22–5.81)
RDW: 13.9 % (ref 11.5–15.5)
WBC: 7.6 10*3/uL (ref 4.0–10.5)
nRBC: 0 % (ref 0.0–0.2)

## 2019-05-23 LAB — HIV ANTIBODY (ROUTINE TESTING W REFLEX): HIV Screen 4th Generation wRfx: NONREACTIVE

## 2019-05-23 LAB — SARS CORONAVIRUS 2 (TAT 6-24 HRS): SARS Coronavirus 2: NEGATIVE

## 2019-05-23 MED ORDER — SENNOSIDES-DOCUSATE SODIUM 8.6-50 MG PO TABS
1.0000 | ORAL_TABLET | Freq: Every evening | ORAL | Status: DC | PRN
  Administered 2019-05-23: 2 via ORAL
  Filled 2019-05-23: qty 2

## 2019-05-23 MED ORDER — MORPHINE SULFATE (PF) 2 MG/ML IV SOLN
1.0000 mg | INTRAVENOUS | Status: DC | PRN
  Administered 2019-05-23 (×2): 2 mg via INTRAVENOUS
  Filled 2019-05-23 (×2): qty 1

## 2019-05-23 MED ORDER — MORPHINE SULFATE (PF) 2 MG/ML IV SOLN
1.0000 mg | INTRAVENOUS | Status: DC | PRN
  Administered 2019-05-23: 01:00:00 2 mg via INTRAVENOUS
  Filled 2019-05-23: qty 1

## 2019-05-23 NOTE — Progress Notes (Signed)
Received from er a/ox4- still in considerable amt of discomfort was preferring to stand when he laid down it increased obtained prn morphine orders- and also administered a dose of zofran he was concerned about becoming sick to his stomach- foley draining pink urine

## 2019-05-23 NOTE — Progress Notes (Signed)
PROGRESS NOTE  NOTNAMED SCHOLZ TMH:[id number] DOB: [birth year] DOA: 05/22/2019 PCP: Shon Baton, MD   LOS: 1 day   Brief Narrative / Interim history: 63 year old male with gout, CAD, BPH, hyperlipidemia, DJD who was sent down from his PCPs office with suprapubic pain, generalized weakness, nausea and vomiting for a few days.  She had a CT scan of the abdomen and pelvis which showed significant hydronephrosis bilaterally, and bladder calculi.  He was also found to have acute diverticulitis of the mid sigmoid colon without abscess or perforation.  Urinalysis indicated a UTI.  He had a Foley catheter placed, was started on antibiotics, and urology was consulted by ED.  Subjective: Feeling better this morning, has less abdominal pain, no significant nausea.  Assessment & Plan: Principal Problem:   Acute bilateral obstructive uropathy Active Problems:   Dyslipidemia   Hyperlipidemia   BPH (benign prostatic hyperplasia)   CAD (coronary artery disease)   ARF (acute renal failure) (HCC)   Acute diverticulitis   UTI (urinary tract infection)   Principal Problem Acute kidney injury due to bilateral obstructive uropathy on underlying chronic kidney disease probable stage III -Possibly secondary to nephrolithiasis, he had a Foley catheter placed.  EDP discussed case with Dr. Junious Silk who recommended outpatient follow-up in a week -He continues to have elevated creatinine this morning to 2.36.  He does have a degree of chronically elevated creatinine around 1.3 back in 2017-18 and 1.5 in 2019.  Continue IV fluids and monitor urine output  Active Problems History of BPH -To follow-up with urology next week  CAD -No chest pain, stable, continue monitoring  Acute diverticulitis -No significant symptoms related to that, no abdominal tenderness.  Already on antibiotics  UTI -Cultures pending, continue ceftriaxone   Scheduled Meds:  enoxaparin (LOVENOX) injection  40 mg Subcutaneous Q24H    sodium chloride flush  3 mL Intravenous Once   Continuous Infusions:  sodium chloride 100 mL/hr at 05/23/19 1215   cefTRIAXone (ROCEPHIN)  IV     PRN Meds:.morphine injection, ondansetron **OR** ondansetron (ZOFRAN) IV  DVT prophylaxis: Lovenox Code Status: Full code Family Communication: d/w patient  Disposition Plan: home once Cr trending down   Consultants:   None   Procedures:   None   Antimicrobials:  Ceftriaxone 9/3 >>    Objective: Vitals:   05/22/19 2153 05/22/19 2353 05/23/19 0405 05/23/19 0845  BP: 127/69 140/72 (!) 146/100 (!) 161/97  Pulse: 62 66 64 62  Resp: 11 14 18 18   Temp:  (!) 97.3 F (36.3 C) 98 F (36.7 C) 98.4 F (36.9 C)  TempSrc: Oral Oral Oral Oral  SpO2: 99% 99% 100% 100%    Intake/Output Summary (Last 24 hours) at 05/23/2019 1251 Last data filed at 05/23/2019 1032 Gross per 24 hour  Intake 1235.37 ml  Output 3000 ml  Net -1764.63 ml   There were no vitals filed for this visit.  Examination:  Constitutional: NAD Eyes: PERRL, lids and conjunctivae normal ENMT: Mucous membranes are moist.  Respiratory: clear to auscultation bilaterally, no wheezing, no crackles. Cardiovascular: Regular rate and rhythm, no murmurs / rubs / gallops. No LE edema.  Abdomen: no tenderness. Bowel sounds positive.  Musculoskeletal: no clubbing / cyanosis.  Skin: no rashes Neurologic: CN 2-12 grossly intact. Strength 5/5 in all 4.  Psychiatric: Normal judgment and insight. Alert and oriented x 3. Normal mood.   Data Reviewed: I have independently reviewed following labs and imaging studies   CBC: Recent Labs  Lab 05/22/19 1524  05/23/19 0006  WBC 8.8 7.6  HGB 13.1 12.8*  HCT 39.9 39.1  MCV 83.8 83.7  PLT 175 184   Basic Metabolic Panel: Recent Labs  Lab 05/22/19 1524 05/23/19 0006  NA 137 139  K 3.8 3.5  CL 104 101  CO2 22 25  GLUCOSE 96 201*  BUN 30* 29*  CREATININE 2.29* 2.36*  CALCIUM 9.7 9.3   GFR: CrCl cannot be calculated  (Unknown ideal weight.). Liver Function Tests: Recent Labs  Lab 05/22/19 1524 05/23/19 0006  AST 18 18  ALT 20 19  ALKPHOS 48 46  BILITOT 1.0 0.6  PROT 7.0 6.8  ALBUMIN 4.1 3.8   Recent Labs  Lab 05/22/19 1524  LIPASE 38   No results for input(s): AMMONIA in the last 168 hours. Coagulation Profile: No results for input(s): INR, PROTIME in the last 168 hours. Cardiac Enzymes: No results for input(s): CKTOTAL, CKMB, CKMBINDEX, TROPONINI in the last 168 hours. BNP (last 3 results) No results for input(s): PROBNP in the last 8760 hours. HbA1C: No results for input(s): HGBA1C in the last 72 hours. CBG: No results for input(s): GLUCAP in the last 168 hours. Lipid Profile: No results for input(s): CHOL, HDL, LDLCALC, TRIG, CHOLHDL, LDLDIRECT in the last 72 hours. Thyroid Function Tests: No results for input(s): TSH, T4TOTAL, FREET4, T3FREE, THYROIDAB in the last 72 hours. Anemia Panel: No results for input(s): VITAMINB12, FOLATE, FERRITIN, TIBC, IRON, RETICCTPCT in the last 72 hours. Urine analysis:    Component Value Date/Time   COLORURINE YELLOW 05/22/2019 1550   APPEARANCEUR HAZY (A) 05/22/2019 1550   LABSPEC 1.014 05/22/2019 1550   PHURINE 7.0 05/22/2019 1550   GLUCOSEU NEGATIVE 05/22/2019 1550   GLUCOSEU NEGATIVE 01/15/2014 0735   HGBUR NEGATIVE 05/22/2019 1550   BILIRUBINUR NEGATIVE 05/22/2019 1550   KETONESUR NEGATIVE 05/22/2019 1550   PROTEINUR NEGATIVE 05/22/2019 1550   UROBILINOGEN 0.2 01/15/2014 0735   NITRITE POSITIVE (A) 05/22/2019 1550   LEUKOCYTESUR LARGE (A) 05/22/2019 1550   Sepsis Labs: Invalid input(s): PROCALCITONIN, LACTICIDVEN  Recent Results (from the past 240 hour(s))  SARS CORONAVIRUS 2 (TAT 6-24 HRS) Nasopharyngeal Nasopharyngeal Swab     Status: None   Collection Time: 05/22/19 10:28 PM   Specimen: Nasopharyngeal Swab  Result Value Ref Range Status   SARS Coronavirus 2 NEGATIVE NEGATIVE Final    Comment: (NOTE) SARS-CoV-2 target  nucleic acids are NOT DETECTED. The SARS-CoV-2 RNA is generally detectable in upper and lower respiratory specimens during the acute phase of infection. Negative results do not preclude SARS-CoV-2 infection, do not rule out co-infections with other pathogens, and should not be used as the sole basis for treatment or other patient management decisions. Negative results must be combined with clinical observations, patient history, and epidemiological information. The expected result is Negative. Fact Sheet for Patients: HairSlick.nohttps://www.fda.gov/media/138098/download Fact Sheet for Healthcare Providers: quierodirigir.comhttps://www.fda.gov/media/138095/download This test is not yet approved or cleared by the [place] FDA and  has been authorized for detection and/or diagnosis of SARS-CoV-2 by FDA under an Emergency Use Authorization (EUA). This EUA will remain  in effect (meaning this test can be used) for the duration of the COVID-19 declaration under Section 56 4(b)(1) of the Act, 21 U.S.C. section 360bbb-3(b)(1), unless the authorization is terminated or revoked sooner. Performed at [facility], [address] [place], [place] [address]       Radiology Studies: Ct Abdomen Pelvis Wo Contrast  Result Date: 05/22/2019 CLINICAL DATA:  Acute right lower quadrant pain.  Loose  stools. EXAM: CT ABDOMEN AND PELVIS WITHOUT CONTRAST TECHNIQUE: Multidetector CT imaging of the abdomen and pelvis was performed following the standard protocol without IV contrast. COMPARISON:  CT abdomen pelvis dated December 01, 2009. FINDINGS: Lower chest: No acute abnormality. Hepatobiliary: Hepatic steatosis. No focal liver abnormality. The gallbladder is contracted. No biliary dilatation. Pancreas: Unremarkable. No pancreatic ductal dilatation or surrounding inflammatory changes. Spleen: Normal in size without focal abnormality. Adrenals/Urinary Tract: The adrenal glands are unremarkable. Punctate nonobstructive calculus in  the right kidney. Moderate to severe bilateral hydroureteronephrosis. There are three 3-4 mm calculi in the bladder near the right UVJ. There are two additional calculi in the dependent midline bladder measuring up to 6 mm. Significant bladder distention with several diverticula. Stomach/Bowel: Extensive sigmoid diverticulosis. Wall thickening of the mid sigmoid colon with surrounding inflammatory changes, consistent with acute diverticulitis. Small hiatal hernia. The stomach is otherwise within normal limits. No obstruction. Normal appendix. Vascular/Lymphatic: Aortic atherosclerosis. No enlarged abdominal or pelvic lymph nodes. Reproductive: Prostatomegaly with central gland hypertrophy. Other: No free fluid, fluid collection, or pneumoperitoneum. Musculoskeletal: No acute or significant osseous findings. IMPRESSION: 1. Acute diverticulitis of the mid sigmoid colon. No abscess or perforation. 2. Markedly distended bladder with moderate to severe bilateral hydroureteronephrosis secondary to urinary retention from bladder outlet obstruction. 3. Five calculi within the bladder, three of which are at or just beyond the right UVJ. 4. Hepatic steatosis. 5.  Aortic atherosclerosis (ICD10-I70.0). These results will be called to the ordering clinician or representative by the Radiologist Assistant, and communication documented in the PACS or zVision Dashboard. Electronically Signed   By: Obie Dredge M.D.   On: 05/22/2019 14:01    Pamella Pert, MD, PhD [facility]  Contact via  [email or web address]  [facility] Office Info P: [phone] F: [phone]

## 2019-05-24 LAB — BASIC METABOLIC PANEL
Anion gap: 9 (ref 5–15)
BUN: 20 mg/dL (ref 8–23)
CO2: 24 mmol/L (ref 22–32)
Calcium: 8.8 mg/dL — ABNORMAL LOW (ref 8.9–10.3)
Chloride: 105 mmol/L (ref 98–111)
Creatinine, Ser: 2.02 mg/dL — ABNORMAL HIGH (ref 0.61–1.24)
GFR calc Af Amer: 40 mL/min — ABNORMAL LOW (ref >60)
GFR calc non Af Amer: 34 mL/min — ABNORMAL LOW (ref >60)
Glucose, Bld: 97 mg/dL (ref 70–99)
Potassium: 3.8 mmol/L (ref 3.5–5.1)
Sodium: 138 mmol/L (ref 135–145)

## 2019-05-24 MED ORDER — METRONIDAZOLE 500 MG PO TABS: 500.0000 mg | ORAL_TABLET | Freq: Three times a day (TID) | ORAL | 0 refills | Status: AC

## 2019-05-24 MED ORDER — CIPROFLOXACIN HCL 500 MG PO TABS: 500.0000 mg | ORAL_TABLET | Freq: Two times a day (BID) | ORAL | 0 refills | Status: AC

## 2019-05-24 NOTE — Discharge Instructions (Addendum)
Acute Kidney Injury, Adult ° °Acute kidney injury is a sudden worsening of kidney function. The kidneys are organs that have several jobs. They filter the blood to remove waste products and extra fluid. They also maintain a healthy balance of minerals and hormones in the body, which helps control blood pressure and keep bones strong. With this condition, your kidneys do not do their jobs as well as they should. °This condition ranges from mild to severe. Over time it may develop into long-lasting (chronic) kidney disease. Early detection and treatment may prevent acute kidney injury from developing into a chronic condition. °What are the causes? °Common causes of this condition include: °· A problem with blood flow to the kidneys. This may be caused by: °? Low blood pressure (hypotension) or shock. °? Blood loss. °? Heart and blood vessel (cardiovascular) disease. °? Severe burns. °? Liver disease. °· Direct damage to the kidneys. This may be caused by: °? Certain medicines. °? A kidney infection. °? Poisoning. °? Being around or in contact with toxic substances. °? A surgical wound. °? A hard, direct hit to the kidney area. °· A sudden blockage of urine flow. This may be caused by: °? Cancer. °? Kidney stones. °? An enlarged prostate in males. °What are the signs or symptoms? °Symptoms of this condition may not be obvious until the condition becomes severe. Symptoms of this condition can include: °· Tiredness (lethargy), or difficulty staying awake. °· Nausea or vomiting. °· Swelling (edema) of the face, legs, ankles, or feet. °· Problems with urination, such as: °? Abdominal pain, or pain along the side of your stomach (flank). °? Decreased urine production. °? Decrease in the force of urine flow. °· Muscle twitches and cramps, especially in the legs. °· Confusion or trouble concentrating. °· Loss of appetite. °· Fever. °How is this diagnosed? °This condition may be diagnosed with tests, including: °· Blood  tests. °· Urine tests. °· Imaging tests. °· A test in which a sample of tissue is removed from the kidneys to be examined under a microscope (kidney biopsy). °How is this treated? °Treatment for this condition depends on the cause and how severe the condition is. In mild cases, treatment may not be needed. The kidneys may heal on their own. In more severe cases, treatment will involve: °· Treating the cause of the kidney injury. This may involve changing any medicines you are taking or adjusting your dosage. °· Fluids. You may need specialized IV fluids to balance your body's needs. °· Having a catheter placed to drain urine and prevent blockages. °· Preventing problems from occurring. This may mean avoiding certain medicines or procedures that can cause further injury to the kidneys. °In some cases treatment may also require: °· A procedure to remove toxic wastes from the body (dialysis or continuous renal replacement therapy - CRRT). °· Surgery. This may be done to repair a torn kidney, or to remove the blockage from the urinary system. °Follow these instructions at home: °Medicines °· Take over-the-counter and prescription medicines only as told by your health care provider. °· Do not take any new medicines without your health care provider's approval. Many medicines can worsen your kidney damage. °· Do not take any vitamin and mineral supplements without your health care provider's approval. Many nutritional supplements can worsen your kidney damage. °Lifestyle °· If your health care provider prescribed changes to your diet, follow them. You may need to decrease the amount of protein you eat. °· Achieve and maintain a healthy   weight. If you need help with this, ask your health care provider.  Start or continue an exercise plan. Try to exercise at least 30 minutes a day, 5 days a week.  Do not use any tobacco products, such as cigarettes, chewing tobacco, and e-cigarettes. If you need help quitting, ask your  health care provider. General instructions  Keep track of your blood pressure. Report changes in your blood pressure as told by your health care provider.  Stay up to date with immunizations. Ask your health care provider which immunizations you need.  Keep all follow-up visits as told by your health care provider. This is important. Where to find more information  [employer] of Kidney Patients: ResidentialShow.is  [employer]: www.kidney.org  [employer]: FightingMatch.com.ee  [employer] [facility]: ? www.lifeoptions.org ? www.kidneyschool.org Contact a health care provider if:  Your symptoms get worse.  You develop new symptoms. Get help right away if:  You develop symptoms of worsening kidney disease, which include: ? Headaches. ? Abnormally dark or light skin. ? Easy bruising. ? Frequent hiccups. ? Chest pain. ? Shortness of breath. ? End of menstruation in women. ? Seizures. ? Confusion or altered mental status. ? Abdominal or back pain. ? Itchiness.  You have a fever.  Your body is producing less urine.  You have pain or bleeding when you urinate. Summary  Acute kidney injury is a sudden worsening of kidney function.  Acute kidney injury can be caused by problems with blood flow to the kidneys, direct damage to the kidneys, and sudden blockage of urine flow.  Symptoms of this condition may not be obvious until it becomes severe. Symptoms may include edema, lethargy, confusion, nausea or vomiting, and problems passing urine.  This condition can usually be diagnosed with blood tests, urine tests, and imaging tests. Sometimes a kidney biopsy is done to diagnose this condition.  Treatment for this condition often involves treating the underlying cause. It is treated with fluids, medicines, dialysis, diet changes, or surgery. This information is not intended to replace advice given to you by your health care provider. Make  sure you discuss any questions you have with your health care provider. Document Released: 03/20/2011 Document Revised: 08/17/2017 Document Reviewed: 08/25/2016 Elsevier Patient Education  2020 [employer]   Follow with Creola Corn, MD as scheduled Follow up with Urology clinic as scheduled  Please get a complete blood count and chemistry panel checked by your Primary MD at your next visit, and again as instructed by your Primary MD. Please get your medications reviewed and adjusted by your Primary MD.  Please request your Primary MD to go over all Hospital Tests and Procedure/Radiological results at the follow up, please get all Hospital records sent to your Prim MD by signing hospital release before you go home.  In some cases, there will be blood work, cultures and biopsy results pending at the time of your discharge. Please request that your primary care M.D. goes through all the records of your hospital data and follows up on these results.  If you had Pneumonia of Lung problems at the Hospital: Please get a 2 view Chest X ray done in 6-8 weeks after hospital discharge or sooner if instructed by your Primary MD.  If you have Congestive Heart Failure: Please call your Cardiologist or Primary MD anytime you have any of the following symptoms:  1) 3 pound weight gain in 24 hours or 5 pounds in 1 week  2) shortness of  breath, with or without a dry hacking cough  3) swelling in the hands, feet or stomach  4) if you have to sleep on extra pillows at night in order to breathe  Follow cardiac low salt diet and 1.5 lit/day fluid restriction.  If you have diabetes Accuchecks 4 times/day, Once in AM empty stomach and then before each meal. Log in all results and show them to your primary doctor at your next visit. If any glucose reading is under 80 or above 300 call your primary MD immediately.  If you have Seizure/Convulsions/Epilepsy: Please do not drive, operate heavy machinery,  participate in activities at heights or participate in high speed sports until you have seen by Primary MD or a Neurologist and advised to do so again. [place] [place], patients with seizures are not allowed to drive until they have been seizure-free for six months.  Use caution when using heavy equipment or power tools. Avoid working on ladders or at heights. Take showers instead of baths. Ensure the water temperature is not too high on the home water heater. Do not go swimming alone. Do not lock yourself in a room alone (i.e. bathroom). When caring for infants or small children, sit down when holding, feeding, or changing them to minimize risk of injury to the child in the event you have a seizure. Maintain good sleep hygiene. Avoid alcohol.   If you had Gastrointestinal Bleeding: Please ask your Primary MD to check a complete blood count within one week of discharge or at your next visit. Your endoscopic/colonoscopic biopsies that are pending at the time of discharge, will also need to followed by your Primary MD.  Get Medicines reviewed and adjusted. Please take all your medications with you for your next visit with your Primary MD  Please request your Primary MD to go over all hospital tests and procedure/radiological results at the follow up, please ask your Primary MD to get all Hospital records sent to his/her office.  If you experience worsening of your admission symptoms, develop shortness of breath, life threatening emergency, suicidal or homicidal thoughts you must seek medical attention immediately by calling 911 or calling your MD immediately  if symptoms less severe.  You must read complete instructions/literature along with all the possible adverse reactions/side effects for all the Medicines you take and that have been prescribed to you. Take any new Medicines after you have completely understood and accpet all the possible adverse reactions/side effects.   Do not  drive or operate heavy machinery when taking Pain medications.   Do not take more than prescribed Pain, Sleep and Anxiety Medications  Special Instructions: If you have smoked or chewed Tobacco  in the last 2 yrs please stop smoking, stop any regular Alcohol  and or any Recreational drug use.  Wear Seat belts while driving.  Please note You were cared for by a hospitalist during your hospital stay. If you have any questions about your discharge medications or the care you received while you were in the hospital after you are discharged, you can call the unit and asked to speak with the hospitalist on call if the hospitalist that took care of you is not available. Once you are discharged, your primary care physician will handle any further medical issues. Please note that NO REFILLS for any discharge medications will be authorized once you are discharged, as it is imperative that you return to your primary care physician (or establish a relationship with a primary care  physician if you do not have one) for your aftercare needs so that they can reassess your need for medications and monitor your lab values.  You can reach the hospitalist office at phone [phone] or fax [phone]   If you do not have a primary care physician, you can call [phone] for a physician referral.  Activity: As tolerated with Full fall precautions use walker/cane & assistance as needed    Diet: regular  Disposition Home

## 2019-05-24 NOTE — Discharge Summary (Signed)
Physician Discharge Summary  FRANCISCO MINTO EGB:[id number] DOB: 1956/07/28 DOA: 05/22/2019  PCP: Creola Corn, MD  Admit date: 05/22/2019 Discharge date: 05/24/2019  Admitted From: home Disposition:  home  Recommendations for Outpatient Follow-up:  1. Follow up with Dr. Timothy Lasso in 1-2 weeks 2. Follow up with Urology in 5 days as scheduled 3. Please obtain BMP/CBC in one week  Home Health: none Equipment/Devices: none  Discharge Condition: stable CODE STATUS: Full code Diet recommendation: regular  HPI: Per admitting MD, Dwayne Larsen is a 63 y.o. male with medical history significant of gout, coronary artery disease, BPH, hyperlipidemia, degenerative joint disease who was sent over from his PCPs office with suprapubic pain general weakness nausea vomiting for a few days.  Patient had CT abdomen and pelvis done that showed significant hydronephrosis bilaterally and may be kidney stones.  He was seen and Foley catheter inserted.  Patient had immediate large volume of urine with hematuria.  He was also noted to have acute kidney injury as well as evidence of UTI.  Urology consulted with recommendations to leave Foley in place treat patient and have outpatient follow-up.  Patient denied any fever or chills no nausea vomiting or diarrhea. ED Course: Temperature 97.3 blood pressure 150/87 pulse 59 respirate of 17 oxygen sat 97% room air.  Sodium 139 potassium 3.5 chloride 101 CO2 25 glucose 201 BUN 29 creatinine 2.36.  Calcium 9.3.  White count is 7.6 hemoglobin 12.8.  Platelets 184.  Urinalysis showed hazy urine with large leukocytes nitrite positive many bacteria and WBC more than 50.  CT abdomen pelvis showed acute diverticulitis of the mid sigmoid colon.  No abscess or perforation.  Also markedly distended bladder with moderate to severe bilateral hydro-retro-nephrosis secondary to urinary retention.-Calculi within the bladder 3 of which are at or just beyond the right UVJ.  Patient has Foley catheter  inserted and is being admitted treatment.  Hospital Course: Principal Problem Acute kidney injury due to bilateral obstructive uropathy on underlying chronic kidney disease probable stage III -Possibly secondary to nephrolithiasis, he had a Foley catheter placed.  EDP discussed case with Dr. Mena Goes who recommended outpatient follow-up in a week and maintenance of the Foley catheter as an outpatient.  Patient was able to call the urology clinic and has scheduled an appointment in 5 days next Wednesday.  His creatinine improved with decompression, was as high as 2.36 and improved to 2.0 on discharge. He does have a degree of chronically elevated creatinine around 1.3 back in 2017-18 and 1.5 in 2019.    He was advised to avoid NSAIDs until his renal function stabilizes  Active Problems History of BPH -To follow-up with urology next week CAD -No chest pain, stable, continue home medications Acute diverticulitis -no fever, no nausea, no vomiting, tolerating diet.  He has very mild abdominal discomfort which might very well be related to #1.  Given concern for concomitant UTI I would favor treat both, he will be placed on ciprofloxacin and metronidazole on discharge for 8 additional days to complete a 10-day course UTI -Cultures with multiple species, none predominant.  Discharge Diagnoses:  Principal Problem:   Acute bilateral obstructive uropathy Active Problems:   Dyslipidemia   Hyperlipidemia   BPH (benign prostatic hyperplasia)   CAD (coronary artery disease)   ARF (acute renal failure) (HCC)   Acute diverticulitis   UTI (urinary tract infection)  Discharge Instructions  Allergies as of 05/24/2019      Reactions   Other Itching, Rash  Walnuts and pecans   Penicillins Other (See Comments)   Did it involve swelling of the face/tongue/throat, SOB, or low BP?No Did it involve sudden or severe rash/hives, skin peeling, or any reaction on the inside of your mouth or nose? yes Did you need  to seek medical attention at a hospital or doctor's office? no When did it last happen?63yrs old If all above answers are NO, may proceed with cephalosporin use.      Medication List    STOP taking these medications   meloxicam 15 MG tablet Commonly known as: MOBIC     TAKE these medications   aspirin 81 MG tablet Take 81 mg by mouth daily.   atorvastatin 80 MG tablet Commonly known as: LIPITOR TAKE 1 TABLET BY MOUTH DAILY AT 6 PM What changed: See the new instructions.   ciprofloxacin 500 MG tablet Commonly known as: Cipro Take 1 tablet (500 mg total) by mouth 2 (two) times daily for 8 days.   fenofibrate 160 MG tablet TAKE 1 TABLET BY MOUTH ONCE DAILY   metoprolol tartrate 25 MG tablet Commonly known as: LOPRESSOR Take 0.5 tablets (12.5 mg total) by mouth 2 (two) times daily. Please make overdue appt with Dr. Acie Fredrickson before anymore refills. 1st attempt What changed: additional instructions   metroNIDAZOLE 500 MG tablet Commonly known as: Flagyl Take 1 tablet (500 mg total) by mouth 3 (three) times daily for 8 days.   naphazoline-pheniramine 0.025-0.3 % ophthalmic solution Commonly known as: NAPHCON-A Place 1 drop into both eyes 4 (four) times daily as needed for eye irritation.   nitroGLYCERIN 0.4 MG SL tablet Commonly known as: NITROSTAT PLACE ONE TABLET UNDER THE TONGUE EVERY 5 MINUTES AS NEEDED FOR CHEST PAIN What changed: See the new instructions.   oxymetazoline 0.05 % nasal spray Commonly known as: AFRIN Place 1 spray into both nostrils daily.   tamsulosin 0.4 MG Caps capsule Commonly known as: FLOMAX Take 0.4 mg by mouth daily.   zolpidem 10 MG tablet Commonly known as: AMBIEN Take 10 mg by mouth at bedtime as needed for sleep.      Follow-up Information    Festus Aloe, MD. Schedule an appointment as soon as possible for a visit in 1 week(s).   Specialty: Urology Contact information: [place]  [address] [phone]        Shon Baton, MD Follow up.   Specialty: Internal Medicine Why: as scheduled for annual visit in a month Contact information: [place] [address] [phone]           Consultations:  Urology over the phone  Procedures/Studies:  Ct Abdomen Pelvis Wo Contrast  Result Date: 05/22/2019 CLINICAL DATA:  Acute right lower quadrant pain.  Loose stools. EXAM: CT ABDOMEN AND PELVIS WITHOUT CONTRAST TECHNIQUE: Multidetector CT imaging of the abdomen and pelvis was performed following the standard protocol without IV contrast. COMPARISON:  CT abdomen pelvis dated December 01, 2009. FINDINGS: Lower chest: No acute abnormality. Hepatobiliary: Hepatic steatosis. No focal liver abnormality. The gallbladder is contracted. No biliary dilatation. Pancreas: Unremarkable. No pancreatic ductal dilatation or surrounding inflammatory changes. Spleen: Normal in size without focal abnormality. Adrenals/Urinary Tract: The adrenal glands are unremarkable. Punctate nonobstructive calculus in the right kidney. Moderate to severe bilateral hydroureteronephrosis. There are three 3-4 mm calculi in the bladder near the right UVJ. There are two additional calculi in the dependent midline bladder measuring up to 6 mm. Significant bladder distention with several diverticula. Stomach/Bowel: Extensive sigmoid  diverticulosis. Wall thickening of the mid sigmoid colon with surrounding inflammatory changes, consistent with acute diverticulitis. Small hiatal hernia. The stomach is otherwise within normal limits. No obstruction. Normal appendix. Vascular/Lymphatic: Aortic atherosclerosis. No enlarged abdominal or pelvic lymph nodes. Reproductive: Prostatomegaly with central gland hypertrophy. Other: No free fluid, fluid collection, or pneumoperitoneum. Musculoskeletal: No acute or significant osseous findings. IMPRESSION: 1. Acute diverticulitis of the mid sigmoid colon. No abscess or  perforation. 2. Markedly distended bladder with moderate to severe bilateral hydroureteronephrosis secondary to urinary retention from bladder outlet obstruction. 3. Five calculi within the bladder, three of which are at or just beyond the right UVJ. 4. Hepatic steatosis. 5.  Aortic atherosclerosis (ICD10-I70.0). These results will be called to the ordering clinician or representative by the Radiologist Assistant, and communication documented in the PACS or zVision Dashboard. Electronically Signed   By: Obie DredgeWilliam T Derry M.D.   On: 05/22/2019 14:01      Subjective: - no chest pain, shortness of breath, no abdominal pain, nausea or vomiting.   Discharge Exam: BP (!) 144/93 (BP Location: Left Arm)    Pulse 66    Temp 98 F (36.7 C) (Oral)    Resp 16    SpO2 99%   General: Pt is alert, awake, not in acute distress Cardiovascular: RRR, S1/S2 +, no rubs, no gallops Respiratory: CTA bilaterally, no wheezing, no rhonchi Abdominal: Soft, NT, ND, bowel sounds + Extremities: no edema, no cyanosis   The results of significant diagnostics from this hospitalization (including imaging, microbiology, ancillary and laboratory) are listed below for reference.     Microbiology: Recent Results (from the past 240 hour(s))  Urine culture     Status: Abnormal   Collection Time: 05/22/19  8:01 PM   Specimen: In/Out Cath Urine  Result Value Ref Range Status   Specimen Description IN/OUT CATH URINE  Final   Special Requests   Final    NONE Performed at [facility], [address] [place], [place] [address]    Culture MULTIPLE SPECIES PRESENT, SUGGEST RECOLLECTION (A)  Final   Report Status 05/23/2019 FINAL  Final  SARS CORONAVIRUS 2 (TAT 6-24 HRS) Nasopharyngeal Nasopharyngeal Swab     Status: None   Collection Time: 05/22/19 10:28 PM   Specimen: Nasopharyngeal Swab  Result Value Ref Range Status   SARS Coronavirus 2 NEGATIVE NEGATIVE Final    Comment: (NOTE) SARS-CoV-2 target nucleic acids are  NOT DETECTED. The SARS-CoV-2 RNA is generally detectable in upper and lower respiratory specimens during the acute phase of infection. Negative results do not preclude SARS-CoV-2 infection, do not rule out co-infections with other pathogens, and should not be used as the sole basis for treatment or other patient management decisions. Negative results must be combined with clinical observations, patient history, and epidemiological information. The expected result is Negative. Fact Sheet for Patients: HairSlick.nohttps://www.fda.gov/media/138098/download Fact Sheet for Healthcare Providers: quierodirigir.comhttps://www.fda.gov/media/138095/download This test is not yet approved or cleared by the [place] FDA and  has been authorized for detection and/or diagnosis of SARS-CoV-2 by FDA under an Emergency Use Authorization (EUA). This EUA will remain  in effect (meaning this test can be used) for the duration of the COVID-19 declaration under Section 56 4(b)(1) of the Act, 21 U.S.C. section 360bbb-3(b)(1), unless the authorization is terminated or revoked sooner. Performed at [facility], [address] [place], [place] [address]      Labs: BNP (last 3 results) No results for input(s): BNP in the last 8760 hours.  Basic Metabolic Panel: Recent Labs  Lab 05/22/19 1524 05/23/19 0006 05/24/19 0344  NA 137 139 138  K 3.8 3.5 3.8  CL 104 101 105  CO2 22 25 24   GLUCOSE 96 201* 97  BUN 30* 29* 20  CREATININE 2.29* 2.36* 2.02*  CALCIUM 9.7 9.3 8.8*   Liver Function Tests: Recent Labs  Lab 05/22/19 1524 05/23/19 0006  AST 18 18  ALT 20 19  ALKPHOS 48 46  BILITOT 1.0 0.6  PROT 7.0 6.8  ALBUMIN 4.1 3.8   Recent Labs  Lab 05/22/19 1524  LIPASE 38   No results for input(s): AMMONIA in the last 168 hours. CBC: Recent Labs  Lab 05/22/19 1524 05/23/19 0006  WBC 8.8 7.6  HGB 13.1 12.8*  HCT 39.9 39.1  MCV 83.8 83.7  PLT 175 184   Cardiac Enzymes: No results for input(s): CKTOTAL,  CKMB, CKMBINDEX, TROPONINI in the last 168 hours. BNP: Invalid input(s): POCBNP CBG: No results for input(s): GLUCAP in the last 168 hours. D-Dimer No results for input(s): DDIMER in the last 72 hours. Hgb A1c No results for input(s): HGBA1C in the last 72 hours. Lipid Profile No results for input(s): CHOL, HDL, LDLCALC, TRIG, CHOLHDL, LDLDIRECT in the last 72 hours. Thyroid function studies No results for input(s): TSH, T4TOTAL, T3FREE, THYROIDAB in the last 72 hours.  Invalid input(s): FREET3 Anemia work up No results for input(s): VITAMINB12, FOLATE, FERRITIN, TIBC, IRON, RETICCTPCT in the last 72 hours. Urinalysis    Component Value Date/Time   COLORURINE YELLOW 05/22/2019 1550   APPEARANCEUR HAZY (A) 05/22/2019 1550   LABSPEC 1.014 05/22/2019 1550   PHURINE 7.0 05/22/2019 1550   GLUCOSEU NEGATIVE 05/22/2019 1550   GLUCOSEU NEGATIVE 01/15/2014 0735   HGBUR NEGATIVE 05/22/2019 1550   BILIRUBINUR NEGATIVE 05/22/2019 1550   KETONESUR NEGATIVE 05/22/2019 1550   PROTEINUR NEGATIVE 05/22/2019 1550   UROBILINOGEN 0.2 01/15/2014 0735   NITRITE POSITIVE (A) 05/22/2019 1550   LEUKOCYTESUR LARGE (A) 05/22/2019 1550   Sepsis Labs Invalid input(s): PROCALCITONIN,  WBC,  LACTICIDVEN  FURTHER DISCHARGE INSTRUCTIONS:   Get Medicines reviewed and adjusted: Please take all your medications with you for your next visit with your Primary MD   Laboratory/radiological data: Please request your Primary MD to go over all hospital tests and procedure/radiological results at the follow up, please ask your Primary MD to get all Hospital records sent to his/her office.   In some cases, they will be blood work, cultures and biopsy results pending at the time of your discharge. Please request that your primary care M.D. goes through all the records of your hospital data and follows up on these results.   Also Note the following: If you experience worsening of your admission symptoms, develop  shortness of breath, life threatening emergency, suicidal or homicidal thoughts you must seek medical attention immediately by calling 911 or calling your MD immediately  if symptoms less severe.   You must read complete instructions/literature along with all the possible adverse reactions/side effects for all the Medicines you take and that have been prescribed to you. Take any new Medicines after you have completely understood and accpet all the possible adverse reactions/side effects.    Do not drive when taking Pain medications or sleeping medications (Benzodaizepines)   Do not take more than prescribed Pain, Sleep and Anxiety Medications. It is not advisable to combine anxiety,sleep and pain medications without talking with your primary care practitioner   Special Instructions: If you have smoked or  chewed Tobacco  in the last 2 yrs please stop smoking, stop any regular Alcohol  and or any Recreational drug use.   Wear Seat belts while driving.   Please note: You were cared for by a hospitalist during your hospital stay. Once you are discharged, your primary care physician will handle any further medical issues. Please note that NO REFILLS for any discharge medications will be authorized once you are discharged, as it is imperative that you return to your primary care physician (or establish a relationship with a primary care physician if you do not have one) for your post hospital discharge needs so that they can reassess your need for medications and monitor your lab values.  Time coordinating discharge: 35 minutes  SIGNED:  Pamella Pert, MD, PhD 05/24/2019, 1:46 PM

## 2019-05-27 DIAGNOSIS — N183 Chronic kidney disease, stage 3 (moderate): Secondary | ICD-10-CM | POA: Diagnosis not present

## 2019-05-28 DIAGNOSIS — N1330 Unspecified hydronephrosis: Secondary | ICD-10-CM | POA: Diagnosis not present

## 2019-05-28 DIAGNOSIS — R351 Nocturia: Secondary | ICD-10-CM | POA: Diagnosis not present

## 2019-05-28 DIAGNOSIS — R338 Other retention of urine: Secondary | ICD-10-CM | POA: Diagnosis not present

## 2019-05-28 DIAGNOSIS — R3914 Feeling of incomplete bladder emptying: Secondary | ICD-10-CM | POA: Diagnosis not present

## 2019-05-29 DIAGNOSIS — N1330 Unspecified hydronephrosis: Secondary | ICD-10-CM | POA: Diagnosis not present

## 2019-05-29 DIAGNOSIS — R3914 Feeling of incomplete bladder emptying: Secondary | ICD-10-CM | POA: Diagnosis not present

## 2019-06-02 DIAGNOSIS — Z23 Encounter for immunization: Secondary | ICD-10-CM | POA: Diagnosis not present

## 2019-06-06 DIAGNOSIS — N183 Chronic kidney disease, stage 3 (moderate): Secondary | ICD-10-CM | POA: Diagnosis not present

## 2019-06-11 DIAGNOSIS — R3914 Feeling of incomplete bladder emptying: Secondary | ICD-10-CM | POA: Diagnosis not present

## 2019-06-11 DIAGNOSIS — R338 Other retention of urine: Secondary | ICD-10-CM | POA: Diagnosis not present

## 2019-06-21 ENCOUNTER — Other Ambulatory Visit: Payer: Self-pay | Admitting: Cardiovascular Disease

## 2019-07-04 DIAGNOSIS — R338 Other retention of urine: Secondary | ICD-10-CM | POA: Diagnosis not present

## 2019-07-04 DIAGNOSIS — R3914 Feeling of incomplete bladder emptying: Secondary | ICD-10-CM | POA: Diagnosis not present

## 2019-07-04 DIAGNOSIS — N390 Urinary tract infection, site not specified: Secondary | ICD-10-CM | POA: Diagnosis not present

## 2019-07-16 ENCOUNTER — Other Ambulatory Visit: Payer: Self-pay | Admitting: Cardiovascular Disease

## 2019-07-18 DIAGNOSIS — R3914 Feeling of incomplete bladder emptying: Secondary | ICD-10-CM | POA: Diagnosis not present

## 2019-07-18 DIAGNOSIS — N130 Hydronephrosis with ureteropelvic junction obstruction: Secondary | ICD-10-CM | POA: Diagnosis not present

## 2019-07-21 DIAGNOSIS — Z125 Encounter for screening for malignant neoplasm of prostate: Secondary | ICD-10-CM | POA: Diagnosis not present

## 2019-07-21 DIAGNOSIS — M109 Gout, unspecified: Secondary | ICD-10-CM | POA: Diagnosis not present

## 2019-07-21 DIAGNOSIS — E7849 Other hyperlipidemia: Secondary | ICD-10-CM | POA: Diagnosis not present

## 2019-07-21 DIAGNOSIS — I10 Essential (primary) hypertension: Secondary | ICD-10-CM | POA: Diagnosis not present

## 2019-07-21 DIAGNOSIS — Z0000 Encounter for general adult medical examination without abnormal findings: Secondary | ICD-10-CM | POA: Diagnosis not present

## 2019-07-24 DIAGNOSIS — R82998 Other abnormal findings in urine: Secondary | ICD-10-CM | POA: Diagnosis not present

## 2019-07-28 DIAGNOSIS — I2510 Atherosclerotic heart disease of native coronary artery without angina pectoris: Secondary | ICD-10-CM | POA: Diagnosis not present

## 2019-07-28 DIAGNOSIS — N210 Calculus in bladder: Secondary | ICD-10-CM | POA: Diagnosis not present

## 2019-07-28 DIAGNOSIS — N320 Bladder-neck obstruction: Secondary | ICD-10-CM | POA: Diagnosis not present

## 2019-07-28 DIAGNOSIS — N1330 Unspecified hydronephrosis: Secondary | ICD-10-CM | POA: Diagnosis not present

## 2019-07-28 DIAGNOSIS — Z1212 Encounter for screening for malignant neoplasm of rectum: Secondary | ICD-10-CM | POA: Diagnosis not present

## 2019-07-28 DIAGNOSIS — Z0000 Encounter for general adult medical examination without abnormal findings: Secondary | ICD-10-CM | POA: Diagnosis not present

## 2019-07-28 DIAGNOSIS — N3090 Cystitis, unspecified without hematuria: Secondary | ICD-10-CM | POA: Insufficient documentation

## 2019-07-28 DIAGNOSIS — I129 Hypertensive chronic kidney disease with stage 1 through stage 4 chronic kidney disease, or unspecified chronic kidney disease: Secondary | ICD-10-CM | POA: Diagnosis not present

## 2019-07-31 DIAGNOSIS — R3914 Feeling of incomplete bladder emptying: Secondary | ICD-10-CM | POA: Diagnosis not present

## 2019-07-31 DIAGNOSIS — N401 Enlarged prostate with lower urinary tract symptoms: Secondary | ICD-10-CM | POA: Diagnosis not present

## 2019-08-04 ENCOUNTER — Other Ambulatory Visit: Payer: Self-pay

## 2019-08-04 DIAGNOSIS — Z20822 Contact with and (suspected) exposure to covid-19: Secondary | ICD-10-CM

## 2019-08-06 LAB — NOVEL CORONAVIRUS, NAA: SARS-CoV-2, NAA: NOT DETECTED

## 2019-08-25 ENCOUNTER — Encounter: Payer: Self-pay | Admitting: Cardiology

## 2019-08-25 ENCOUNTER — Other Ambulatory Visit: Payer: Self-pay

## 2019-08-25 ENCOUNTER — Ambulatory Visit (INDEPENDENT_AMBULATORY_CARE_PROVIDER_SITE_OTHER): Payer: 59 | Admitting: Cardiology

## 2019-08-25 VITALS — BP 137/80 | HR 67 | Temp 97.0°F | Ht 67.0 in | Wt 191.0 lb

## 2019-08-25 DIAGNOSIS — R002 Palpitations: Secondary | ICD-10-CM

## 2019-08-25 DIAGNOSIS — E782 Mixed hyperlipidemia: Secondary | ICD-10-CM | POA: Diagnosis not present

## 2019-08-25 DIAGNOSIS — I2510 Atherosclerotic heart disease of native coronary artery without angina pectoris: Secondary | ICD-10-CM

## 2019-08-25 NOTE — Progress Notes (Signed)
Patient referred by Shon Baton, MD for coronary artery disease  Subjective:   Dwayne Larsen, male    DOB: 1956/05/08, 63 y.o.   MRN: [id number]   Chief Complaint  Patient presents with  . Coronary Artery Disease  . New Patient (Initial Visit)     HPI  63 y.o. Caucasian male with controlled hypertension, hyperlipidemia, CAD s/p STEMI and RCA PCI 2015, here to establish care for management of coronary artery disease.  Patient previously used to see Dr. Cathie Olden, last saw him in 05/2018. More recently, he had developed obstructive nephropathy, bilateral hydronephrosis in 05/2019.   He walks daily, around 5,000 steps. On weekends, he plays golf and walks about 15000 steps in a day. He denies chest pain, shortness of breath, leg edema, orthopnea, PND, TIA/syncope. He has occasional palpitations symptoms, lasting for a few min up to an hour, very erratic, without any symptoms of chest pain, shortness of breath.    Past Medical History:  Diagnosis Date  . Abdominal pain, left lower quadrant   . Allergy, unspecified not elsewhere classified   . CAD (coronary artery disease) March 07, 2014   3.0 x 18 mm Xience drug-eluting stent. The stent was postdilated with a 3.25 noncompliant balloon.  . DJD (degenerative joint disease)   . Elevated prostate specific antigen (PSA)   . Gout   . Hypercholesteremia   . Lumbar back pain   . Palpitations      Past Surgical History:  Procedure Laterality Date  . KNEE ARTHROSCOPY     right  . LAMINECTOMY AND MICRODISCECTOMY LUMBAR SPINE  2003   L4-5  . LEFT HEART CATHETERIZATION WITH CORONARY ANGIOGRAM N/A 03/07/2014   Procedure: LEFT HEART CATHETERIZATION WITH CORONARY ANGIOGRAM;  Surgeon: Wellington Hampshire, MD;  Location: [facility] CATH LAB;  Service: Cardiovascular;  Laterality: N/A;  . PERCUTANEOUS CORONARY STENT INTERVENTION (PCI-S)  03/07/2014   Procedure: PERCUTANEOUS CORONARY STENT INTERVENTION (PCI-S);  Surgeon: Wellington Hampshire, MD;  Location: [facility]  CATH LAB;  Service: Cardiovascular;;  . RECTAL SURGERY  2003   Dr. Deon Pilling     Social History   Tobacco Use  Smoking Status Former Smoker  . Packs/day: 1.00  . Years: 10.00  . Pack years: 10.00  . Types: Cigarettes  . Quit date: 09/18/1980  . Years since quitting: 38.9  Smokeless Tobacco Never Used  Tobacco Comment   1/2-1 ppd, ages [age]    Social History   Substance and Sexual Activity  Alcohol Use Yes   Comment: social     Family History  Problem Relation Age of Onset  . Coronary artery disease Father   . Hyperlipidemia Father   . Deep vein thrombosis Father      Current Outpatient Medications on File Prior to Visit  Medication Sig Dispense Refill  . acidophilus (RISAQUAD) CAPS capsule Take by mouth daily.    Marland Kitchen aspirin 81 MG tablet Take 81 mg by mouth daily.      Marland Kitchen atorvastatin (LIPITOR) 80 MG tablet TAKE 1 TABLET BY MOUTH DAILY AT 6 PM (Patient taking differently: Take 80 mg by mouth every evening. ) 30 tablet 0  . fenofibrate 160 MG tablet TAKE 1 TABLET BY MOUTH ONCE DAILY (Patient taking differently: Take 160 mg by mouth daily. ) 90 tablet 3  . Multiple Vitamin (MULTIVITAMIN WITH MINERALS) TABS tablet Take 1 tablet by mouth daily.    . naphazoline-pheniramine (NAPHCON-A) 0.025-0.3 % ophthalmic solution Place 1 drop into both eyes 4 (four) times  daily as needed for eye irritation.    . nitroGLYCERIN (NITROSTAT) 0.4 MG SL tablet PLACE ONE TABLET UNDER THE TONGUE EVERY 5 MINUTES AS NEEDED FOR CHEST PAIN (Patient taking differently: Place 0.4 mg under the tongue every 5 (five) minutes as needed for chest pain. ) 25 tablet 2  . oxymetazoline (AFRIN) 0.05 % nasal spray Place 1 spray into both nostrils daily.    . tamsulosin (FLOMAX) 0.4 MG CAPS capsule Take 0.4 mg by mouth daily.  6  . zolpidem (AMBIEN) 10 MG tablet Take 10 mg by mouth at bedtime as needed for sleep.    . metoprolol tartrate (LOPRESSOR) 25 MG tablet Take 0.5 tablets (12.5 mg total) by mouth 2 (two) times  daily. Please make overdue appt with Dr. Acie Fredrickson before anymore refills. 2nd attempt 7 tablet 0   No current facility-administered medications on file prior to visit.     Cardiovascular and other pertinent studies:  EKG 08/25/2019: Sinus rhythm 54 bpm. Old inferior infarct.   Echocardiogram 2015: - Left ventricle: Average global LV strain is -17.3% The cavity  size was normal. There was mild focal basal hypertrophy of the  septum. Systolic function was mildly reduced. The estimated  ejection fraction was 50%. There is akinesis of the  basal-midinferior myocardium. There is akinesis of the  basalinferoseptal myocardium.  - Mitral valve: There was mild regurgitation.   Coronary angiography and intervention 2015 (Dr. Fletcher Anon): LM: Normal LAD: Prox diffuse 20% disease LCx: Mid 50% stenosis. Mild calcification RCA: Large, dominant. Mid 50% stenosis with distal occlusion with thrombus. Prox-mid PDA 60% stenosis.      Aspiration thrombectomy and PCI dRCA 3.0 X 18 mm Xience DES   Recent labs: 07/21/2019: Glucose 109, BUN/Cr 21/1.9. EGFR 36. Na/K 140/4.4. 13.6Rest of the CMP normal H/H 13.6/42.2. MCV 83.6. Platelets 164 Chol 123, TG 85, HDL 35, LDL 71 TSH normal  06/05/2018: Chol 133, TG 181, HDL 32, LDL 65   05/24/2019: Glucose 97, BUN/Cr 20/2.02. EGFR 34. Na/K 138/3.8. Rest of the CMP normal   Review of Systems  Constitution: Negative for decreased appetite, malaise/fatigue, weight gain and weight loss.  HENT: Negative for congestion.   Eyes: Negative for visual disturbance.  Cardiovascular: Positive for palpitations. Negative for chest pain, dyspnea on exertion, leg swelling and syncope.  Respiratory: Negative for cough.   Endocrine: Negative for cold intolerance.  Hematologic/Lymphatic: Does not bruise/bleed easily.  Skin: Negative for itching and rash.  Musculoskeletal: Negative for myalgias.  Gastrointestinal: Negative for abdominal pain, nausea and vomiting.   Genitourinary: Negative for dysuria.  Neurological: Negative for dizziness and weakness.  Psychiatric/Behavioral: The patient is not nervous/anxious.   All other systems reviewed and are negative.        Vitals:   08/25/19 0834  BP: 137/80  Pulse: 67  Temp: (!) 97 F (36.1 C)  SpO2: 98%     Body mass index is 29.91 kg/m. Filed Weights   08/25/19 0834  Weight: 191 lb (86.6 kg)     Objective:   Physical Exam  Constitutional: He is oriented to person, place, and time. He appears well-developed and well-nourished. No distress.  HENT:  Head: Normocephalic and atraumatic.  Eyes: Pupils are equal, round, and reactive to light. Conjunctivae are normal.  Neck: No JVD present.  Cardiovascular: Normal rate, regular rhythm and intact distal pulses.  No murmur heard. Pulmonary/Chest: Effort normal and breath sounds normal. He has no wheezes. He has no rales.  Abdominal: Soft. Bowel sounds are normal. There  is no rebound.  Musculoskeletal:        General: No edema.  Lymphadenopathy:    He has no cervical adenopathy.  Neurological: He is alert and oriented to person, place, and time. No cranial nerve deficit.  Skin: Skin is warm and dry.  Psychiatric: He has a normal mood and affect.  Nursing note and vitals reviewed.       Assessment & Recommendations:   63 y.o. Caucasian male with controlled hypertension, hyperlipidemia, CAD s/p STEMI and RCA PCI 2015, here to establish care for management of coronary artery disease.  Coronary artery disease: No angina symptoms. Continue Aspirin/lipitor.fenofibrate, metoprolol tartarate 12.5 mg bid.   Palpitations: Recommend 4 week event monitor to exclude tachyarrhtymia  Diet & Lifestyle recommendations:  Physical activity recommendation (The Physical Activity Guidelines for Americans. JAMA 2018;Nov 12) At least 150-300 minutes a week of moderate-intensity, or 75-150 minutes a week of vigorous-intensity aerobic physical activity,  or an equivalent combination of moderate- and vigorous-intensity aerobic activity. Adults should perform muscle-strengthening activities on 2 or more days a week. Older adults should do multicomponent physical activity that includes balance training as well as aerobic and muscle-strengthening activities. Benefits of increased physical activity include lower risk of mortality including cardiovascular mortality, lower risk of cardiovascular events and associated risk factors (hypertension and diabetes), and lower risk of many cancers (including bladder, breast, colon, endometrium, esophagus, kidney, lung, and stomach). Additional improvments have been seen in cognition, risk of dementia, anxiety and depression, improved bone health, lower risk of falls, and associated injuries.  Dietary recommendation The 2019 ACC/AHA guidelines promote nutrition as a main fixture of cardiovascular wellness, with a recommendation for a varied diet of fruit, vegetables, fish, legumes, and whole grains (Class I), as well as recommendations to reduce sodium, cholesterol, processed meats, and refined sugars (Class IIa recommendation).10 Sodium intake, a topic of some controversy as of late, is recommended to be kept at 1,500 mg/day or less, far below the average daily intake in the [place] of 3,409 mg/day, and notably below that of previous [place] recommendations for <2,327m/day.10,11 For those unable to reach 1,500 mg/day, they recommend at least a reduction of 1000 mg/day.  A Pesco-Mediterranean Diet With Intermittent Fasting: JACC Review Topic of the Week. J Am Coll Cardiol 22956;21:3086-5784Pesco-Mediterranean diet, it is supplemented with extra-virgin olive oil (EVOO), which is the principle fat source, along with moderate amounts of dairy (particularly yogurt and cheese) and eggs, as well as modest amounts of alcohol consumption (ideally red wine with the evening meal), but few red and processed meats.   F/u in 1 year.   Thank  you for referring the patient to [facility] Please feel free to contact with any questions.  MNigel Mormon MD [facility] Pager: [phone]: [phone]

## 2019-08-26 ENCOUNTER — Ambulatory Visit: Payer: 59

## 2019-08-26 DIAGNOSIS — R002 Palpitations: Secondary | ICD-10-CM

## 2019-08-27 ENCOUNTER — Other Ambulatory Visit: Payer: Self-pay

## 2019-08-27 MED ORDER — NITROGLYCERIN 0.4 MG SL SUBL: 0.4000 mg | SUBLINGUAL_TABLET | SUBLINGUAL | 3 refills | Status: AC | PRN

## 2019-08-28 ENCOUNTER — Telehealth: Payer: Self-pay

## 2019-08-28 NOTE — Telephone Encounter (Signed)
Patient wanted 3 vials of Nitro. One vial with 3 refills was called in. He picked up one vial but says he needs 2 more vials to keep at separate locations. I called pharmacy and left messages saying that patient waned to pick up 2 more vials

## 2019-09-08 ENCOUNTER — Telehealth: Payer: Self-pay | Admitting: Cardiology

## 2019-09-08 NOTE — Telephone Encounter (Signed)
First episode of Atrial fibrillation, patient activated event. Patient will be called tomorrow to discuss. Adrian Prows, MD, [facility] 09/08/2019, 10:30 PM [place] [facility] Pager: [phone] Office: [phone]

## 2019-09-09 ENCOUNTER — Telehealth: Payer: Self-pay

## 2019-09-09 NOTE — Telephone Encounter (Signed)
Episode of Afib w/RVR at 7:34 AM ET 12/21, followed by spontaneous conversion with 3 sec pause at 4:03 AM ET 12/22. Episode perceived by patient. I discussed the diagnosis of Afib wih the patient. Given that this is first documented episode, we mutually decided to continue monitoring for two more weeks, to assess Afib burden. Continue metoprolol tartrate 12.5 mg bid for now. CHA2DS2VASc score is 2, annual stroke risk 2.2%. I recommended anticoagulation, perhaps with stopping of Aspirin. Patient does not want to start anticoagulation at this point. I recommended that we further discuss risks/benefits at upcoming office visit.   Nigel Mormon, MD [facility]

## 2019-10-06 ENCOUNTER — Encounter: Payer: Self-pay | Admitting: Cardiology

## 2019-10-06 ENCOUNTER — Other Ambulatory Visit: Payer: Self-pay

## 2019-10-06 ENCOUNTER — Ambulatory Visit (INDEPENDENT_AMBULATORY_CARE_PROVIDER_SITE_OTHER): Payer: 59 | Admitting: Cardiology

## 2019-10-06 VITALS — BP 155/88 | HR 74 | Temp 98.3°F | Resp 16 | Ht 67.0 in | Wt 199.0 lb

## 2019-10-06 DIAGNOSIS — I2510 Atherosclerotic heart disease of native coronary artery without angina pectoris: Secondary | ICD-10-CM

## 2019-10-06 DIAGNOSIS — I480 Paroxysmal atrial fibrillation: Secondary | ICD-10-CM | POA: Diagnosis not present

## 2019-10-06 MED ORDER — METOPROLOL TARTRATE 25 MG PO TABS: 25.0000 mg | ORAL_TABLET | Freq: Two times a day (BID) | ORAL | 2 refills | Status: DC

## 2019-10-06 NOTE — Progress Notes (Signed)
Follow up visit  Subjective:   Dwayne Larsen, male    DOB: [birth year], 65 y.o.   MRN: [id number]     HPI   Chief Complaint  Patient presents with  . Results    Event Monitor    64 y.o. Caucasian male with controlled hypertension, hyperlipidemia, CAD s/p STEMI and RCA PCI 2015, paroxysmal atrial fibrillation.  At patient's last visit with me, I recommended event monitor to evaluate for his episodes of palpitations.  Event monitor showed at least 7 episodes of atrial fibrillation with RVR with or without symptoms.  Fastest heart rate with A. fib was surprisingly fast at 208 bpm.  He had 3-second pauses postconversion from atrial fibrillation.   Current Outpatient Medications on File Prior to Visit  Medication Sig Dispense Refill  . acidophilus (RISAQUAD) CAPS capsule Take by mouth daily.    Marland Kitchen aspirin 81 MG tablet Take 81 mg by mouth daily.      Marland Kitchen atorvastatin (LIPITOR) 80 MG tablet TAKE 1 TABLET BY MOUTH DAILY AT 6 PM (Patient taking differently: Take 80 mg by mouth every evening. ) 30 tablet 0  . fenofibrate 160 MG tablet TAKE 1 TABLET BY MOUTH ONCE DAILY (Patient taking differently: Take 160 mg by mouth daily. ) 90 tablet 3  . metoprolol tartrate (LOPRESSOR) 25 MG tablet Take 0.5 tablets (12.5 mg total) by mouth 2 (two) times daily. (Patient taking differently: Take 12.5 mg by mouth 2 (two) times daily. ) 7 tablet 0  . Multiple Vitamin (MULTIVITAMIN WITH MINERALS) TABS tablet Take 1 tablet by mouth daily.    . naphazoline-pheniramine (NAPHCON-A) 0.025-0.3 % ophthalmic solution Place 1 drop into both eyes 4 (four) times daily as needed for eye irritation.    . nitroGLYCERIN (NITROSTAT) 0.4 MG SL tablet Place 1 tablet (0.4 mg total) under the tongue every 5 (five) minutes as needed for chest pain. 25 tablet 3  . oxymetazoline (AFRIN) 0.05 % nasal spray Place 1 spray into both nostrils daily.    . tamsulosin (FLOMAX) 0.4 MG CAPS capsule Take 0.4 mg by mouth daily.  6  . zolpidem  (AMBIEN) 10 MG tablet Take 10 mg by mouth at bedtime as needed for sleep.     No current facility-administered medications on file prior to visit.    Cardiovascular & other pertient studies:  Event monitor 08/26/2019 - 09/24/2019:  Diagnostic time: 99%  Dominant rhythm: Sinus.  HR 46-208 bpm. Avg HR 70 bpm.  7 episodes of Afib w/RVR, associated with and without symptoms of flutter or skipped beats.  Afib burden: 2% event monitor time.  One post Afib conversion pause of 3 sec.  Occasional PVC's.  No atrial atrial flutter/SVT/VT/high grade AV block, sinus pause >3sec noted.   EKG 08/25/2019: Sinus rhythm 54 bpm. Old inferior infarct.   Echocardiogram 2015: - Left ventricle: Average global LV strain is -17.3% The cavity  size was normal. There was mild focal basal hypertrophy of the  septum. Systolic function was mildly reduced. The estimated  ejection fraction was 50%. There is akinesis of the  basal-midinferior myocardium. There is akinesis of the  basalinferoseptal myocardium.  - Mitral valve: There was mild regurgitation.   Coronary angiography and intervention 2015 (Dr. Fletcher Anon): LM: Normal LAD: Prox diffuse 20% disease LCx: Mid 50% stenosis. Mild calcification RCA: Large, dominant. Mid 50% stenosis with distal occlusion with thrombus. Prox-mid PDA 60% stenosis.      Aspiration thrombectomy and PCI dRCA 3.0 X 18 mm Xience DES  Recent labs: 07/21/2019: Glucose 109, BUN/Cr 21/1.9. EGFR 36. Na/K 140/4.4. 13.6Rest of the CMP normal H/H 13.6/42.2. MCV 83.6. Platelets 164 Chol 123, TG 85, HDL 35, LDL 71 TSH normal  06/05/2018: Chol 133, TG 181, HDL 32, LDL 65  05/24/2019: Glucose 97, BUN/Cr 20/2.02. EGFR 34. Na/K 138/3.8. Rest of the CMP normal   Review of Systems  Cardiovascular: Positive for palpitations. Negative for chest pain, dyspnea on exertion, leg swelling and syncope.        Vitals:   10/06/19 1406  BP: (!) 155/88  Pulse: 74  Resp: 16    Temp: 98.3 F (36.8 C)  SpO2: 96%     Body mass index is 31.17 kg/m. Filed Weights   10/06/19 1406  Weight: 199 lb (90.3 kg)     Objective:   Physical Exam  Constitutional: He appears well-developed and well-nourished.  Neck: No JVD present.  Cardiovascular: Normal rate, regular rhythm, normal heart sounds and intact distal pulses.  No murmur heard. Pulmonary/Chest: Effort normal and breath sounds normal. He has no wheezes. He has no rales.  Musculoskeletal:        General: No edema.  Nursing note and vitals reviewed.       Assessment & Recommendations:   64 y.o. Caucasian male with controlled hypertension, hyperlipidemia, CAD s/p STEMI and RCA PCI 2015, paroxysmal atrial fibrillation.  Paroxysmal atrial fibrillation: Self-limiting episodes with RVR.  Patient has symptoms of palpitations with some of them, but denies any chest pain, shortness of breath.  Clinically euvolemic.  Given that he is minimally symptomatic, he is reluctant to try rhythm control therapy.  We discussed rate control therapy with increasing dose of metoprolol tartrate to 25 mg twice daily.  ACE inhibitor would be also a reasonable choice given his hypertension and its antifibrotic properties.  However, he would like to avoid starting any medication at this time.  Finally, we discussed risks benefits of anticoagulation given his CHA2DS2VASc score of 2, and annual stroke risk of 2.2%.  I explained to the patient that presence or absence of symptoms is unrelated to his risk of stroke based on his risk factors.  Knowing the risks and benefits, he will avoid anticoagulation at this time and would like to continue aspirin 81 mg daily for his coronary disease.  I also gave him the option of left atrial appendage closure, but he would not like to consider it at this time.  In terms of etiology evaluation for atrial fibrillation, he does not have any ischemic symptoms of the time.  Any residual of ischemia  testing.  He would also like to hold off sleep study evaluation at this time.  Coronary artery disease: No angina symptoms.  Continue aspirin, statin.  Increase metoprolol dose as above.   Nigel Mormon, MD [facility] Pager: [phone] Office: [phone]

## 2019-11-04 ENCOUNTER — Other Ambulatory Visit: Payer: Self-pay | Admitting: Urology

## 2019-12-08 ENCOUNTER — Ambulatory Visit: Payer: 59 | Attending: Internal Medicine

## 2019-12-08 DIAGNOSIS — Z20822 Contact with and (suspected) exposure to covid-19: Secondary | ICD-10-CM | POA: Insufficient documentation

## 2019-12-09 LAB — NOVEL CORONAVIRUS, NAA: SARS-CoV-2, NAA: NOT DETECTED

## 2019-12-09 LAB — SARS-COV-2, NAA 2 DAY TAT

## 2020-01-05 ENCOUNTER — Ambulatory Visit: Payer: 59 | Admitting: Cardiology

## 2020-01-07 ENCOUNTER — Encounter (HOSPITAL_BASED_OUTPATIENT_CLINIC_OR_DEPARTMENT_OTHER): Payer: Self-pay | Admitting: Urology

## 2020-01-07 ENCOUNTER — Other Ambulatory Visit: Payer: Self-pay

## 2020-01-07 NOTE — Progress Notes (Addendum)
Addendum: spoke with Dwayne zanetto pa ok to proceed.  Addendum: cardiac clearance note dr Arnell Sieving received and placed on chart for 01-13-2020 surgery, cardiac clearance to Dwayne zanetto pa for review.  Spoke with Shanda Bumps zanetto pa and patient needs cardiac clearance for 01-13-2020 surgery, left message with selita bradsher patient needs cardiac clearance for 01-13-2020 surgery  Spoke w/ via phone for pre-op interview---patient Lab needs dos---- I stat 8             COVID test ------01-09-2020 at 1425 Arrive at -------915 NPO after ------midnight Medications to take morning of surgery -----feniofibrate, metorpolol tartrate, tamsulosin Diabetic medication -----n/a Patient Special Instructions -----overnight stay instructions given, bring all prescription medications in original containers Pre-Op special Istructions -----none Patient verbalized understanding of instructions that were given at this phone interview. Patient denies shortness of breath, chest pain, fever, cough a this phone interview.  Anesthesia : cad,paf,  aspirin  PCP:dr Creola Corn Cardiologist :dr Shanon Ace, Theron Arista 10-06-2019 epic Chest x-ray :none Event monitor 18-04-2019 to 09-24-2019 mentioned in dr Shanon Ace note 10-06-2019 EKG :08-25-2019 epic Echo : 06-25-2014 epic Cardiac Cath : 2015 mentioned in dr Shanon Ace note 10-06-2019 epic Sleep Study/ CPAP :n/a Fasting Blood Sugar :      / Checks Blood Sugar -- times a day:  n/a Blood Thinner/ Instructions /Last Dose:n/a ASA / Instructions/ Last Dose :instructions to stay on 81 mg aspirin on chart per dr Ronne Binning   Patient denies shortness of breath, chest pain, fever, and cough at this phone interview.

## 2020-01-09 ENCOUNTER — Other Ambulatory Visit (HOSPITAL_COMMUNITY)
Admission: RE | Admit: 2020-01-09 | Discharge: 2020-01-09 | Disposition: A | Discharge status: Home / Self Care | Payer: 59 | Source: Ambulatory Visit | Attending: Urology | Admitting: Urology

## 2020-01-09 DIAGNOSIS — Z01812 Encounter for preprocedural laboratory examination: Secondary | ICD-10-CM | POA: Diagnosis present

## 2020-01-09 DIAGNOSIS — Z20822 Contact with and (suspected) exposure to covid-19: Secondary | ICD-10-CM | POA: Insufficient documentation

## 2020-01-10 LAB — SARS CORONAVIRUS 2 (TAT 6-24 HRS): SARS Coronavirus 2: NEGATIVE

## 2020-01-13 ENCOUNTER — Encounter (HOSPITAL_BASED_OUTPATIENT_CLINIC_OR_DEPARTMENT_OTHER)
Admission: RE | Disposition: A | Discharge status: Home / Self Care | Payer: Self-pay | Source: Home / Self Care | Attending: Urology

## 2020-01-13 ENCOUNTER — Encounter (HOSPITAL_BASED_OUTPATIENT_CLINIC_OR_DEPARTMENT_OTHER): Payer: Self-pay | Admitting: Urology

## 2020-01-13 ENCOUNTER — Ambulatory Visit (HOSPITAL_BASED_OUTPATIENT_CLINIC_OR_DEPARTMENT_OTHER): Payer: 59 | Admitting: Physician Assistant

## 2020-01-13 ENCOUNTER — Observation Stay (HOSPITAL_BASED_OUTPATIENT_CLINIC_OR_DEPARTMENT_OTHER)
Admission: RE | Admit: 2020-01-13 | Discharge: 2020-01-14 | Disposition: A | Discharge status: Home / Self Care | Payer: 59 | Attending: Urology | Admitting: Urology

## 2020-01-13 DIAGNOSIS — Z87891 Personal history of nicotine dependence: Secondary | ICD-10-CM | POA: Diagnosis not present

## 2020-01-13 DIAGNOSIS — I2510 Atherosclerotic heart disease of native coronary artery without angina pectoris: Secondary | ICD-10-CM | POA: Insufficient documentation

## 2020-01-13 DIAGNOSIS — Z955 Presence of coronary angioplasty implant and graft: Secondary | ICD-10-CM | POA: Insufficient documentation

## 2020-01-13 DIAGNOSIS — R338 Other retention of urine: Secondary | ICD-10-CM | POA: Diagnosis not present

## 2020-01-13 DIAGNOSIS — N210 Calculus in bladder: Secondary | ICD-10-CM | POA: Diagnosis not present

## 2020-01-13 DIAGNOSIS — Z8249 Family history of ischemic heart disease and other diseases of the circulatory system: Secondary | ICD-10-CM | POA: Diagnosis not present

## 2020-01-13 DIAGNOSIS — Z880 Allergy status to penicillin: Secondary | ICD-10-CM | POA: Diagnosis not present

## 2020-01-13 DIAGNOSIS — E7800 Pure hypercholesterolemia, unspecified: Secondary | ICD-10-CM | POA: Diagnosis not present

## 2020-01-13 DIAGNOSIS — N401 Enlarged prostate with lower urinary tract symptoms: Secondary | ICD-10-CM | POA: Diagnosis present

## 2020-01-13 DIAGNOSIS — I509 Heart failure, unspecified: Secondary | ICD-10-CM | POA: Diagnosis not present

## 2020-01-13 DIAGNOSIS — Z87442 Personal history of urinary calculi: Secondary | ICD-10-CM | POA: Insufficient documentation

## 2020-01-13 DIAGNOSIS — Z91018 Allergy to other foods: Secondary | ICD-10-CM | POA: Insufficient documentation

## 2020-01-13 DIAGNOSIS — N400 Enlarged prostate without lower urinary tract symptoms: Secondary | ICD-10-CM | POA: Diagnosis present

## 2020-01-13 DIAGNOSIS — M1990 Unspecified osteoarthritis, unspecified site: Secondary | ICD-10-CM | POA: Diagnosis not present

## 2020-01-13 HISTORY — PX: CYSTOSCOPY WITH LITHOLAPAXY: SHX1425

## 2020-01-13 HISTORY — PX: TRANSURETHRAL RESECTION OF PROSTATE: SHX73

## 2020-01-13 HISTORY — DX: Benign prostatic hyperplasia without lower urinary tract symptoms: N40.0

## 2020-01-13 HISTORY — DX: Other specified postprocedural states: Z98.890

## 2020-01-13 HISTORY — DX: Personal history of urinary calculi: Z87.442

## 2020-01-13 HISTORY — DX: Other specified postprocedural states: R11.2

## 2020-01-13 LAB — CBC
HCT: 41.1 % (ref 39.0–52.0)
Hemoglobin: 13.1 g/dL (ref 13.0–17.0)
MCH: 26.8 pg (ref 26.0–34.0)
MCHC: 31.9 g/dL (ref 30.0–36.0)
MCV: 84 fL (ref 80.0–100.0)
Platelets: 183 10*3/uL (ref 150–400)
RBC: 4.89 MIL/uL (ref 4.22–5.81)
RDW: 13.8 % (ref 11.5–15.5)
WBC: 7.9 10*3/uL (ref 4.0–10.5)
nRBC: 0 % (ref 0.0–0.2)

## 2020-01-13 LAB — POCT I-STAT, CHEM 8
BUN: 28 mg/dL — ABNORMAL HIGH (ref 8–23)
Calcium, Ion: 1.31 mmol/L (ref 1.15–1.40)
Chloride: 105 mmol/L (ref 98–111)
Creatinine, Ser: 1.9 mg/dL — ABNORMAL HIGH (ref 0.61–1.24)
Glucose, Bld: 114 mg/dL — ABNORMAL HIGH (ref 70–99)
HCT: 44 % (ref 39.0–52.0)
Hemoglobin: 15 g/dL (ref 13.0–17.0)
Potassium: 4.2 mmol/L (ref 3.5–5.1)
Sodium: 140 mmol/L (ref 135–145)
TCO2: 23 mmol/L (ref 22–32)

## 2020-01-13 LAB — BASIC METABOLIC PANEL
Anion gap: 8 (ref 5–15)
BUN: 29 mg/dL — ABNORMAL HIGH (ref 8–23)
CO2: 23 mmol/L (ref 22–32)
Calcium: 8.4 mg/dL — ABNORMAL LOW (ref 8.9–10.3)
Chloride: 108 mmol/L (ref 98–111)
Creatinine, Ser: 1.86 mg/dL — ABNORMAL HIGH (ref 0.61–1.24)
GFR calc Af Amer: 43 mL/min — ABNORMAL LOW (ref >60)
GFR calc non Af Amer: 37 mL/min — ABNORMAL LOW (ref >60)
Glucose, Bld: 147 mg/dL — ABNORMAL HIGH (ref 70–99)
Potassium: 4.4 mmol/L (ref 3.5–5.1)
Sodium: 139 mmol/L (ref 135–145)

## 2020-01-13 SURGERY — TURP (TRANSURETHRAL RESECTION OF PROSTATE)
Anesthesia: General | Site: Renal

## 2020-01-13 MED ORDER — SODIUM CHLORIDE 0.9 % IR SOLN
Status: DC | PRN
  Administered 2020-01-13 (×6): 3000 mL

## 2020-01-13 MED ORDER — ATORVASTATIN CALCIUM 80 MG PO TABS
80.0000 mg | ORAL_TABLET | Freq: Every evening | ORAL | Status: DC
  Administered 2020-01-13: 80 mg via ORAL
  Filled 2020-01-13: qty 1

## 2020-01-13 MED ORDER — SODIUM CHLORIDE 0.9 % IV SOLN: INTRAVENOUS | Status: DC

## 2020-01-13 MED ORDER — LIDOCAINE 2% (20 MG/ML) 5 ML SYRINGE
INTRAMUSCULAR | Status: AC
  Filled 2020-01-13: qty 5

## 2020-01-13 MED ORDER — BELLADONNA ALKALOIDS-OPIUM 16.2-30 MG RE SUPP
RECTAL | Status: AC
  Filled 2020-01-13: qty 1

## 2020-01-13 MED ORDER — SODIUM CHLORIDE 0.9 % IV SOLN
INTRAVENOUS | Status: AC
  Filled 2020-01-13: qty 100

## 2020-01-13 MED ORDER — ACETAMINOPHEN 325 MG PO TABS: 650.0000 mg | ORAL_TABLET | ORAL | Status: DC | PRN

## 2020-01-13 MED ORDER — ACETAMINOPHEN 10 MG/ML IV SOLN: 1000.0000 mg | Freq: Once | INTRAVENOUS | Status: DC | PRN

## 2020-01-13 MED ORDER — OXYCODONE-ACETAMINOPHEN 5-325 MG PO TABS: 1.0000 | ORAL_TABLET | ORAL | Status: DC | PRN

## 2020-01-13 MED ORDER — FENTANYL CITRATE (PF) 100 MCG/2ML IJ SOLN
INTRAMUSCULAR | Status: DC | PRN
  Administered 2020-01-13 (×4): 50 ug via INTRAVENOUS

## 2020-01-13 MED ORDER — METOPROLOL TARTRATE 25 MG PO TABS: 12.5000 mg | ORAL_TABLET | Freq: Two times a day (BID) | ORAL | Status: DC

## 2020-01-13 MED ORDER — ASPIRIN 81 MG PO CHEW: 81.0000 mg | CHEWABLE_TABLET | Freq: Every day | ORAL | Status: DC

## 2020-01-13 MED ORDER — PROPOFOL 10 MG/ML IV BOLUS
INTRAVENOUS | Status: DC | PRN
  Administered 2020-01-13: 150 mg via INTRAVENOUS

## 2020-01-13 MED ORDER — ACETAMINOPHEN 325 MG PO TABS: 325.0000 mg | ORAL_TABLET | ORAL | Status: DC | PRN

## 2020-01-13 MED ORDER — FENTANYL CITRATE (PF) 100 MCG/2ML IJ SOLN
25.0000 ug | INTRAMUSCULAR | Status: DC | PRN
  Administered 2020-01-13 (×4): 50 ug via INTRAVENOUS

## 2020-01-13 MED ORDER — FENOFIBRATE 160 MG PO TABS
160.0000 mg | ORAL_TABLET | Freq: Every day | ORAL | Status: DC
  Filled 2020-01-13: qty 1

## 2020-01-13 MED ORDER — DIPHENHYDRAMINE HCL 12.5 MG/5ML PO ELIX: 12.5000 mg | ORAL_SOLUTION | Freq: Four times a day (QID) | ORAL | Status: DC | PRN

## 2020-01-13 MED ORDER — DOCUSATE SODIUM 100 MG PO CAPS
100.0000 mg | ORAL_CAPSULE | Freq: Two times a day (BID) | ORAL | Status: DC
  Administered 2020-01-13: 100 mg via ORAL

## 2020-01-13 MED ORDER — FENTANYL CITRATE (PF) 100 MCG/2ML IJ SOLN
INTRAMUSCULAR | Status: AC
  Filled 2020-01-13: qty 2

## 2020-01-13 MED ORDER — SODIUM CHLORIDE 0.9 % IV SOLN
2.0000 g | INTRAVENOUS | Status: AC
  Administered 2020-01-13: 2 g via INTRAVENOUS

## 2020-01-13 MED ORDER — SODIUM CHLORIDE 0.9 % IR SOLN: 3000.0000 mL | Status: DC

## 2020-01-13 MED ORDER — LIDOCAINE 2% (20 MG/ML) 5 ML SYRINGE
INTRAMUSCULAR | Status: DC | PRN
  Administered 2020-01-13: 100 mg via INTRAVENOUS

## 2020-01-13 MED ORDER — DIPHENHYDRAMINE HCL 50 MG/ML IJ SOLN: 12.5000 mg | Freq: Four times a day (QID) | INTRAMUSCULAR | Status: DC | PRN

## 2020-01-13 MED ORDER — ONDANSETRON HCL 4 MG/2ML IJ SOLN
INTRAMUSCULAR | Status: AC
  Filled 2020-01-13: qty 2

## 2020-01-13 MED ORDER — ZOLPIDEM TARTRATE 5 MG PO TABS
5.0000 mg | ORAL_TABLET | Freq: Every evening | ORAL | Status: DC | PRN
  Administered 2020-01-14: 5 mg via ORAL

## 2020-01-13 MED ORDER — MEPERIDINE HCL 25 MG/ML IJ SOLN: 6.2500 mg | INTRAMUSCULAR | Status: DC | PRN

## 2020-01-13 MED ORDER — EPHEDRINE 5 MG/ML INJ
INTRAVENOUS | Status: AC
  Filled 2020-01-13: qty 10

## 2020-01-13 MED ORDER — DOCUSATE SODIUM 100 MG PO CAPS
ORAL_CAPSULE | ORAL | Status: AC
  Filled 2020-01-13: qty 1

## 2020-01-13 MED ORDER — DEXAMETHASONE SODIUM PHOSPHATE 10 MG/ML IJ SOLN
INTRAMUSCULAR | Status: DC | PRN
  Administered 2020-01-13 (×2): 5 mg via INTRAVENOUS

## 2020-01-13 MED ORDER — ONDANSETRON HCL 4 MG/2ML IJ SOLN: 4.0000 mg | INTRAMUSCULAR | Status: DC | PRN

## 2020-01-13 MED ORDER — OXYCODONE HCL 5 MG/5ML PO SOLN: 5.0000 mg | Freq: Once | ORAL | Status: DC | PRN

## 2020-01-13 MED ORDER — ONDANSETRON HCL 4 MG/2ML IJ SOLN: 4.0000 mg | Freq: Once | INTRAMUSCULAR | Status: DC | PRN

## 2020-01-13 MED ORDER — EPHEDRINE SULFATE-NACL 50-0.9 MG/10ML-% IV SOSY
PREFILLED_SYRINGE | INTRAVENOUS | Status: DC | PRN
  Administered 2020-01-13 (×2): 10 mg via INTRAVENOUS

## 2020-01-13 MED ORDER — ONDANSETRON HCL 4 MG/2ML IJ SOLN
INTRAMUSCULAR | Status: DC | PRN
  Administered 2020-01-13: 4 mg via INTRAVENOUS

## 2020-01-13 MED ORDER — DEXAMETHASONE SODIUM PHOSPHATE 10 MG/ML IJ SOLN
INTRAMUSCULAR | Status: AC
  Filled 2020-01-13: qty 1

## 2020-01-13 MED ORDER — CEFTRIAXONE SODIUM 2 G IJ SOLR
INTRAMUSCULAR | Status: AC
  Filled 2020-01-13: qty 20

## 2020-01-13 MED ORDER — HYDROMORPHONE HCL 1 MG/ML IJ SOLN: 0.5000 mg | INTRAMUSCULAR | Status: DC | PRN

## 2020-01-13 MED ORDER — BELLADONNA ALKALOIDS-OPIUM 16.2-60 MG RE SUPP
1.0000 | Freq: Four times a day (QID) | RECTAL | Status: DC | PRN
  Administered 2020-01-13: 1 via RECTAL

## 2020-01-13 MED ORDER — MIDAZOLAM HCL 2 MG/2ML IJ SOLN
INTRAMUSCULAR | Status: AC
  Filled 2020-01-13: qty 2

## 2020-01-13 MED ORDER — OXYCODONE HCL 5 MG PO TABS: 5.0000 mg | ORAL_TABLET | Freq: Once | ORAL | Status: DC | PRN

## 2020-01-13 MED ORDER — ACETAMINOPHEN 160 MG/5ML PO SOLN: 325.0000 mg | ORAL | Status: DC | PRN

## 2020-01-13 MED ORDER — WHITE PETROLATUM EX OINT
TOPICAL_OINTMENT | CUTANEOUS | Status: AC
  Filled 2020-01-13: qty 5

## 2020-01-13 MED ORDER — MIDAZOLAM HCL 2 MG/2ML IJ SOLN
INTRAMUSCULAR | Status: DC | PRN
  Administered 2020-01-13: 2 mg via INTRAVENOUS

## 2020-01-13 SURGICAL SUPPLY — 31 items
BAG DRAIN URO-CYSTO SKYTR STRL (DRAIN) ×3 IMPLANT
BAG URINE DRAIN 2000ML AR STRL (UROLOGICAL SUPPLIES) ×3 IMPLANT
BAG URINE LEG 500ML (DRAIN) IMPLANT
CATH FOLEY 3WAY 30CC 22F (CATHETERS) ×3 IMPLANT
CLOTH BEACON ORANGE TIMEOUT ST (SAFETY) ×3 IMPLANT
ELECT REM PT RETURN 9FT ADLT (ELECTROSURGICAL)
ELECTRODE REM PT RTRN 9FT ADLT (ELECTROSURGICAL) IMPLANT
EVACUATOR MICROVAS BLADDER (UROLOGICAL SUPPLIES) IMPLANT
FIBER LASER FLEXIVA 1000 (UROLOGICAL SUPPLIES) IMPLANT
FIBER LASER FLEXIVA 365 (UROLOGICAL SUPPLIES) IMPLANT
FIBER LASER FLEXIVA 550 (UROLOGICAL SUPPLIES) IMPLANT
FIBER LASER TRAC TIP (UROLOGICAL SUPPLIES) IMPLANT
GLOVE BIO SURGEON STRL SZ8 (GLOVE) ×3 IMPLANT
GLOVE BIOGEL PI IND STRL 8.5 (GLOVE) ×4 IMPLANT
GLOVE BIOGEL PI INDICATOR 8.5 (GLOVE) ×2
GOWN STRL REUS W/TWL XL LVL3 (GOWN DISPOSABLE) ×6 IMPLANT
HOLDER FOLEY CATH W/STRAP (MISCELLANEOUS) ×3 IMPLANT
IV NS IRRIG 3000ML ARTHROMATIC (IV SOLUTION) ×18 IMPLANT
KIT TURNOVER CYSTO (KITS) ×3 IMPLANT
LOOP CUT BIPOLAR 24F LRG (ELECTROSURGICAL) ×3 IMPLANT
MANIFOLD NEPTUNE II (INSTRUMENTS) ×3 IMPLANT
NS IRRIG 500ML POUR BTL (IV SOLUTION) ×3 IMPLANT
PLUG CATH AND CAP STER (CATHETERS) ×3 IMPLANT
SYR 10ML LL (SYRINGE) ×3 IMPLANT
SYR 30ML LL (SYRINGE) ×3 IMPLANT
SYR TOOMEY IRRIG 70ML (MISCELLANEOUS) ×3
SYRINGE TOOMEY IRRIG 70ML (MISCELLANEOUS) ×2 IMPLANT
TRAY CYSTO PACK (CUSTOM PROCEDURE TRAY) ×3 IMPLANT
TUBE CONNECTING 12X1/4 (SUCTIONS) IMPLANT
TUBING UROLOGY SET (TUBING) ×3 IMPLANT
WATER STERILE IRR 3000ML UROMA (IV SOLUTION) IMPLANT

## 2020-01-13 NOTE — H&P (Signed)
Urology Admission H&P  Chief Complaint: urinary retention  History of Present Illness: Dwayne Larsen is a 65yo with a hx of BPH and urinary retention here for TURP. He has a known bladder calculus. He has severe LUTS on medical therapy. No recent hematuria. No fevers/chills/sweats  Past Medical History:  Diagnosis Date  . Abdominal pain, left lower quadrant   . Allergy, unspecified not elsewhere classified   . BPH (benign prostatic hyperplasia)   . CAD (coronary artery disease) March 07, 2014   3.0 x 18 mm Xience drug-eluting stent. The stent was postdilated with a 3.25 noncompliant balloon.  . DJD (degenerative joint disease)   . Elevated prostate specific antigen (PSA)   . Gout   . History of kidney stones   . Hypercholesteremia   . Lumbar back pain   . Palpitations   . PONV (postoperative nausea and vomiting)    nausea   Past Surgical History:  Procedure Laterality Date  . KNEE ARTHROSCOPY     right  . LAMINECTOMY AND MICRODISCECTOMY LUMBAR SPINE  2003   L4-5  . LEFT HEART CATHETERIZATION WITH CORONARY ANGIOGRAM N/A 03/07/2014   Procedure: LEFT HEART CATHETERIZATION WITH CORONARY ANGIOGRAM;  Surgeon: Iran Ouch, MD;  Location: [facility] CATH LAB;  Service: Cardiovascular;  Laterality: N/A;  . PERCUTANEOUS CORONARY STENT INTERVENTION (PCI-S)  03/07/2014   Procedure: PERCUTANEOUS CORONARY STENT INTERVENTION (PCI-S);  Surgeon: Iran Ouch, MD;  Location: [facility] CATH LAB;  Service: Cardiovascular;;  . RECTAL SURGERY  2003   Dr. Orson Slick    Home Medications:  Current Facility-Administered Medications  Medication Dose Route Frequency Provider Last Rate Last Admin  . 0.9 %  sodium chloride infusion   Intravenous Continuous Lannie Fields, DO 50 mL/hr at 01/13/20 0949 New Bag at 01/13/20 0949  . cefTRIAXone (ROCEPHIN) 2 g in sodium chloride 0.9 % 100 mL IVPB  2 g Intravenous 30 min Pre-Op Grizel Vesely, Mardene Celeste, MD       Allergies:  Allergies  Allergen Reactions  . Other Itching  and Rash    Walnuts and pecans  . Penicillins Other (See Comments)    Did it involve swelling of the face/tongue/throat, SOB, or low BP?No Did it involve sudden or severe rash/hives, skin peeling, or any reaction on the inside of your mouth or nose? yes Did you need to seek medical attention at a hospital or doctor's office? no When did it last happen?64yrs old If all above answers are "NO", may proceed with cephalosporin use.     Family History  Problem Relation Age of Onset  . Coronary artery disease Father   . Hyperlipidemia Father   . Deep vein thrombosis Father   . Prostate cancer Father   . Diabetes Mother   . Diverticulitis Mother    Social History:  reports that he quit smoking about 39 years ago. His smoking use included cigarettes. He has a 10.00 pack-year smoking history. He has never used smokeless tobacco. He reports previous alcohol use. He reports that he does not use drugs.  Review of Systems  Genitourinary: Positive for difficulty urinating.  All other systems reviewed and are negative.   Physical Exam:  Vital signs in last 24 hours: Temp:  [97.8 F (36.6 C)] 97.8 F (36.6 C) (04/27 0944) Pulse Rate:  [71] 71 (04/27 0944) Resp:  [15] 15 (04/27 0944) BP: (142)/(85) 142/85 (04/27 0944) SpO2:  [99 %] 99 % (04/27 0944) Weight:  [88.4 kg] 88.4 kg (04/27 0944) Physical Exam  Constitutional:  He is oriented to person, place, and time. He appears well-developed and well-nourished.  HENT:  Head: Normocephalic and atraumatic.  Eyes: Pupils are equal, round, and reactive to light. EOM are normal.  Neck: No thyromegaly present.  Cardiovascular: Normal rate and regular rhythm.  Respiratory: Effort normal. No respiratory distress.  GI: Soft. He exhibits no distension.  Musculoskeletal:        General: No edema. Normal range of motion.     Cervical back: Normal range of motion.  Neurological: He is alert and oriented to person, place, and time.  Skin: Skin is  warm and dry.  Psychiatric: He has a normal mood and affect. His behavior is normal. Judgment and thought content normal.    Laboratory Data:  Results for orders placed or performed during the hospital encounter of 01/13/20 (from the past 24 hour(s))  I-STAT, chem 8     Status: Abnormal   Collection Time: 01/13/20  9:42 AM  Result Value Ref Range   Sodium 140 135 - 145 mmol/L   Potassium 4.2 3.5 - 5.1 mmol/L   Chloride 105 98 - 111 mmol/L   BUN 28 (H) 8 - 23 mg/dL   Creatinine, Ser 1.90 (H) 0.61 - 1.24 mg/dL   Glucose, Bld 114 (H) 70 - 99 mg/dL   Calcium, Ion 1.31 1.15 - 1.40 mmol/L   TCO2 23 22 - 32 mmol/L   Hemoglobin 15.0 13.0 - 17.0 g/dL   HCT 44.0 39.0 - 52.0 %   Recent Results (from the past 240 hour(s))  SARS CORONAVIRUS 2 (TAT 6-24 HRS) Nasopharyngeal Nasopharyngeal Swab     Status: None   Collection Time: 01/09/20  2:21 PM   Specimen: Nasopharyngeal Swab  Result Value Ref Range Status   SARS Coronavirus 2 NEGATIVE NEGATIVE Final    Comment: (NOTE) SARS-CoV-2 target nucleic acids are NOT DETECTED. The SARS-CoV-2 RNA is generally detectable in upper and lower respiratory specimens during the acute phase of infection. Negative results do not preclude SARS-CoV-2 infection, do not rule out co-infections with other pathogens, and should not be used as the sole basis for treatment or other patient management decisions. Negative results must be combined with clinical observations, patient history, and epidemiological information. The expected result is Negative. Fact Sheet for Patients: SugarRoll.be Fact Sheet for Healthcare Providers: https://www.woods-mathews.com/ This test is not yet approved or cleared by the [place] FDA and  has been authorized for detection and/or diagnosis of SARS-CoV-2 by FDA under an Emergency Use Authorization (EUA). This EUA will remain  in effect (meaning this test can be used) for the duration of  the COVID-19 declaration under Section 56 4(b)(1) of the Act, 21 U.S.C. section 360bbb-3(b)(1), unless the authorization is terminated or revoked sooner. Performed at [facility], [address] [place], [place] [address]    Creatinine: Recent Labs    01/13/20 7893  CREATININE 1.90*   Baseline Creatinine: 1.9  Impression/Assessment:  64yo with BPH with Urinary retention and bladder calculus  Plan:  The risks/benefits/alterantives to TURP and cystolithalopaxy was explained to the patient and he understands and wishes to proceed with surgery  Nicolette Bang 01/13/2020, 11:29 AM

## 2020-01-13 NOTE — Op Note (Signed)
Preoperative diagnosis: BPH with urinary retention, bladder calculi  Postoperative diagnosis: same  Procedure: 1 cystoscopy 2. Transurethral resection of the prostate 3. Cystolithalopaxy for a stone less than 2.5cm  Attending: Wilkie Aye  Anesthesia: General  Estimated blood loss: Minimal  Drains: 22 French foley  Specimens: 1. Prostate Chips 2. Bladder calculi  Antibiotics: Rocephin  Findings: Trilobar prostate enlargement. 4 bladder calculi 18mm-1cm Ureteral orifices in normal anatomic location.   Indications: Patient is a 64 year old male with a history of BPH and urinary retention with bladder calculi.  After discussing treatment options, they decided proceed with transurethral resection of the prostate and cystolithalopaxy.  Procedure her in detail: The patient was brought to the operating room and a brief timeout was done to ensure correct patient, correct procedure, correct site.  General anesthesia was administered patient was placed in dorsal lithotomy position.  Their genitalia was then prepped and draped in usual sterile fashion.  A rigid 22 French cystoscope was passed in the urethra and the bladder.  Bladder was inspected and we noted no masses or lesions.  the ureteral orifices were in the normal orthotopic locations. Using the sheath the calculi were removed and sent for compositional analysis. removed the cystoscope and placed a resectoscope into the bladder. We then turned our attention to the prostate resection. Using the bipolar resectoscope we resected the median lobe first from the bladder neck to the verumontanum. We then started at the 12 oclock position on the left lobe and resection to the 6 o'clock position from the bladder neck to the verumontanum. We then did the same resection of the right lobe. Once the resection was complete we then cauterized individual bleeders. We then removed the prostate chips and sent them for pathology.  We then re-inspected the  prostatic fossa and found no residual bleeding.  the bladder was then drained, a 22 French foley was placed and this concluded the procedure which was well tolerated by patient.  Complications: None  Condition: Stable, extubated, transferred to PACU  Plan: Patient is admitted overnight with continuous bladder irrigation. If their urine is clear tomorrow they will be discharged home and followup in 5 days for foley catheter removal and pathology discussion.

## 2020-01-13 NOTE — Anesthesia Postprocedure Evaluation (Signed)
Anesthesia Post Note  Patient: JASMINE MACEACHERN  Procedure(s) Performed: TRANSURETHRAL RESECTION OF THE PROSTATE (TURP) (N/A Renal) CYSTOSCOPY WITH LITHOLAPAXY (N/A Renal)     Patient location during evaluation: PACU Anesthesia Type: General Level of consciousness: sedated Pain management: pain level controlled Vital Signs Assessment: post-procedure vital signs reviewed and stable Respiratory status: spontaneous breathing Cardiovascular status: stable Postop Assessment: no apparent nausea or vomiting Anesthetic complications: no    Last Vitals:  Vitals:   01/13/20 1430 01/13/20 1456  BP: 110/88 135/76  Pulse: 70 63  Resp: 10 12  Temp:  36.6 C  SpO2: 95% 97%    Last Pain:  Vitals:   01/13/20 1456  TempSrc:   PainSc: 0-No pain   Pain Goal: Patients Stated Pain Goal: 8 (01/13/20 0944)                 Caren Macadam

## 2020-01-13 NOTE — Progress Notes (Signed)
Foley cath not draining. Writhing on stretcher after multiple doses of pain meds. Foley irrigated x3 for total of 120 ml. Unable to aspirate, no urine draining from cath. Dr. Ronne Binning called. Bladder area firm to palpation. Dr. Ronne Binning at bedside to irrigate cath without results. Foley changed to coude cath with 60 ml balloon by Dr. Ronne Binning.Marland Kitchen CBI reconnected with good flow and urine draining easily. Feeling better.

## 2020-01-13 NOTE — Progress Notes (Signed)
Patient arrived from PACU with c/o extreme bladder pressure. Refused analgesics, CBI running at slow to moderate rate with cherry urine noted in foley catheter. Upon moving to bed from stretcher, patient became extremely distressed, moaning, c/o "unbearable pressure to bladder". Immediately hand irrigated with NS per MD order and aspirated large blood clot. Dr. Ronne Binning notified and came to room, also hand irrigated and removed blood clots. Patient had immediate relief, B & O suppository given. CBI rate increased, clear/pink urine noted in foley catheter. Dr. Ronne Binning placed foley to traction. Patient continued to refuse analgesics. Will continue to monitor.

## 2020-01-13 NOTE — Transfer of Care (Signed)
Immediate Anesthesia Transfer of Care Note  Patient: Dwayne Larsen  Procedure(s) Performed: Procedure(s) (LRB): TRANSURETHRAL RESECTION OF THE PROSTATE (TURP) (N/A) CYSTOSCOPY WITH LITHOLAPAXY (N/A)  Patient Location: PACU  Anesthesia Type: General  Level of Consciousness: awake, oriented, sedated and patient cooperative  Airway & Oxygen Therapy: Patient Spontanous Breathing and Patient connected to face mask oxygen  Post-op Assessment: Report given to PACU RN and Post -op Vital signs reviewed and stable  Post vital signs: Reviewed and stable  Complications: No apparent anesthesia complications Last Vitals:  Vitals Value Taken Time  BP 186/87 01/13/20 1300  Temp    Pulse 93 01/13/20 1301  Resp 11 01/13/20 1301  SpO2 97 % 01/13/20 1301  Vitals shown include unvalidated device data.  Last Pain:  Vitals:   01/13/20 0944  TempSrc: Oral  PainSc: 0-No pain      Patients Stated Pain Goal: 8 (01/13/20 0944)

## 2020-01-13 NOTE — Anesthesia Preprocedure Evaluation (Signed)
Anesthesia Evaluation  Patient identified by MRN, date of birth, ID band Patient awake    Reviewed: Allergy & Precautions, NPO status , Patient's Chart, lab work & pertinent test results  History of Anesthesia Complications (+) PONV and history of anesthetic complications  Airway Mallampati: I       Dental no notable dental hx. (+) Teeth Intact   Pulmonary former smoker,    Pulmonary exam normal breath sounds clear to auscultation       Cardiovascular + CAD, + Cardiac Stents and +CHF  Normal cardiovascular exam+ dysrhythmias Atrial Fibrillation  Rhythm:Regular Rate:Normal     Neuro/Psych negative neurological ROS  negative psych ROS   GI/Hepatic   Endo/Other  negative endocrine ROS  Renal/GU Renal InsufficiencyRenal disease     Musculoskeletal   Abdominal Normal abdominal exam  (+)   Peds  Hematology negative hematology ROS (+)   Anesthesia Other Findings   Reproductive/Obstetrics                             Anesthesia Physical Anesthesia Plan  ASA: II  Anesthesia Plan: General   Post-op Pain Management:    Induction: Intravenous  PONV Risk Score and Plan: 3 and Ondansetron and Dexamethasone  Airway Management Planned: LMA  Additional Equipment: None  Intra-op Plan:   Post-operative Plan: Extubation in OR  Informed Consent: I have reviewed the patients History and Physical, chart, labs and discussed the procedure including the risks, benefits and alternatives for the proposed anesthesia with the patient or authorized representative who has indicated his/her understanding and acceptance.     Dental advisory given  Plan Discussed with: CRNA  Anesthesia Plan Comments:         Anesthesia Quick Evaluation

## 2020-01-13 NOTE — Anesthesia Procedure Notes (Signed)
Procedure Name: LMA Insertion Date/Time: 01/13/2020 11:42 AM Performed by: Francie Massing, CRNA Pre-anesthesia Checklist: Patient identified, Emergency Drugs available, Suction available and Patient being monitored Patient Re-evaluated:Patient Re-evaluated prior to induction Oxygen Delivery Method: Circle system utilized Preoxygenation: Pre-oxygenation with 100% oxygen Induction Type: IV induction Ventilation: Mask ventilation without difficulty LMA: LMA inserted LMA Size: 4.0 Number of attempts: 1 Airway Equipment and Method: Bite block Placement Confirmation: positive ETCO2 Tube secured with: Tape Dental Injury: Teeth and Oropharynx as per pre-operative assessment

## 2020-01-14 DIAGNOSIS — N401 Enlarged prostate with lower urinary tract symptoms: Secondary | ICD-10-CM | POA: Diagnosis not present

## 2020-01-14 LAB — BASIC METABOLIC PANEL
Anion gap: 8 (ref 5–15)
BUN: 26 mg/dL — ABNORMAL HIGH (ref 8–23)
CO2: 23 mmol/L (ref 22–32)
Calcium: 8.5 mg/dL — ABNORMAL LOW (ref 8.9–10.3)
Chloride: 105 mmol/L (ref 98–111)
Creatinine, Ser: 1.7 mg/dL — ABNORMAL HIGH (ref 0.61–1.24)
GFR calc Af Amer: 48 mL/min — ABNORMAL LOW (ref >60)
GFR calc non Af Amer: 42 mL/min — ABNORMAL LOW (ref >60)
Glucose, Bld: 175 mg/dL — ABNORMAL HIGH (ref 70–99)
Potassium: 4.5 mmol/L (ref 3.5–5.1)
Sodium: 136 mmol/L (ref 135–145)

## 2020-01-14 LAB — CBC
HCT: 38.2 % — ABNORMAL LOW (ref 39.0–52.0)
Hemoglobin: 12.3 g/dL — ABNORMAL LOW (ref 13.0–17.0)
MCH: 27 pg (ref 26.0–34.0)
MCHC: 32.2 g/dL (ref 30.0–36.0)
MCV: 83.8 fL (ref 80.0–100.0)
Platelets: 191 10*3/uL (ref 150–400)
RBC: 4.56 MIL/uL (ref 4.22–5.81)
RDW: 13.7 % (ref 11.5–15.5)
WBC: 12 10*3/uL — ABNORMAL HIGH (ref 4.0–10.5)
nRBC: 0 % (ref 0.0–0.2)

## 2020-01-14 MED ORDER — ZOLPIDEM TARTRATE 5 MG PO TABS
ORAL_TABLET | ORAL | Status: AC
  Filled 2020-01-14: qty 1

## 2020-01-14 MED ORDER — OXYCODONE-ACETAMINOPHEN 5-325 MG PO TABS: 1.0000 | ORAL_TABLET | ORAL | 0 refills | Status: DC | PRN

## 2020-01-14 NOTE — Discharge Instructions (Signed)
Indwelling Urinary Catheter Care, Adult An indwelling urinary catheter is a thin tube that is put into your bladder. The tube helps to drain pee (urine) out of your body. The tube goes in through your urethra. Your urethra is where pee comes out of your body. Your pee will come out through the catheter, then it will go into a bag (drainage bag). Take good care of your catheter so it will work well. How to wear your catheter and bag Supplies needed  Sticky tape (adhesive tape) or a leg strap.  Alcohol wipe or soap and water (if you use tape).  A clean towel (if you use tape).  Large overnight bag.  Smaller bag (leg bag). Wearing your catheter Attach your catheter to your leg with tape or a leg strap.  Make sure the catheter is not pulled tight.  If a leg strap gets wet, take it off and put on a dry strap.  If you use tape to hold the bag on your leg: 1. Use an alcohol wipe or soap and water to wash your skin where the tape made it sticky before. 2. Use a clean towel to pat-dry that skin. 3. Use new tape to make the bag stay on your leg. Wearing your bags You should have been given a large overnight bag.  You may wear the overnight bag in the day or night.  Always have the overnight bag lower than your bladder.  Do not let the bag touch the floor.  Before you go to sleep, put a clean plastic bag in a wastebasket. Then hang the overnight bag inside the wastebasket. You should also have a smaller leg bag that fits under your clothes.  Always wear the leg bag below your knee.  Do not wear your leg bag at night. How to care for your skin and catheter Supplies needed  A clean washcloth.  Water and mild soap.  A clean towel. Caring for your skin and catheter      Clean the skin around your catheter every day: 1. Wash your hands with soap and water. 2. Wet a clean washcloth in warm water and mild soap. 3. Clean the skin around your urethra.  If you are  male:  Gently spread the folds of skin around your vagina (labia).  With the washcloth in your other hand, wipe the inner side of your labia on each side. Wipe from front to back.  If you are male:  Pull back any skin that covers the end of your penis (foreskin).  With the washcloth in your other hand, wipe your penis in small circles. Start wiping at the tip of your penis, then move away from the catheter.  Move the foreskin back in place, if needed. 4. With your free hand, hold the catheter close to where it goes into your body.  Keep holding the catheter during cleaning so it does not get pulled out. 5. With the washcloth in your other hand, clean the catheter.  Only wipe downward on the catheter.  Do not wipe upward toward your body. Doing this may push germs into your urethra and cause infection. 6. Use a clean towel to pat-dry the catheter and the skin around it. Make sure to wipe off all soap. 7. Wash your hands with soap and water.  Shower every day. Do not take baths.  Do not use cream, ointment, or lotion on the area where the catheter goes into your body, unless your doctor tells you   to.  Do not use powders, sprays, or lotions on your genital area.  Check your skin around the catheter every day for signs of infection. Check for: ? Redness, swelling, or pain. ? Fluid or blood. ? Warmth. ? Pus or a bad smell. How to empty the bag Supplies needed  Rubbing alcohol.  Gauze pad or cotton ball.  Tape or a leg strap. Emptying the bag Pour the pee out of your bag when it is ?- full, or at least 2-3 times a day. Do this for your overnight bag and your leg bag. 1. Wash your hands with soap and water. 2. Separate (detach) the bag from your leg. 3. Hold the bag over the toilet or a clean pail. Keep the bag lower than your hips and bladder. This is so the pee (urine) does not go back into the tube. 4. Open the pour spout. It is at the bottom of the bag. 5. Empty the  pee into the toilet or pail. Do not let the pour spout touch any surface. 6. Put rubbing alcohol on a gauze pad or cotton ball. 7. Use the gauze pad or cotton ball to clean the pour spout. 8. Close the pour spout. 9. Attach the bag to your leg with tape or a leg strap. 10. Wash your hands with soap and water. Follow instructions for cleaning the drainage bag:  From the product maker.  As told by your doctor. How to change the bag Supplies needed  Alcohol wipes.  A clean bag.  Tape or a leg strap. Changing the bag Replace your bag when it starts to leak, smell bad, or look dirty. 1. Wash your hands with soap and water. 2. Separate the dirty bag from your leg. 3. Pinch the catheter with your fingers so that pee does not spill out. 4. Separate the catheter tube from the bag tube where these tubes connect (at the connection valve). Do not let the tubes touch any surface. 5. Clean the end of the catheter tube with an alcohol wipe. Use a different alcohol wipe to clean the end of the bag tube. 6. Connect the catheter tube to the tube of the clean bag. 7. Attach the clean bag to your leg with tape or a leg strap. Do not make the bag tight on your leg. 8. Wash your hands with soap and water. General rules   Never pull on your catheter. Never try to take it out. Doing that can hurt you.  Always wash your hands before and after you touch your catheter or bag. Use a mild, fragrance-free soap. If you do not have soap and water, use hand sanitizer.  Always make sure there are no twists or bends (kinks) in the catheter tube.  Always make sure there are no leaks in the catheter or bag.  Drink enough fluid to keep your pee pale yellow.  Do not take baths, swim, or use a hot tub.  If you are male, wipe from front to back after you poop (have a bowel movement). Contact a doctor if:  Your pee is cloudy.  Your pee smells worse than usual.  Your catheter gets clogged.  Your catheter  leaks.  Your bladder feels full. Get help right away if:  You have redness, swelling, or pain where the catheter goes into your body.  You have fluid, blood, pus, or a bad smell coming from the area where the catheter goes into your body.  Your skin feels warm where   the catheter goes into your body.  You have a fever.  You have pain in your: ? Belly (abdomen). ? Legs. ? Lower back. ? Bladder.  You see blood in the catheter.  Your pee is pink or red.  You feel sick to your stomach (nauseous).  You throw up (vomit).  You have chills.  Your pee is not draining into the bag.  Your catheter gets pulled out. Summary  An indwelling urinary catheter is a thin tube that is placed into the bladder to help drain pee (urine) out of the body.  The catheter is placed into the part of the body that drains pee from the bladder (urethra).  Taking good care of your catheter will keep it working properly and help prevent problems.  Always wash your hands before and after touching your catheter or bag.  Never pull on your catheter or try to take it out. This information is not intended to replace advice given to you by your health care provider. Make sure you discuss any questions you have with your health care provider. Document Revised: 12/27/2018 Document Reviewed: 04/20/2017 Elsevier Patient Education  2020 [employer] Transurethral Resection of the Prostate, Care After This sheet gives you information about how to care for yourself after your procedure. Your health care provider may also give you more specific instructions. If you have problems or questions, contact your health care provider. What can I expect after the procedure? After the procedure, it is common to have:  Mild pain in your lower abdomen.  Soreness or mild discomfort in your penis from having the catheter inserted during the procedure.  A feeling of urgency when you need to urinate.  A small amount of  blood in your urine. You may notice some small blood clots in your urine. These are normal. Follow these instructions at home: Medicines  Take over-the-counter and prescription medicines only as told by your health care provider.  If you were prescribed an antibiotic medicine, take it as told by your health care provider. Do not stop taking the antibiotic even if you start to feel better.  Ask your health care provider if the medicine prescribed to you: ? Requires you to avoid driving or using heavy machinery. ? Can cause constipation. You may need to take actions to prevent or treat constipation, such as:  Take over-the-counter or prescription medicines.  Eat foods that are high in fiber, such as fresh fruits and vegetables, whole grains, and beans.  Limit foods that are high in fat and processed sugars, such as fried or sweet foods.  Do not drive for 24 hours if you were given a sedative during your procedure. Activity   Return to your normal activities as told by your health care provider. Ask your health care provider what activities are safe for you.  Do not lift anything that is heavier than 10 lb (4.5 kg), or the limit that you are told, for 3 weeks after the procedure or until your health care provider says that it is safe.  Avoid intense physical activity for as long as told by your health care provider.  Avoid sitting for a long time without moving. Get up and move around one or more times every few hours. This helps to prevent blood clots. You may increase your physical activity gradually as you start to feel better. Lifestyle  Do not drink alcohol for as long as told by your health care provider. This is especially important if you are   taking prescription pain medicines.  Do not engage in sexual activity until your health care provider says that you can do this. General instructions   Do not take baths, swim, or use a hot tub until your health care provider  approves.  Drink enough fluid to keep your urine pale yellow.  Urinate as soon as you feel the need to. Do not try to hold your urine for long periods of time.  If your health care provider approves, you may take a stool softener for 2-3 weeks to prevent you from straining to have a bowel movement.  Wear compression stockings as told by your health care provider. These stockings help to prevent blood clots and reduce swelling in your legs.  Keep all follow-up visits as told by your health care provider. This is important. Contact a health care provider if you have:  Difficulty urinating.  A fever.  Pain that gets worse or does not improve with medicine.  Blood in your urine that does not go away after 1 week of resting and drinking more fluids.  Swelling in your penis or testicles. Get help right away if:  You are unable to urinate.  You are having more blood clots in your urine instead of fewer.  You have: ? Large blood clots. ? A lot of blood in your urine. ? Pain in your back or lower abdomen. ? Pain or swelling in your legs. ? Chills and you are shaking. ? Difficulty breathing or shortness of breath. Summary  After the procedure, it is common to have a small amount of blood in your urine.  Avoid heavy lifting and intense physical activity for as long as told by your health care provider.  Urinate as soon as you feel the need to. Do not try to hold your urine for long periods of time.  Keep all follow-up visits as told by your health care provider. This is important. This information is not intended to replace advice given to you by your health care provider. Make sure you discuss any questions you have with your health care provider. Document Revised: 12/25/2018 Document Reviewed: 06/05/2018 Elsevier Patient Education  2020 [employer]  

## 2020-01-18 LAB — SURGICAL PATHOLOGY

## 2020-01-19 NOTE — Discharge Summary (Signed)
Physician Discharge Summary  Patient ID: Dwayne Larsen MRN: [id number] DOB/AGE: [id number] 64 y.o.  Admit date: 01/13/2020 Discharge date: 01/14/2020  Admission Diagnoses:  BPH  Discharge Diagnoses:  Active Problems:   BPH (benign prostatic hyperplasia)   Past Medical History:  Diagnosis Date  . Abdominal pain, left lower quadrant   . Allergy, unspecified not elsewhere classified   . BPH (benign prostatic hyperplasia)   . CAD (coronary artery disease) March 07, 2014   3.0 x 18 mm Xience drug-eluting stent. The stent was postdilated with a 3.25 noncompliant balloon.  . DJD (degenerative joint disease)   . Elevated prostate specific antigen (PSA)   . Gout   . History of kidney stones   . Hypercholesteremia   . Lumbar back pain   . Palpitations   . PONV (postoperative nausea and vomiting)    nausea    Surgeries: Procedure(s): TRANSURETHRAL RESECTION OF THE PROSTATE (TURP) CYSTOSCOPY WITH LITHOLAPAXY on 01/13/2020   Consultants (if any):   Discharged Condition: Improved  Hospital Course: Dwayne Larsen is an 64 y.o. male who was admitted 01/13/2020 with a diagnosis of BPH and went to the operating room on 01/13/2020 and underwent the above named procedures.    He was given perioperative antibiotics:  Anti-infectives (From admission, onward)   Start     Dose/Rate Route Frequency Ordered Stop   01/13/20 0919  cefTRIAXone (ROCEPHIN) 2 g in sodium chloride 0.9 % 100 mL IVPB     2 g 200 mL/hr over 30 Minutes Intravenous 30 min pre-op 01/13/20 0919 01/13/20 1210    .  He was given sequential compression devices, early ambulation, for DVT prophylaxis.  He benefited maximally from the hospital stay and there were no complications.    Recent vital signs:  Vitals:   01/14/20 0550 01/14/20 0902  BP: 123/67 128/76  Pulse: (!) 59 62  Resp: 14 16  Temp: 98 F (36.7 C) 97.9 F (36.6 C)  SpO2: 99% 100%    Recent laboratory studies:  Lab Results  Component Value Date   HGB  12.3 (L) 01/14/2020   HGB 13.1 01/13/2020   HGB 15.0 01/13/2020   Lab Results  Component Value Date   WBC 12.0 (H) 01/14/2020   PLT 191 01/14/2020   No results found for: INR Lab Results  Component Value Date   NA 136 01/14/2020   K 4.5 01/14/2020   CL 105 01/14/2020   CO2 23 01/14/2020   BUN 26 (H) 01/14/2020   CREATININE 1.70 (H) 01/14/2020   GLUCOSE 175 (H) 01/14/2020    Discharge Medications:   Allergies as of 01/14/2020      Reactions   Other Itching, Rash   Walnuts and pecans   Penicillins Other (See Comments)   Did it involve swelling of the face/tongue/throat, SOB, or low BP?No Did it involve sudden or severe rash/hives, skin peeling, or any reaction on the inside of your mouth or nose? yes Did you need to seek medical attention at a hospital or doctor's office? no When did it last happen?64yrs old If all above answers are "NO", may proceed with cephalosporin use.      Medication List    STOP taking these medications   tamsulosin 0.4 MG Caps capsule Commonly known as: FLOMAX     TAKE these medications   acidophilus Caps capsule Take by mouth daily. probiotic   aspirin 81 MG tablet Take 81 mg by mouth daily.   atorvastatin 80 MG tablet Commonly known as:  LIPITOR TAKE 1 TABLET BY MOUTH DAILY AT 6 PM What changed: See the new instructions.   fenofibrate 160 MG tablet TAKE 1 TABLET BY MOUTH ONCE DAILY   metoprolol tartrate 25 MG tablet Commonly known as: LOPRESSOR Take 1 tablet (25 mg total) by mouth 2 (two) times daily. Please make overdue appt with Dr. Elease Hashimoto before anymore refills. 2nd attempt What changed:   how much to take  additional instructions   multivitamin with minerals Tabs tablet Take 1 tablet by mouth daily.   naphazoline-pheniramine 0.025-0.3 % ophthalmic solution Commonly known as: NAPHCON-A Place 1 drop into both eyes 4 (four) times daily as needed for eye irritation.   nitroGLYCERIN 0.4 MG SL tablet Commonly known as:  NITROSTAT Place 1 tablet (0.4 mg total) under the tongue every 5 (five) minutes as needed for chest pain.   oxyCODONE-acetaminophen 5-325 MG tablet Commonly known as: Percocet Take 1 tablet by mouth every 4 (four) hours as needed for severe pain. Notes to patient: May first dose at any time   oxymetazoline 0.05 % nasal spray Commonly known as: AFRIN Place 1 spray into both nostrils 2 (two) times daily as needed.   zolpidem 10 MG tablet Commonly known as: AMBIEN Take 10 mg by mouth at bedtime as needed for sleep.       Diagnostic Studies: No results found.  Disposition: Discharge disposition: 01-Home or Self Care         Follow-up Information    Hillery Aldo, NP. Call on 01/19/2020.   Specialty: Nurse Practitioner Contact information: [address] [place] [address] [place] [address] [phone]            Signed: Wilkie Aye 01/19/2020, 8:20 AM

## 2020-01-29 ENCOUNTER — Emergency Department (HOSPITAL_COMMUNITY)
Admission: EM | Admit: 2020-01-29 | Discharge: 2020-01-29 | Disposition: A | Discharge status: Home / Self Care | Payer: 59 | Attending: Emergency Medicine | Admitting: Emergency Medicine

## 2020-01-29 ENCOUNTER — Emergency Department (HOSPITAL_COMMUNITY): Payer: 59

## 2020-01-29 ENCOUNTER — Other Ambulatory Visit: Payer: Self-pay

## 2020-01-29 DIAGNOSIS — I252 Old myocardial infarction: Secondary | ICD-10-CM | POA: Insufficient documentation

## 2020-01-29 DIAGNOSIS — I2510 Atherosclerotic heart disease of native coronary artery without angina pectoris: Secondary | ICD-10-CM | POA: Insufficient documentation

## 2020-01-29 DIAGNOSIS — R1011 Right upper quadrant pain: Secondary | ICD-10-CM

## 2020-01-29 DIAGNOSIS — Z79899 Other long term (current) drug therapy: Secondary | ICD-10-CM | POA: Diagnosis not present

## 2020-01-29 DIAGNOSIS — Z87891 Personal history of nicotine dependence: Secondary | ICD-10-CM | POA: Insufficient documentation

## 2020-01-29 DIAGNOSIS — K8018 Calculus of gallbladder with other cholecystitis without obstruction: Secondary | ICD-10-CM | POA: Diagnosis not present

## 2020-01-29 DIAGNOSIS — I480 Paroxysmal atrial fibrillation: Secondary | ICD-10-CM | POA: Diagnosis not present

## 2020-01-29 LAB — URINALYSIS, ROUTINE W REFLEX MICROSCOPIC
Bilirubin Urine: NEGATIVE
Glucose, UA: NEGATIVE mg/dL
Ketones, ur: NEGATIVE mg/dL
Nitrite: NEGATIVE
Protein, ur: 100 mg/dL — AB
RBC / HPF: >50 RBC/hpf — ABNORMAL HIGH (ref 0–5)
Specific Gravity, Urine: 1.017 (ref 1.005–1.030)
WBC, UA: >50 WBC/hpf — ABNORMAL HIGH (ref 0–5)
pH: 5 (ref 5.0–8.0)

## 2020-01-29 LAB — COMPREHENSIVE METABOLIC PANEL
ALT: 16 U/L (ref 0–44)
AST: 19 U/L (ref 15–41)
Albumin: 3.4 g/dL — ABNORMAL LOW (ref 3.5–5.0)
Alkaline Phosphatase: 63 U/L (ref 38–126)
Anion gap: 10 (ref 5–15)
BUN: 23 mg/dL (ref 8–23)
CO2: 24 mmol/L (ref 22–32)
Calcium: 9.4 mg/dL (ref 8.9–10.3)
Chloride: 103 mmol/L (ref 98–111)
Creatinine, Ser: 2.18 mg/dL — ABNORMAL HIGH (ref 0.61–1.24)
GFR calc Af Amer: 36 mL/min — ABNORMAL LOW (ref >60)
GFR calc non Af Amer: 31 mL/min — ABNORMAL LOW (ref >60)
Glucose, Bld: 170 mg/dL — ABNORMAL HIGH (ref 70–99)
Potassium: 3.9 mmol/L (ref 3.5–5.1)
Sodium: 137 mmol/L (ref 135–145)
Total Bilirubin: 0.6 mg/dL (ref 0.3–1.2)
Total Protein: 5.9 g/dL — ABNORMAL LOW (ref 6.5–8.1)

## 2020-01-29 LAB — CBC
HCT: 38.6 % — ABNORMAL LOW (ref 39.0–52.0)
Hemoglobin: 12.1 g/dL — ABNORMAL LOW (ref 13.0–17.0)
MCH: 26.1 pg (ref 26.0–34.0)
MCHC: 31.3 g/dL (ref 30.0–36.0)
MCV: 83.4 fL (ref 80.0–100.0)
Platelets: 206 10*3/uL (ref 150–400)
RBC: 4.63 MIL/uL (ref 4.22–5.81)
RDW: 13.5 % (ref 11.5–15.5)
WBC: 7.6 10*3/uL (ref 4.0–10.5)
nRBC: 0 % (ref 0.0–0.2)

## 2020-01-29 LAB — TROPONIN I (HIGH SENSITIVITY): Troponin I (High Sensitivity): 18 ng/L — ABNORMAL HIGH (ref <18)

## 2020-01-29 LAB — LIPASE, BLOOD: Lipase: 42 U/L (ref 11–51)

## 2020-01-29 MED ORDER — ONDANSETRON 4 MG PO TBDP: 4.0000 mg | ORAL_TABLET | Freq: Four times a day (QID) | ORAL | 0 refills | Status: DC | PRN

## 2020-01-29 MED ORDER — DICYCLOMINE HCL 20 MG PO TABS
20.0000 mg | ORAL_TABLET | Freq: Three times a day (TID) | ORAL | 0 refills | Status: DC | PRN
Start: 2020-01-29 — End: 2020-02-04

## 2020-01-29 NOTE — ED Notes (Signed)
Pt to US.

## 2020-01-29 NOTE — ED Provider Notes (Signed)
TIME SEEN: 2:51 AM  CHIEF COMPLAINT: Upper abdominal pain  HPI: Patient is a 64 year old male history of hypertension, hyperlipidemia, CAD s/p STEMI and RCA PCI 2015, paroxysmal atrial fibrillation not on anticoagulation who presents to the emergency department with upper abdominal pain that he describes as a cramp that started at 10 PM while at rest tonight.  Describes it as severe in nature with time.  He states he became concerned because when he had his STEMI in 2015 he had pain in a similar place but it felt different than the pain today.  He denies any chest pain, pressure or tightness.  No shortness of breath.  No nausea, vomiting or diarrhea.  No bloody stool or melena.  States for dinner he at schnitzel, vegetables and potatoes.  No abdominal surgeries but had TURP 01/13/20.  He reports his symptoms have improved already without intervention.  He declines any medication at this time.  ROS: See HPI Constitutional: no fever  Eyes: no drainage  ENT: no runny nose   Cardiovascular:  no chest pain  Resp: no SOB  GI: no vomiting GU: no dysuria Integumentary: no rash  Allergy: no hives  Musculoskeletal: no leg swelling  Neurological: no slurred speech ROS otherwise negative  PAST MEDICAL HISTORY/PAST SURGICAL HISTORY:  Past Medical History:  Diagnosis Date  . Abdominal pain, left lower quadrant   . Allergy, unspecified not elsewhere classified   . BPH (benign prostatic hyperplasia)   . CAD (coronary artery disease) March 07, 2014   3.0 x 18 mm Xience drug-eluting stent. The stent was postdilated with a 3.25 noncompliant balloon.  . DJD (degenerative joint disease)   . Elevated prostate specific antigen (PSA)   . Gout   . History of kidney stones   . Hypercholesteremia   . Lumbar back pain   . Palpitations   . PONV (postoperative nausea and vomiting)    nausea    MEDICATIONS:  Prior to Admission medications   Medication Sig Start Date End Date Taking? Authorizing Provider   acidophilus (RISAQUAD) CAPS capsule Take by mouth daily. probiotic    [provider]  aspirin 81 MG tablet Take 81 mg by mouth daily.      [provider]  atorvastatin (LIPITOR) 80 MG tablet TAKE 1 TABLET BY MOUTH DAILY AT 6 PM Patient taking differently: Take 80 mg by mouth every evening.  05/31/18   Nahser, Deloris Ping, MD  fenofibrate 160 MG tablet TAKE 1 TABLET BY MOUTH ONCE DAILY Patient taking differently: Take 160 mg by mouth daily.  06/26/18   Nahser, Deloris Ping, MD  metoprolol tartrate (LOPRESSOR) 25 MG tablet Take 1 tablet (25 mg total) by mouth 2 (two) times daily. Please make overdue appt with Dr. Elease Hashimoto before anymore refills. 2nd attempt Patient taking differently: Take 12.5 mg by mouth 2 (two) times daily.  10/06/19   Patwardhan, Anabel Bene, MD  Multiple Vitamin (MULTIVITAMIN WITH MINERALS) TABS tablet Take 1 tablet by mouth daily.    [provider]  naphazoline-pheniramine (NAPHCON-A) 0.025-0.3 % ophthalmic solution Place 1 drop into both eyes 4 (four) times daily as needed for eye irritation.    [provider]  nitroGLYCERIN (NITROSTAT) 0.4 MG SL tablet Place 1 tablet (0.4 mg total) under the tongue every 5 (five) minutes as needed for chest pain. 08/27/19   Yates Decamp, MD  oxyCODONE-acetaminophen (PERCOCET) 5-325 MG tablet Take 1 tablet by mouth every 4 (four) hours as needed for severe pain. 01/14/20 01/13/21  Wilkie Aye  L, MD  oxymetazoline (AFRIN) 0.05 % nasal spray Place 1 spray into both nostrils 2 (two) times daily as needed.     [provider]  zolpidem (AMBIEN) 10 MG tablet Take 10 mg by mouth at bedtime as needed for sleep.    [provider]    ALLERGIES:  Allergies  Allergen Reactions  . Other Itching and Rash    Walnuts and pecans  . Penicillins Other (See Comments)    Did it involve swelling of the face/tongue/throat, SOB, or low BP?No Did it involve sudden or severe rash/hives, skin peeling, or any reaction  on the inside of your mouth or nose? yes Did you need to seek medical attention at a hospital or doctor's office? no When did it last happen?64yrs old If all above answers are "NO", may proceed with cephalosporin use.     SOCIAL HISTORY:  Social History   Tobacco Use  . Smoking status: Former Smoker    Packs/day: 1.00    Years: 10.00    Pack years: 10.00    Types: Cigarettes    Quit date: 09/18/1980    Years since quitting: 39.3  . Smokeless tobacco: Never Used  . Tobacco comment: 1/2-1 ppd, ages [age]  Substance Use Topics  . Alcohol use: Not Currently    FAMILY HISTORY: Family History  Problem Relation Age of Onset  . Coronary artery disease Father   . Hyperlipidemia Father   . Deep vein thrombosis Father   . Prostate cancer Father   . Diabetes Mother   . Diverticulitis Mother     EXAM: BP 122/73   Pulse (!) 45   Temp 98 F (36.7 C)   Resp 15   Ht 5\' 8"  (1.727 m)   Wt 88.4 kg   SpO2 97%   BMI 29.62 kg/m  CONSTITUTIONAL: Alert and oriented and responds appropriately to questions. Well-appearing; well-nourished HEAD: Normocephalic EYES: Conjunctivae clear, pupils appear equal, EOM appear intact ENT: normal nose; moist mucous membranes NECK: Supple, normal ROM CARD: RRR; S1 and S2 appreciated; no murmurs, no clicks, no rubs, no gallops RESP: Normal chest excursion without splinting or tachypnea; breath sounds clear and equal bilaterally; no wheezes, no rhonchi, no rales, no hypoxia or respiratory distress, speaking full sentences ABD/GI: Normal bowel sounds; non-distended; soft, tender in the upper abdomen and right upper quadrant with negative Murphy sign, no rebound, no guarding, no peritoneal signs, no hepatosplenomegaly BACK:  The back appears normal EXT: Normal ROM in all joints; no deformity noted, no edema; no cyanosis SKIN: Normal color for age and race; warm; no rash on exposed skin NEURO: Moves all extremities equally PSYCH: The patient's mood and  manner are appropriate.   MEDICAL DECISION MAKING: Patient here with upper abdominal pain.  Differential includes cholelithiasis, cholecystitis, pancreatitis, GERD, gastritis, ACS will obtain labs including LFTs, lipase and troponin.  He states he is feeling better and declines any medication at this time.  Will obtain right upper quadrant ultrasound.  EKG here reviewed/interpreted and shows no new ischemic change.  ED PROGRESS: Patient's labs reviewed/interpreted and are reassuring.  Normal LFTs, lipase.  Has chronic kidney disease which is stable.  First troponin is 18.  Given this was obtained over 4 hours after the onset of symptoms, I do not feel it needs to be repeated.  Patient denies that he is having chest pain or shortness of breath.  His ultrasound has been reviewed/interpreted and shows cholelithiasis but no cholecystitis.  Suspect his symptoms were due  to biliary colic.  Will discharge with Bentyl, Zofran to take as needed and outpatient general surgery follow-up.  Recommended low-fat diet.  Patient's urine has been reviewed/interpreted and does show large leukocytes, greater than 50 red blood cells and greater than 50 white blood cells but suspect this is due to his recent TURP and patient and wife agree.  He is not having dysuria, hematuria or discharge.  Culture has been sent but he can follow-up with his urologist as an outpatient.  I do not feel he needs antibiotics at this time.   At this time, I do not feel there is any life-threatening condition present. I have reviewed, interpreted and discussed all results (EKG, imaging, lab, urine as appropriate) and exam findings with patient/family. I have reviewed nursing notes and appropriate previous records.  I feel the patient is safe to be discharged home without further emergent workup and can continue workup as an outpatient as needed. Discussed usual and customary return precautions. Patient/family verbalize understanding and are comfortable  with this plan.  Outpatient follow-up has been provided as needed. All questions have been answered.     EKG Interpretation  Date/Time:  Thursday Jan 29 2020 02:15:43 EDT Ventricular Rate:  51 PR Interval:    QRS Duration: 103 QT Interval:  458 QTC Calculation: 422 R Axis:   51 Text Interpretation: Sinus rhythm Inferior infarct, old ST elevation, consider anterior injury No significant change since last tracing Confirmed by Rochele Raring [phone]) on 01/29/2020 2:21:25 AM          Dwayne Larsen was evaluated in Emergency Department on 01/29/2020 for the symptoms described in the history of present illness. He was evaluated in the context of the global COVID-19 pandemic, which necessitated consideration that the patient might be at risk for infection with the SARS-CoV-2 virus that causes COVID-19. Institutional protocols and algorithms that pertain to the evaluation of patients at risk for COVID-19 are in a state of rapid change based on information released by regulatory bodies including the CDC and federal and state organizations. These policies and algorithms were followed during the patient's care in the ED.      Tya Haughey, Layla Maw, DO 01/29/20 [phone]

## 2020-01-29 NOTE — ED Notes (Signed)
Culture sent with UA collection

## 2020-01-29 NOTE — ED Notes (Signed)
Discharge instructions discussed with pt and wife at bedside. Pt was able to verbalize understanding. Opportunity for questions given. Pt ambulatory at discharge

## 2020-01-29 NOTE — ED Triage Notes (Signed)
Pt BIB [facility] from home c/o sudden onset of abdominal pain. Described as cramping, mid stomach, x1 forced emesis. Onset about 4 hours ago.   Pt states feels similar to MI in 2015. Took 1 NTG and 324 ASA with no relief of CP.

## 2020-01-29 NOTE — Discharge Instructions (Signed)
Your labs today were very reassuring as was your EKG.  Your ultrasound showed gallstones but no sign of infection.  I recommend a low-fat diet to prevent further symptoms but if you continue to have problems with your gallbladder, please follow-up with surgery as an outpatient to discuss elective surgical removal of the gallbladder.  If you develop worsening pain that is uncontrolled, vomiting that does not stop or fever, please return to the emergency department as these could be signs of infection and you may need emergent surgery.  Your cardiac work-up today was also very reassuring.  It does not look like you are having a heart attack today.  Your urine does show a large amount of red blood cells and white blood cells but I suspect this is due to your recent prostate surgery.  You can follow-up with your urologist as an outpatient.  You do not need to be on antibiotics at this time.  We have sent pain medication and nausea medicine to your pharmacy in case you need this in the future.

## 2020-01-31 LAB — URINE CULTURE: Culture: >=100000 — AB

## 2020-01-31 NOTE — Progress Notes (Signed)
Rescheduled

## 2020-02-01 ENCOUNTER — Telehealth: Payer: Self-pay | Admitting: Emergency Medicine

## 2020-02-01 NOTE — Progress Notes (Signed)
ED Antimicrobial Stewardship Positive Culture Follow Up   Dwayne Larsen is an 64 y.o. male who presented to [facility] on 01/29/2020 with a chief complaint of upper abdominal pain and cramps s/p TURP on 01/13/20  Chief Complaint  Patient presents with  . Abdominal Pain    Recent Results (from the past 720 hour(s))  SARS CORONAVIRUS 2 (TAT 6-24 HRS) Nasopharyngeal Nasopharyngeal Swab     Status: None   Collection Time: 01/09/20  2:21 PM   Specimen: Nasopharyngeal Swab  Result Value Ref Range Status   SARS Coronavirus 2 NEGATIVE NEGATIVE Final    Comment: (NOTE) SARS-CoV-2 target nucleic acids are NOT DETECTED. The SARS-CoV-2 RNA is generally detectable in upper and lower respiratory specimens during the acute phase of infection. Negative results do not preclude SARS-CoV-2 infection, do not rule out co-infections with other pathogens, and should not be used as the sole basis for treatment or other patient management decisions. Negative results must be combined with clinical observations, patient history, and epidemiological information. The expected result is Negative. Fact Sheet for Patients: HairSlick.no Fact Sheet for Healthcare Providers: quierodirigir.com This test is not yet approved or cleared by the [place] FDA and  has been authorized for detection and/or diagnosis of SARS-CoV-2 by FDA under an Emergency Use Authorization (EUA). This EUA will remain  in effect (meaning this test can be used) for the duration of the COVID-19 declaration under Section 56 4(b)(1) of the Act, 21 U.S.C. section 360bbb-3(b)(1), unless the authorization is terminated or revoked sooner. Performed at [facility], [address], [place], [place] [address]   Urine culture     Status: Abnormal   Collection Time: 01/29/20  4:07 AM   Specimen: Urine, Random  Result Value Ref Range Status   Specimen Description URINE, RANDOM  Final   Special Requests   Final    NONE Performed at [facility], [address], [place], [place] [address]    Culture >=100,000 COLONIES/mL ENTEROBACTER CLOACAE (A)  Final   Report Status 01/31/2020 FINAL  Final   Organism ID, Bacteria ENTEROBACTER CLOACAE (A)  Final      Susceptibility   Enterobacter cloacae - MIC*    CEFAZOLIN >=64 RESISTANT Resistant     CEFTRIAXONE <=1 SENSITIVE Sensitive     CIPROFLOXACIN <=0.25 SENSITIVE Sensitive     GENTAMICIN <=1 SENSITIVE Sensitive     IMIPENEM 1 SENSITIVE Sensitive     NITROFURANTOIN 32 SENSITIVE Sensitive     TRIMETH/SULFA <=20 SENSITIVE Sensitive     PIP/TAZO <=4 SENSITIVE Sensitive     * >=100,000 COLONIES/mL ENTEROBACTER CLOACAE    Patient placement to call patient and conduct symptom check. If patient has not followed up with urology yet, per discharge recommendations, and symptoms persist, please prescribe cefdinir 300 mg twice daily x 10 days   ED Provider: Morrell Riddle, PharmD., BCPS, BCCCP Clinical Pharmacist Clinical phone for 02/01/20 until 10pm: [phone] If after 10pm, please refer to [facility] for unit-specific pharmacist

## 2020-02-01 NOTE — Telephone Encounter (Signed)
Post ED Visit - Positive Culture Follow-up: Unsuccessful Patient Follow-up  Culture assessed and recommendations reviewed by:  []  , Pharm.D. []  Enzo Bi, Pharm.D., BCPS AQ-ID []  , Pharm.D., BCPS []  Celedonio Miyamoto, Pharm.D., BCPS []  [place], Garvin Fila.D., BCPS, AAHIVP []  , Pharm.D., BCPS, AAHIVP [x]  Georgina Pillion, PharmD []  , PharmD, BCPS  Positive urine culture  [x]  Patient discharged without antimicrobial prescription and treatment is now indicated []  Organism is resistant to prescribed ED discharge antimicrobial []  Patient with positive blood cultures   Unable to contact patient at phone number on file, letter will be sent to address on file  [place] 02/01/2020, 4:56 PM

## 2020-02-02 ENCOUNTER — Telehealth: Payer: Self-pay | Admitting: *Deleted

## 2020-02-02 NOTE — Telephone Encounter (Signed)
Contacted by patient via phone and states that symtoms are resolved from 01/29/2020 ED visit.  No further treatment is required at this time.

## 2020-02-04 ENCOUNTER — Other Ambulatory Visit: Payer: Self-pay

## 2020-02-04 ENCOUNTER — Ambulatory Visit: Payer: 59 | Admitting: Cardiology

## 2020-02-04 ENCOUNTER — Encounter: Payer: Self-pay | Admitting: Cardiology

## 2020-02-04 VITALS — BP 142/77 | HR 68 | Resp 18 | Ht 68.0 in | Wt 195.0 lb

## 2020-02-04 DIAGNOSIS — I2510 Atherosclerotic heart disease of native coronary artery without angina pectoris: Secondary | ICD-10-CM

## 2020-02-04 DIAGNOSIS — I480 Paroxysmal atrial fibrillation: Secondary | ICD-10-CM

## 2020-02-04 NOTE — Progress Notes (Signed)
Follow up visit  Subjective:   Dwayne Larsen, male    DOB: Jul 22, 1956, 64 y.o.   MRN: [id number]   HPI   Chief Complaint  Patient presents with  . Atrial Fibrillation  . Follow-up    3 month    64 y.o. Caucasian male with controlled hypertension, hyperlipidemia, CAD s/p STEMI and RCA PCI 2015, paroxysmal atrial fibrillation.  At patient's last visit with me, I recommended event monitor to evaluate for his episodes of palpitations.  Event monitor showed at least 7 episodes of atrial fibrillation with RVR with or without symptoms.  Fastest heart rate with A. fib was surprisingly fast at 208 bpm.  He had 3-second pauses postconversion from atrial fibrillation.  Given that he is minimally symptomatic, he was reluctant to try rhythm control therapy.  We discussed rate control therapy with increasing dose of metoprolol tartrate to 25 mg twice daily.  ACE inhibitor would be also a reasonable choice given his hypertension and its antifibrotic properties.  However, he would like to avoid starting any medication at this time.  Finally, we discussed risks benefits of anticoagulation given his CHA2DS2VASc score of 2, and annual stroke risk of 2.2%.  I explained to the patient that presence or absence of symptoms is unrelated to his risk of stroke based on his risk factors.  Knowing the risks and benefits, he preferred to avoid anticoagulation and preferred to continue aspirin 81 mg daily for his coronary disease.  I also gave him the option of left atrial appendage closure, but he was not interested at that time.  Patient is here today for follow-up visit.  He has had 2 episodes of palpitations in the last 6 months.  Otherwise, he is doing well without any symptoms of chest pain, shortness of breath.  He again expressed his preference to avoid anticoagulation at this time.  Current Outpatient Medications on File Prior to Visit  Medication Sig Dispense Refill  . aspirin 81 MG tablet Take 81 mg by  mouth daily.      Marland Kitchen atorvastatin (LIPITOR) 80 MG tablet TAKE 1 TABLET BY MOUTH DAILY AT 6 PM (Patient taking differently: Take 80 mg by mouth every evening. ) 30 tablet 0  . fenofibrate 160 MG tablet TAKE 1 TABLET BY MOUTH ONCE DAILY (Patient taking differently: Take 160 mg by mouth daily. ) 90 tablet 3  . metoprolol tartrate (LOPRESSOR) 25 MG tablet Take 1 tablet (25 mg total) by mouth 2 (two) times daily. Please make overdue appt with Dr. Acie Fredrickson before anymore refills. 2nd attempt (Patient taking differently: Take 12.5 mg by mouth 2 (two) times daily. ) 180 tablet 2  . Multiple Vitamin (MULTIVITAMIN WITH MINERALS) TABS tablet Take 1 tablet by mouth daily.    . nitroGLYCERIN (NITROSTAT) 0.4 MG SL tablet Place 1 tablet (0.4 mg total) under the tongue every 5 (five) minutes as needed for chest pain. 25 tablet 3  . ondansetron (ZOFRAN ODT) 4 MG disintegrating tablet Take 1 tablet (4 mg total) by mouth every 6 (six) hours as needed. 20 tablet 0  . oxymetazoline (AFRIN) 0.05 % nasal spray Place 1 spray into both nostrils 2 (two) times daily as needed.     . zolpidem (AMBIEN) 10 MG tablet Take 10 mg by mouth at bedtime as needed for sleep.     No current facility-administered medications on file prior to visit.     Cardiovascular & other pertient studies:  Event monitor 08/26/2019 - 09/24/2019:  Diagnostic time: 99%  Dominant rhythm: Sinus.  HR 46-208 bpm. Avg HR 70 bpm.  7 episodes of Afib w/RVR, associated with and without symptoms of flutter or skipped beats.  Afib burden: 2% event monitor time.  One post Afib conversion pause of 3 sec.  Occasional PVC's.  No atrial atrial flutter/SVT/VT/high grade AV block, sinus pause >3sec noted.   EKG 08/25/2019: Sinus rhythm 54 bpm. Old inferior infarct.   Echocardiogram 2015: - Left ventricle: Average global LV strain is -17.3% The cavity  size was normal. There was mild focal basal hypertrophy of the  septum. Systolic function was mildly  reduced. The estimated  ejection fraction was 50%. There is akinesis of the  basal-midinferior myocardium. There is akinesis of the  basalinferoseptal myocardium.  - Mitral valve: There was mild regurgitation.   Coronary angiography and intervention 2015 (Dr. Fletcher Anon): LM: Normal LAD: Prox diffuse 20% disease LCx: Mid 50% stenosis. Mild calcification RCA: Large, dominant. Mid 50% stenosis with distal occlusion with thrombus. Prox-mid PDA 60% stenosis.      Aspiration thrombectomy and PCI dRCA 3.0 X 18 mm Xience DES   Recent labs: 07/21/2019: Glucose 109, BUN/Cr 21/1.9. EGFR 36. Na/K 140/4.4. 13.6Rest of the CMP normal H/H 13.6/42.2. MCV 83.6. Platelets 164 Chol 123, TG 85, HDL 35, LDL 71 TSH normal  06/05/2018: Chol 133, TG 181, HDL 32, LDL 65  05/24/2019: Glucose 97, BUN/Cr 20/2.02. EGFR 34. Na/K 138/3.8. Rest of the CMP normal   Review of Systems  Cardiovascular: Positive for palpitations. Negative for chest pain, dyspnea on exertion, leg swelling and syncope.         Vitals:   02/04/20 1426  BP: (!) 142/77  Pulse: 68  Resp: 18  SpO2: 99%     Body mass index is 29.65 kg/m. Filed Weights   02/04/20 1426  Weight: 195 lb (88.5 kg)     Objective:   Physical Exam  Constitutional: No distress.  Neck: No JVD present.  Cardiovascular: Normal rate, regular rhythm, normal heart sounds and intact distal pulses.  No murmur heard. Pulmonary/Chest: Effort normal and breath sounds normal. He has no wheezes. He has no rales.  Musculoskeletal:        General: No edema.  Nursing note and vitals reviewed.        Assessment & Recommendations:   64 y.o. Caucasian male with controlled hypertension, hyperlipidemia, CAD s/p STEMI and RCA PCI 2015, paroxysmal atrial fibrillation.  Paroxysmal atrial fibrillation: Occasional episodes with symptoms of palpitations. Given that he is minimally symptomatic, continued rate control therapy with metoprolol tartrate 25  mg twice daily is reasonable.    CHA2DS2VASc score of 2, and annual stroke risk of 2.2%.   I explained to the patient that presence or absence of symptoms is unrelated to his risk of stroke based on his risk factors.  Knowing the risks and benefits, he will avoid anticoagulation at this time and would like to continue aspirin 81 mg daily for his coronary disease.  I also gave him the option of left atrial appendage closure, but he would not like to consider it at this time.  In terms of etiology evaluation for atrial fibrillation, he does not have any ischemic symptoms of the time.  Any residual of ischemia testing.  He would also like to hold off sleep study evaluation at this time.  Coronary artery disease: No angina symptoms.  Continue aspirin, statin.  Increase metoprolol dose as above.  Follow-up in 1 year  Nigel Mormon, MD [facility]  Pager: [phone] Office: [phone]

## 2020-04-21 ENCOUNTER — Other Ambulatory Visit: Payer: Self-pay

## 2020-04-21 NOTE — Telephone Encounter (Signed)
Pt's pharmacy is requesting a refill on fenofibrate. Would Dr. Elease Hashimoto like to refill this medication? Please address

## 2020-04-21 NOTE — Telephone Encounter (Signed)
It looks like the patient has been seeing a different doctor for cardiology care.  The last time he had a visit with Dr. Elease Hashimoto was 05/2018.  Should probably be filled by Dr. Rosemary Holms.

## 2020-04-22 ENCOUNTER — Other Ambulatory Visit: Payer: Self-pay

## 2020-04-22 MED ORDER — FENOFIBRATE 160 MG PO TABS: 160.0000 mg | ORAL_TABLET | Freq: Every day | ORAL | 0 refills | Status: DC

## 2020-05-14 ENCOUNTER — Other Ambulatory Visit: Payer: Self-pay | Admitting: Sleep Medicine

## 2020-05-14 ENCOUNTER — Other Ambulatory Visit: Payer: 59

## 2020-05-14 DIAGNOSIS — Z20822 Contact with and (suspected) exposure to covid-19: Secondary | ICD-10-CM

## 2020-05-16 LAB — SARS-COV-2, NAA 2 DAY TAT

## 2020-05-16 LAB — NOVEL CORONAVIRUS, NAA: SARS-CoV-2, NAA: NOT DETECTED

## 2020-07-29 ENCOUNTER — Other Ambulatory Visit (HOSPITAL_COMMUNITY): Payer: Self-pay | Admitting: Internal Medicine

## 2020-07-29 DIAGNOSIS — K8020 Calculus of gallbladder without cholecystitis without obstruction: Secondary | ICD-10-CM

## 2020-08-16 ENCOUNTER — Other Ambulatory Visit: Payer: Self-pay

## 2020-08-16 ENCOUNTER — Encounter (HOSPITAL_COMMUNITY)
Admission: RE | Admit: 2020-08-16 | Discharge: 2020-08-16 | Disposition: A | Discharge status: Home / Self Care | Payer: 59 | Source: Ambulatory Visit | Attending: Internal Medicine | Admitting: Internal Medicine

## 2020-08-16 DIAGNOSIS — K8020 Calculus of gallbladder without cholecystitis without obstruction: Secondary | ICD-10-CM

## 2020-08-16 MED ORDER — TECHNETIUM TC 99M MEBROFENIN IV KIT
5.5000 | PACK | Freq: Once | INTRAVENOUS | Status: AC | PRN
  Administered 2020-08-16: 5.5 via INTRAVENOUS

## 2020-08-17 ENCOUNTER — Other Ambulatory Visit: Payer: Self-pay | Admitting: Cardiology

## 2020-08-17 ENCOUNTER — Other Ambulatory Visit: Payer: Self-pay

## 2020-08-17 MED ORDER — FENOFIBRATE 160 MG PO TABS: 160.0000 mg | ORAL_TABLET | Freq: Every day | ORAL | 3 refills | Status: DC

## 2020-10-27 DIAGNOSIS — Z20822 Contact with and (suspected) exposure to covid-19: Secondary | ICD-10-CM | POA: Diagnosis not present

## 2020-11-02 IMAGING — CT CT ABD-PELV W/O CM
1 of 2 series · 14 of 32 positions shown, 19 images · non-contrast
Comparison: CT abdomen pelvis dated December 01, 2009.

CLINICAL DATA: Acute right lower quadrant pain.  Loose stools.

EXAM:
CT ABDOMEN AND PELVIS WITHOUT CONTRAST
TECHNIQUE: Multidetector CT imaging of the abdomen and pelvis was performed
following the standard protocol without IV contrast.

[Series 2: abd/pelvis w/(date) · axial · 0.91mm/px · z∈[-444,-54]mm · 14 of 88 slices shown, 19 images]
[im 5/88  soft-tissue]
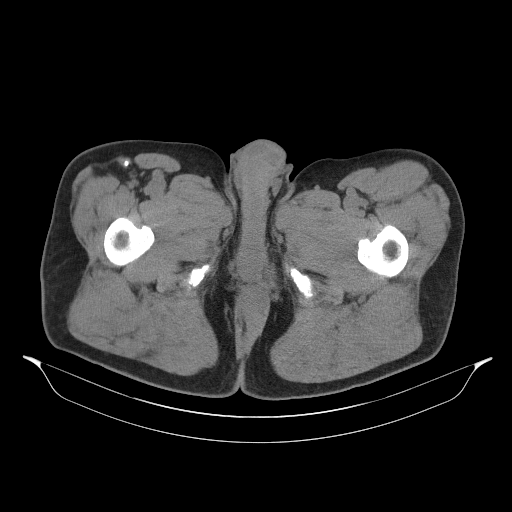
[im 5/88  bone]
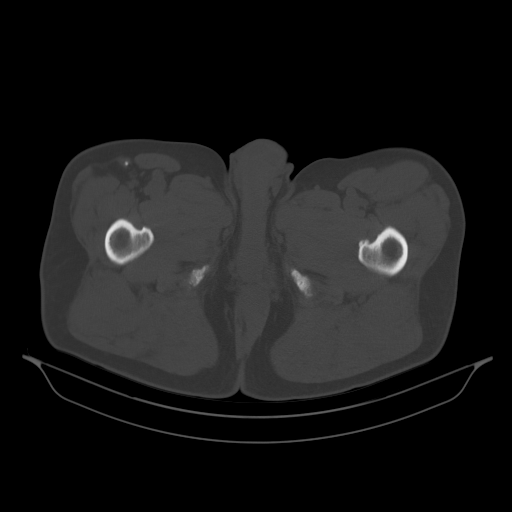
[im 13/88  soft-tissue]
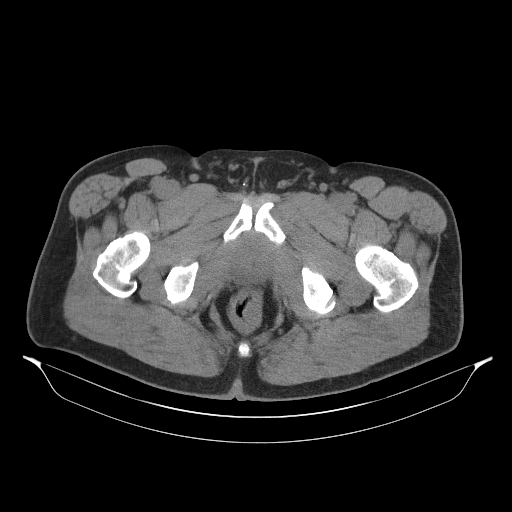
[im 17/88  soft-tissue]
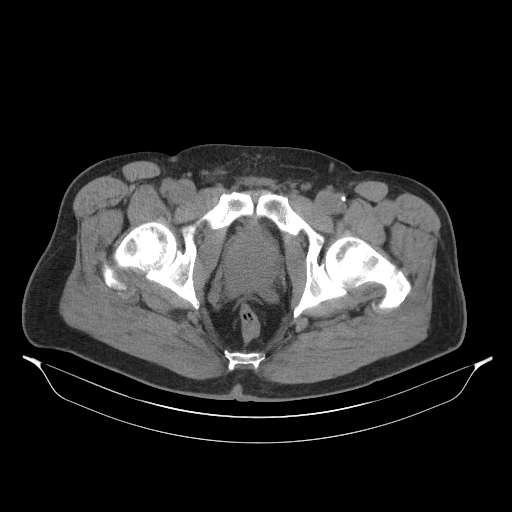
[im 25/88  soft-tissue]
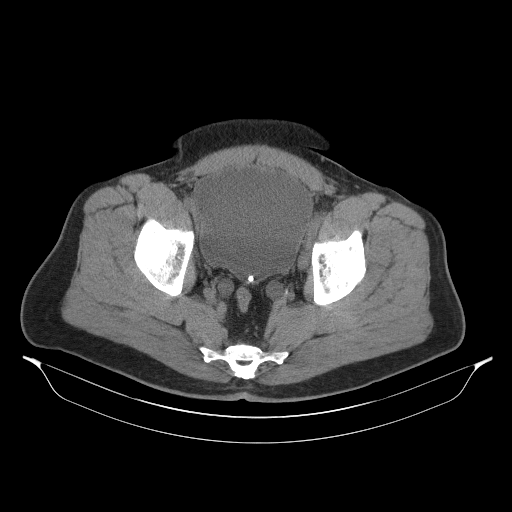
[im 30/88  soft-tissue]
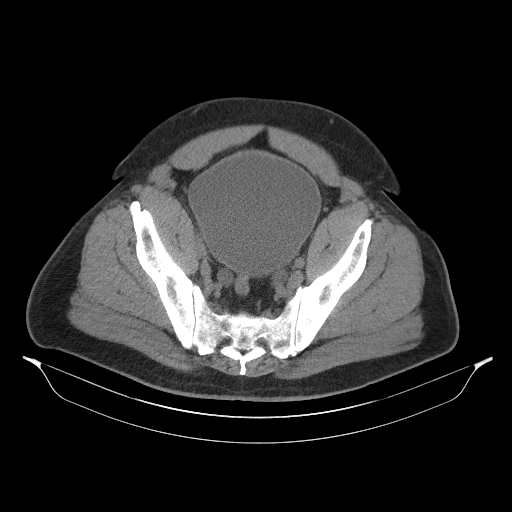
[im 38/88  soft-tissue]
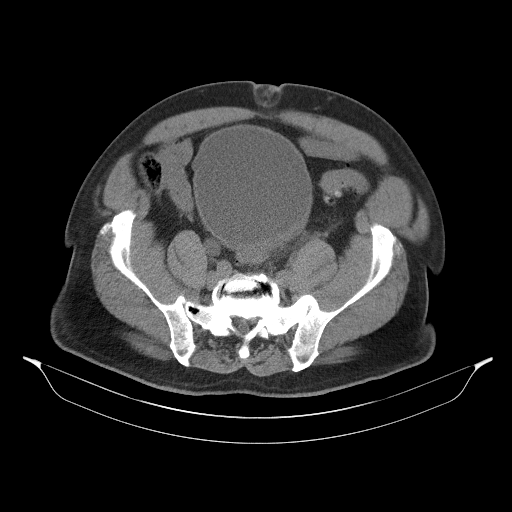
[im 46/88  soft-tissue]
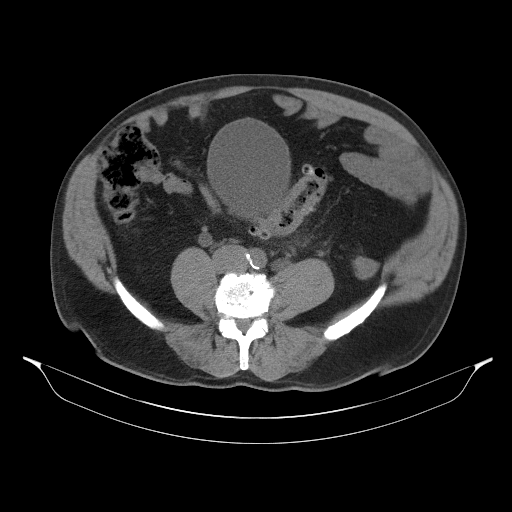
[im 50/88  soft-tissue]
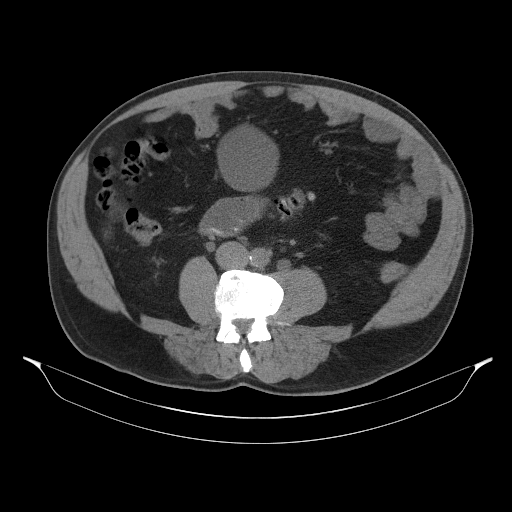
[im 59/88  soft-tissue]
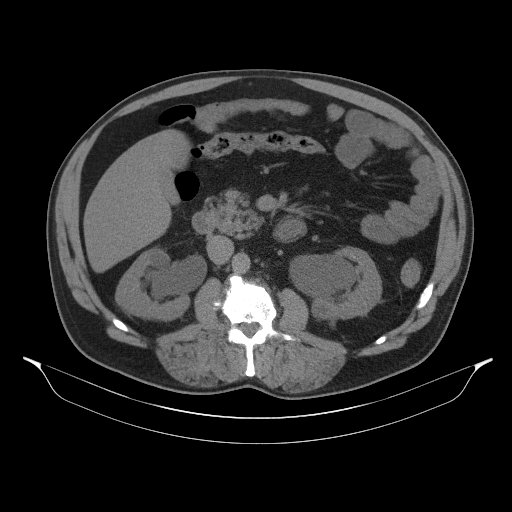
[im 59/88  bone]
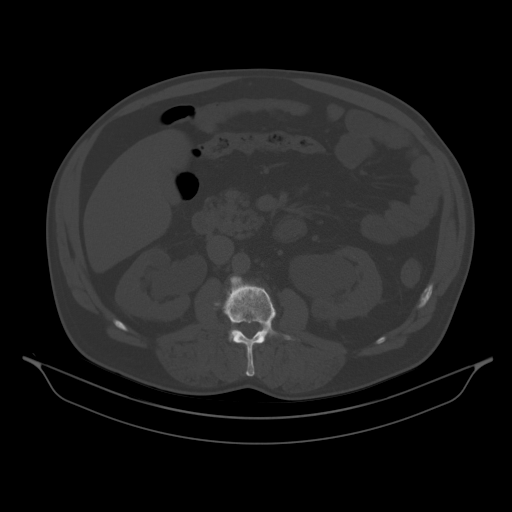
[im 63/88  soft-tissue]
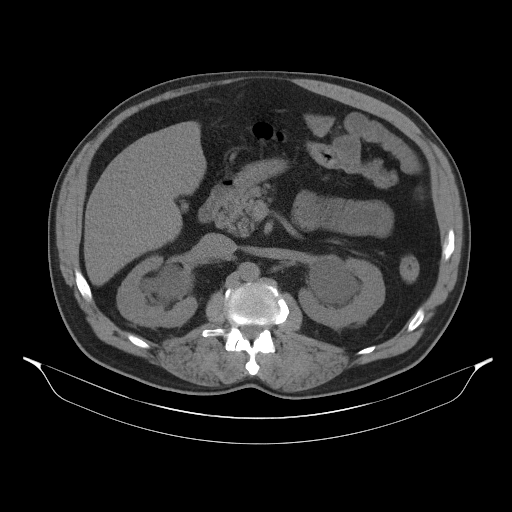
[im 71/88  soft-tissue]
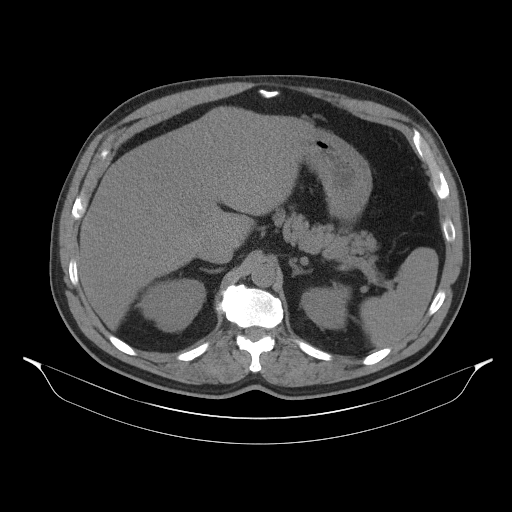
[im 71/88  lung]
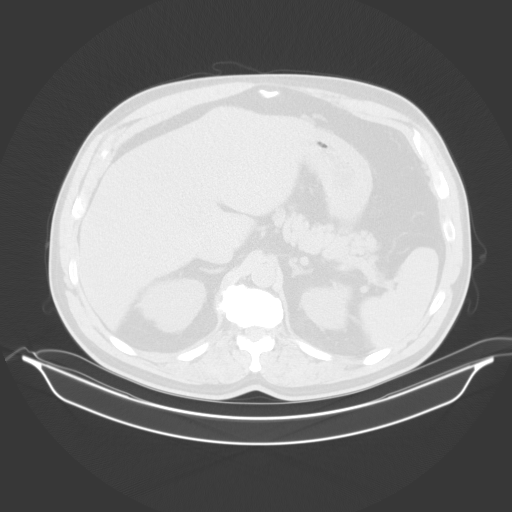
[im 75/88  soft-tissue]
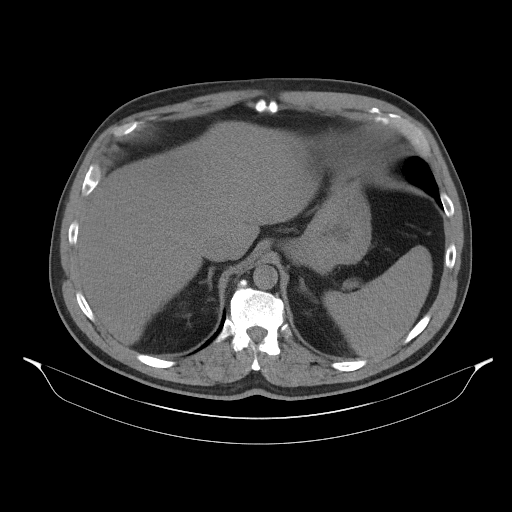
[im 75/88  lung]
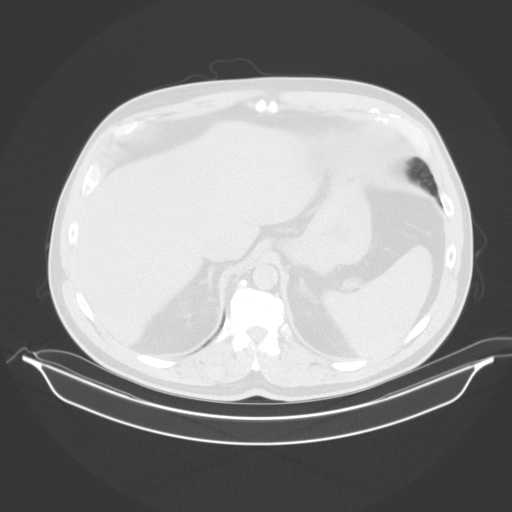
[im 79/88  lung]
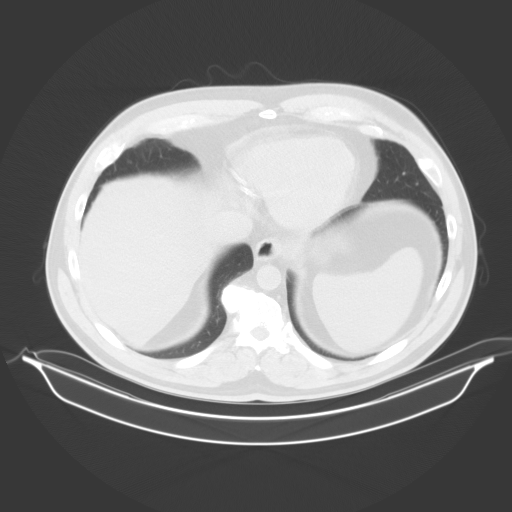
[im 83/88  soft-tissue]
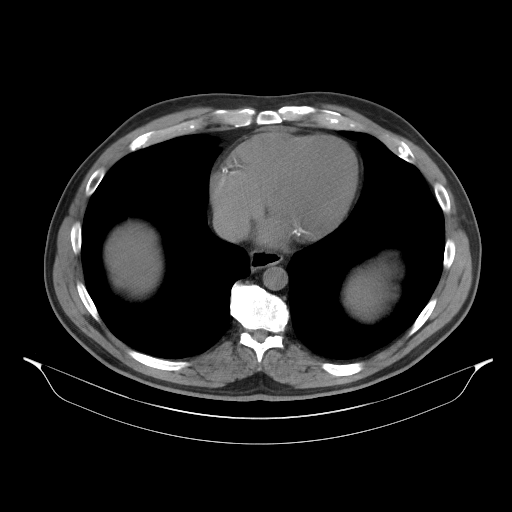
[im 83/88  lung]
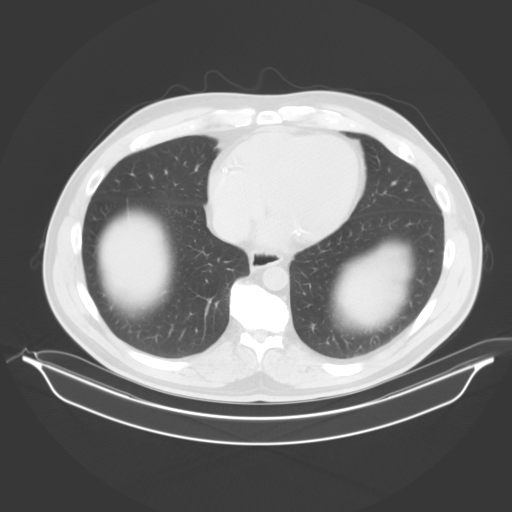

[14 of 32 positions shown; findings below may reference images not displayed]

FINDINGS: Lower chest: No acute abnormality.

Hepatobiliary: Hepatic steatosis. No focal liver abnormality. The
gallbladder is contracted. No biliary dilatation.

Pancreas: Unremarkable. No pancreatic ductal dilatation or
surrounding inflammatory changes.

Spleen: Normal in size without focal abnormality.

Adrenals/Urinary Tract: The adrenal glands are unremarkable.
Punctate nonobstructive calculus in the right kidney. Moderate to
severe bilateral hydroureteronephrosis. There are three 3-4 mm
calculi in the bladder near the right UVJ. There are two additional
calculi in the dependent midline bladder measuring up to 6 mm.
Significant bladder distention with several diverticula.

Stomach/Bowel: Extensive sigmoid diverticulosis. Wall thickening of
the mid sigmoid colon with surrounding inflammatory changes,
consistent with acute diverticulitis. Small hiatal hernia. The
stomach is otherwise within normal limits. No obstruction. Normal
appendix.

Vascular/Lymphatic: Aortic atherosclerosis. No enlarged abdominal or
pelvic lymph nodes.

Reproductive: Prostatomegaly with central gland hypertrophy.

Other: No free fluid, fluid collection, or pneumoperitoneum.

Musculoskeletal: No acute or significant osseous findings.
IMPRESSION: 1. Acute diverticulitis of the mid sigmoid colon. No abscess or
perforation.
2. Markedly distended bladder with moderate to severe bilateral
hydroureteronephrosis secondary to urinary retention from bladder
outlet obstruction.
3. Five calculi within the bladder, three of which are at or just
beyond the right UVJ.
4. Hepatic steatosis.
5.  Aortic atherosclerosis ([id number]).

These results will be called to the ordering clinician or
representative by the Radiologist Assistant, and communication
documented in the PACS or zVision Dashboard.

## 2020-11-12 ENCOUNTER — Other Ambulatory Visit: Payer: Self-pay | Admitting: Cardiology

## 2020-11-12 DIAGNOSIS — I480 Paroxysmal atrial fibrillation: Secondary | ICD-10-CM

## 2020-11-24 DIAGNOSIS — L723 Sebaceous cyst: Secondary | ICD-10-CM | POA: Diagnosis not present

## 2020-12-29 DIAGNOSIS — D225 Melanocytic nevi of trunk: Secondary | ICD-10-CM | POA: Diagnosis not present

## 2020-12-29 DIAGNOSIS — L309 Dermatitis, unspecified: Secondary | ICD-10-CM | POA: Diagnosis not present

## 2020-12-29 DIAGNOSIS — D1801 Hemangioma of skin and subcutaneous tissue: Secondary | ICD-10-CM | POA: Diagnosis not present

## 2020-12-29 DIAGNOSIS — L821 Other seborrheic keratosis: Secondary | ICD-10-CM | POA: Diagnosis not present

## 2021-02-04 ENCOUNTER — Ambulatory Visit: Payer: 59 | Admitting: Cardiology

## 2021-03-02 ENCOUNTER — Encounter: Payer: Self-pay | Admitting: Cardiology

## 2021-03-02 ENCOUNTER — Other Ambulatory Visit: Payer: Self-pay

## 2021-03-02 ENCOUNTER — Ambulatory Visit: Payer: Medicare Other | Admitting: Cardiology

## 2021-03-02 ENCOUNTER — Ambulatory Visit: Payer: Self-pay | Admitting: Cardiology

## 2021-03-02 VITALS — BP 148/73 | HR 67 | Temp 98.0°F | Resp 17 | Ht 68.0 in | Wt 200.0 lb

## 2021-03-02 DIAGNOSIS — I480 Paroxysmal atrial fibrillation: Secondary | ICD-10-CM

## 2021-03-02 DIAGNOSIS — I2510 Atherosclerotic heart disease of native coronary artery without angina pectoris: Secondary | ICD-10-CM

## 2021-03-02 DIAGNOSIS — E782 Mixed hyperlipidemia: Secondary | ICD-10-CM

## 2021-03-02 NOTE — Progress Notes (Signed)
Follow up visit  Subjective:   Dwayne Larsen, male    DOB: March 11, 1956, 65 y.o.   MRN: [id number]   HPI   Chief Complaint  Patient presents with   Coronary Artery Disease   Atrial Fibrillation   Follow-up    1 year    65 y.o. Caucasian male with controlled hypertension, hyperlipidemia, CAD s/p STEMI and RCA PCI 2015, paroxysmal atrial fibrillation.  Patient is doing well, denies chest pain, shortness of breath, palpitations, leg edema, orthopnea, PND, TIA/syncope. Blood pressure has been elevated in 140s-150s SBP.   Previous office note 01/2020: At patient's last visit with me, I recommended event monitor to evaluate for his episodes of palpitations.  Event monitor showed at least 7 episodes of atrial fibrillation with RVR with or without symptoms.  Fastest heart rate with A. fib was surprisingly fast at 208 bpm.  He had 3-second pauses postconversion from atrial fibrillation.  Given that he is minimally symptomatic, he was reluctant to try rhythm control therapy.  We discussed rate control therapy with increasing dose of metoprolol tartrate to 25 mg twice daily.  ACE inhibitor would be also a reasonable choice given his hypertension and its antifibrotic properties.  However, he would like to avoid starting any medication at this time.  Finally, we discussed risks benefits of anticoagulation given his CHA2DS2VASc score of 2, and annual stroke risk of 2.2%.  I explained to the patient that presence or absence of symptoms is unrelated to his risk of stroke based on his risk factors.  Knowing the risks and benefits, he preferred to avoid anticoagulation and preferred to continue aspirin 81 mg daily for his coronary disease.  I also gave him the option of left atrial appendage closure, but he was not interested at that time.  Patient is here today for follow-up visit.  He has had 2 episodes of palpitations in the last 6 months.  Otherwise, he is doing well without any symptoms of chest pain,  shortness of breath.  He again expressed his preference to avoid anticoagulation at this time.  Current Outpatient Medications on File Prior to Visit  Medication Sig Dispense Refill   aspirin 81 MG tablet Take 81 mg by mouth daily.       atorvastatin (LIPITOR) 80 MG tablet TAKE 1 TABLET BY MOUTH DAILY AT 6 PM (Patient taking differently: Take 80 mg by mouth every evening. ) 30 tablet 0   fenofibrate 160 MG tablet TAKE 1 TABLET BY MOUTH EVERY DAY- PLEASE MAKE OVERDUE APPT BEFORE ANYMORE REFILLS 90 tablet 2   metoprolol tartrate (LOPRESSOR) 25 MG tablet TAKE 1 TABLET BY MOUTH TWICE DAILY 180 tablet 2   Multiple Vitamin (MULTIVITAMIN WITH MINERALS) TABS tablet Take 1 tablet by mouth daily.     nitroGLYCERIN (NITROSTAT) 0.4 MG SL tablet Place 1 tablet (0.4 mg total) under the tongue every 5 (five) minutes as needed for chest pain. 25 tablet 3   ondansetron (ZOFRAN ODT) 4 MG disintegrating tablet Take 1 tablet (4 mg total) by mouth every 6 (six) hours as needed. 20 tablet 0   oxymetazoline (AFRIN) 0.05 % nasal spray Place 1 spray into both nostrils 2 (two) times daily as needed.      zolpidem (AMBIEN) 10 MG tablet Take 10 mg by mouth at bedtime as needed for sleep.     No current facility-administered medications on file prior to visit.     Cardiovascular & other pertient studies:  EKG 03/02/2021: Sinus rhythm 66 bpm Left  atrial enlargement Nonspecific T-abnormality  Event monitor 08/26/2019 - 09/24/2019:  Diagnostic time: 99%  Dominant rhythm: Sinus.  HR 46-208 bpm. Avg HR 70 bpm.  7 episodes of Afib w/RVR, associated with and without symptoms of flutter or skipped beats.  Afib burden: 2% event monitor time.  One post Afib conversion pause of 3 sec.  Occasional PVC's.  No atrial atrial flutter/SVT/VT/high grade AV block, sinus pause >3sec noted.     Echocardiogram 2015: - Left ventricle: Average global LV strain is -17.3% The cavity    size was normal. There was mild focal basal  hypertrophy of the    septum. Systolic function was mildly reduced. The estimated    ejection fraction was 50%. There is akinesis of the    basal-midinferior myocardium. There is akinesis of the    basalinferoseptal myocardium.  - Mitral valve: There was mild regurgitation.    Coronary angiography and intervention 2015 (Dr. Fletcher Anon): LM: Normal LAD: Prox diffuse 20% disease LCx: Mid 50% stenosis. Mild calcification RCA: Large, dominant. Mid 50% stenosis with distal occlusion with thrombus. Prox-mid PDA 60% stenosis.      Aspiration thrombectomy and PCI dRCA 3.0 X 18 mm Xience DES     Recent labs: 01/29/2020: Glucose 170, BUN/Cr 23/2.1. EGFR 31. Na/K 137/3.9. Rest of the CMP normal H/H 12/38. MCV 83. Platelets 206  Results for KIMMIE, DOREN (MRN [id number]) as of 03/02/2021 12:51  Ref. Range 01/29/2020 02:26  Troponin I (High Sensitivity) Latest Ref Range: <18 ng/L 18 (H)    07/21/2019: Glucose 109, BUN/Cr 21/1.9. EGFR 36. Na/K 140/4.4. 13.6Rest of the CMP normal H/H 13.6/42.2. MCV 83.6. Platelets 164 Chol 123, TG 85, HDL 35, LDL 71 TSH normal   06/05/2018: Chol 133, TG 181, HDL 32, LDL 65    05/24/2019: Glucose 97, BUN/Cr 20/2.02. EGFR 34. Na/K 138/3.8. Rest of the CMP normal    Review of Systems  Cardiovascular:  Positive for palpitations. Negative for chest pain, dyspnea on exertion, leg swelling and syncope.        Vitals:   03/02/21 1451  BP: (!) 148/73  Pulse: 67  Resp: 17  Temp: 98 F (36.7 C)  SpO2: 97%    Body mass index is 30.41 kg/m. Filed Weights   03/02/21 1451  Weight: 200 lb (90.7 kg)     Objective:   Physical Exam Vitals and nursing note reviewed.  Constitutional:      General: He is not in acute distress. Neck:     Vascular: No JVD.  Cardiovascular:     Rate and Rhythm: Normal rate and regular rhythm.     Pulses: Intact distal pulses.     Heart sounds: Normal heart sounds. No murmur heard. Pulmonary:     Effort: Pulmonary effort is  normal.     Breath sounds: Normal breath sounds. No wheezing or rales.         Assessment & Recommendations:   65 y.o. Caucasian male with controlled hypertension, hyperlipidemia, CAD s/p STEMI and RCA PCI 2015, paroxysmal atrial fibrillation.  Paroxysmal atrial fibrillation: Rare episodes with symptoms of palpitations. Given that he is minimally symptomatic, continued rate control therapy with metoprolol tartrate 25 mg twice daily is reasonable.    CHA2DS2VASc score of 2, and annual stroke risk of 2.2%.   Previously, I explained to the patient that presence or absence of symptoms is unrelated to his risk of stroke based on his risk factors.  Knowing the risks and benefits, he has chosen to avoid  anticoagulation at this time and would like to continue aspirin 81 mg daily for his coronary disease. In terms of etiology evaluation for atrial fibrillation, he does not have any ischemic symptoms of the time.  He would also like to hold off sleep study evaluation at this time.  Hypertension: Uncontrolled. I suggested adding amlodipine 5 mg. Avoiding ACE/ARB given his CKD. He wants to discuss with his PCP Dr. Virgina Jock about this. If he agrees, recommend starting amlodipine 5 mg.  Coronary artery disease: No angina symptoms.  Continue aspirin, statin.  Increase metoprolol dose as above.  F/u in 1 year  Nigel Mormon, MD [facility]. [place] Pager: [phone] Office: [phone]

## 2021-04-18 DIAGNOSIS — R140 Abdominal distension (gaseous): Secondary | ICD-10-CM | POA: Diagnosis not present

## 2021-04-18 DIAGNOSIS — R112 Nausea with vomiting, unspecified: Secondary | ICD-10-CM | POA: Diagnosis not present

## 2021-04-18 DIAGNOSIS — N1832 Chronic kidney disease, stage 3b: Secondary | ICD-10-CM | POA: Diagnosis not present

## 2021-04-18 DIAGNOSIS — R197 Diarrhea, unspecified: Secondary | ICD-10-CM | POA: Diagnosis not present

## 2021-04-20 DIAGNOSIS — K8020 Calculus of gallbladder without cholecystitis without obstruction: Secondary | ICD-10-CM | POA: Diagnosis not present

## 2021-04-20 DIAGNOSIS — K5730 Diverticulosis of large intestine without perforation or abscess without bleeding: Secondary | ICD-10-CM | POA: Diagnosis not present

## 2021-08-09 ENCOUNTER — Other Ambulatory Visit: Payer: Self-pay | Admitting: Cardiology

## 2021-08-12 DIAGNOSIS — E785 Hyperlipidemia, unspecified: Secondary | ICD-10-CM | POA: Diagnosis not present

## 2021-08-12 DIAGNOSIS — M109 Gout, unspecified: Secondary | ICD-10-CM | POA: Diagnosis not present

## 2021-08-12 DIAGNOSIS — R739 Hyperglycemia, unspecified: Secondary | ICD-10-CM | POA: Diagnosis not present

## 2021-08-12 DIAGNOSIS — I10 Essential (primary) hypertension: Secondary | ICD-10-CM | POA: Diagnosis not present

## 2021-08-19 DIAGNOSIS — N1832 Chronic kidney disease, stage 3b: Secondary | ICD-10-CM | POA: Diagnosis not present

## 2021-08-19 DIAGNOSIS — E785 Hyperlipidemia, unspecified: Secondary | ICD-10-CM | POA: Diagnosis not present

## 2021-08-19 DIAGNOSIS — Z1212 Encounter for screening for malignant neoplasm of rectum: Secondary | ICD-10-CM | POA: Diagnosis not present

## 2021-08-19 DIAGNOSIS — I129 Hypertensive chronic kidney disease with stage 1 through stage 4 chronic kidney disease, or unspecified chronic kidney disease: Secondary | ICD-10-CM | POA: Diagnosis not present

## 2021-08-19 DIAGNOSIS — Z0000 Encounter for general adult medical examination without abnormal findings: Secondary | ICD-10-CM | POA: Diagnosis not present

## 2021-08-19 DIAGNOSIS — R82998 Other abnormal findings in urine: Secondary | ICD-10-CM | POA: Diagnosis not present

## 2021-09-01 ENCOUNTER — Encounter: Payer: Self-pay | Admitting: Nurse Practitioner

## 2021-09-26 ENCOUNTER — Other Ambulatory Visit: Payer: Self-pay

## 2021-09-27 ENCOUNTER — Encounter: Payer: Self-pay | Admitting: Nurse Practitioner

## 2021-09-27 ENCOUNTER — Ambulatory Visit: Payer: Medicare Other | Admitting: Nurse Practitioner

## 2021-09-27 ENCOUNTER — Other Ambulatory Visit (INDEPENDENT_AMBULATORY_CARE_PROVIDER_SITE_OTHER): Payer: Medicare Other

## 2021-09-27 VITALS — BP 134/86 | HR 66 | Ht 68.0 in | Wt 196.1 lb

## 2021-09-27 DIAGNOSIS — R1011 Right upper quadrant pain: Secondary | ICD-10-CM

## 2021-09-27 DIAGNOSIS — K8020 Calculus of gallbladder without cholecystitis without obstruction: Secondary | ICD-10-CM | POA: Diagnosis not present

## 2021-09-27 DIAGNOSIS — R197 Diarrhea, unspecified: Secondary | ICD-10-CM

## 2021-09-27 DIAGNOSIS — R140 Abdominal distension (gaseous): Secondary | ICD-10-CM

## 2021-09-27 LAB — CBC WITH DIFFERENTIAL/PLATELET
Basophils Absolute: 0 10*3/uL (ref 0.0–0.1)
Basophils Relative: 0.6 % (ref 0.0–3.0)
Eosinophils Absolute: 0.2 10*3/uL (ref 0.0–0.7)
Eosinophils Relative: 3.4 % (ref 0.0–5.0)
HCT: 46.4 % (ref 39.0–52.0)
Hemoglobin: 15.4 g/dL (ref 13.0–17.0)
Lymphocytes Relative: 17.2 % (ref 12.0–46.0)
Lymphs Abs: 1.1 10*3/uL (ref 0.7–4.0)
MCHC: 33.1 g/dL (ref 30.0–36.0)
MCV: 81.9 fl (ref 78.0–100.0)
Monocytes Absolute: 0.6 10*3/uL (ref 0.1–1.0)
Monocytes Relative: 8.8 % (ref 3.0–12.0)
Neutro Abs: 4.5 10*3/uL (ref 1.4–7.7)
Neutrophils Relative %: 70 % (ref 43.0–77.0)
Platelets: 190 10*3/uL (ref 150.0–400.0)
RBC: 5.66 Mil/uL (ref 4.22–5.81)
RDW: 15.3 % (ref 11.5–15.5)
WBC: 6.4 10*3/uL (ref 4.0–10.5)

## 2021-09-27 LAB — COMPREHENSIVE METABOLIC PANEL
ALT: 37 U/L (ref 0–53)
AST: 28 U/L (ref 0–37)
Albumin: 4.6 g/dL (ref 3.5–5.2)
Alkaline Phosphatase: 77 U/L (ref 39–117)
BUN: 22 mg/dL (ref 6–23)
CO2: 23 mEq/L (ref 19–32)
Calcium: 10.1 mg/dL (ref 8.4–10.5)
Chloride: 104 mEq/L (ref 96–112)
Creatinine, Ser: 1.55 mg/dL — ABNORMAL HIGH (ref 0.40–1.50)
GFR: 46.55 mL/min — ABNORMAL LOW (ref >60.00)
Glucose, Bld: 178 mg/dL — ABNORMAL HIGH (ref 70–99)
Potassium: 4.6 mEq/L (ref 3.5–5.1)
Sodium: 135 mEq/L (ref 135–145)
Total Bilirubin: 0.6 mg/dL (ref 0.2–1.2)
Total Protein: 7.3 g/dL (ref 6.0–8.3)

## 2021-09-27 LAB — C-REACTIVE PROTEIN: CRP: <1 mg/dL (ref 0.5–20.0)

## 2021-09-27 LAB — FECAL OCCULT BLOOD, IMMUNOCHEMICAL: IFOBT: NEGATIVE

## 2021-09-27 NOTE — Progress Notes (Signed)
Note reviewed.  Happy to have him is my patient.  Thanks

## 2021-09-27 NOTE — Patient Instructions (Signed)
PROCEDURES: You have been scheduled for an EGD and Colonoscopy. Please follow the written instructions given to you at your visit today. If you use inhalers (even only as needed), please bring them with you on the day of your procedure.  LABS:  Lab work has been ordered for you today. Our lab is located in the basement. Press "B" on the elevator. The lab is located at the first door on the left as you exit the elevator.  HEALTHCARE LAWS AND MY CHART RESULTS: Due to recent changes in healthcare laws, you may see the results of your imaging and laboratory studies on MyChart before your provider has had a chance to review them.   We understand that in some cases there may be results that are confusing or concerning to you. Not all laboratory results come back in the same time frame and the provider may be waiting for multiple results in order to interpret others.  Please give [place] 48 hours in order for your provider to thoroughly review all the results before contacting the office for clarification of your results.   ULTRASOUND You have been scheduled for an abdominal ultrasound at [facility] (1st floor of hospital) on 10/04/21 at 9:30 AM.   Please arrive 30 minutes prior to your appointment for registration.  Make certain not to have anything to eat or drink after midnight prior to your appointment.  Should you need to reschedule your appointment, please contact radiology at [phone]. This test typically takes about 30 minutes to perform.  RECOMMENDATIONS: Over the counter Pepcid 20 MG tablet once a day as needed. Florastor Probiotic 1 capsule twice a day. Follow a low fat diet.  It was great seeing you today! Thank you for entrusting me with your care and choosing [facility].  Noralyn Pick, CRNP  The [facility] providers would like to encourage you to use [facility] to communicate with providers for non-urgent requests or questions.  Due to long hold times on  the telephone, sending your provider a message by [facility] may be faster and more efficient way to get a response. Please allow 48 business hours for a response.  Please remember that this is for non-urgent requests/questions.  If you are age 66 or older, your body mass index should be between 23-30. Your Body mass index is 29.82 kg/m. If this is out of the aforementioned range listed, please consider follow up with your Primary Care Provider.

## 2021-09-27 NOTE — Progress Notes (Signed)
09/27/2021 Dwayne Larsen NE:[id number] 04-14-1956   CHIEF COMPLAINT: Diarrhea, abdominal bloat  HISTORY OF PRESENT ILLNESS: Dwayne Larsen is a 66 year old male with a past medical history coronary artery disease s/p STEMI s/p DES 2015, paroxysmal atrial fibrillation, hypercholesterolemia, kidney stones, BPH, gout, CKD, hepatic steatosis, acute sigmoid diverticulitis 05/2019, gallstones and colon polyps. He presents to our office today as referred by Dr. Shon Baton for further evaluation regarding diarrheae and abdominal bloat. He developed abrupt onset of diarrhea approximately 6 to 8 months ago. He recalled the first day of diarrhea occurred after he ate dinner at a restaurant. He awakened around 2am the next morning with urgent frequent nonbloody diarrhea, numerous episodes which lasted for a few days then slowed down after he took Imodium. He would have constipation for a few days then back to diarrhea and the cycle would repeat. Eventually, his diarrhea decreased to 2 to 3 episodes weekly and for the past month he's experienced diarrhea one weekly. He passed 3 to 4 loose stools this morning. He traveled out of town once monthly from May to August 2022 and during his time of travel he had normal stools, no diarrhea. He also reported feeling significantly bloated since his diarrhea started and often feels as if food just sits in his stomach. Increased belching and gas per the rectum. No N/V. He has heartburn about 2 times weekly for years. Never had an EGD.  He underwent 2 colonoscopies in his life time. His most recent colonoscopy by Dr. Richmond Campbell was in 2018 which identified a 43mm polyp removed from the sigmoid colon and sigmoid diverticulosis (polyp in the diverticulum with purulent exudate ? treated with antibiotics). Path report showed polypoid fragments with benign colonic mucosa/inflammatory polyp and a repeat colonoscopy in 10 years was recommended. No known family history of IBD or colorectal  cancer. No recent antibiotic use. He started taking a probiotic and digestive enzyme several months ago.   Her reported having blood tests and stool cultures per his PCP 08/2021 which were reported as normal.    In review of his records in care everywhere, he underwent a CTAP wo contrast at [facility] 04/20/2021 which showed diverticulosis, cholelithiasis and no acute findings to explain his N/V and abdominal pain.   He underwent a [facility] HIDA scan 08/16/2020 which showed a gallbladder EF of 5% which is consistent with gallbladder dyskinesia.   CTAP wo contrast 05/22/2019 showed acute mid sigmoid diverticulitis, hepatic steatosis and a distended urinary bladder with moderate to severe bilateral hydroureteronephrosis secondary to urinary retention from bladder outlet obstruction.    Past Medical History:  Diagnosis Date   Abdominal pain, left lower quadrant    Allergy, unspecified not elsewhere classified    BPH (benign prostatic hyperplasia)    CAD (coronary artery disease) March 07, 2014   3.0 x 18 mm Xience drug-eluting stent.  The stent was postdilated with a 3.25 noncompliant balloon.   DJD (degenerative joint disease)    Elevated prostate specific antigen (PSA)    Gout    History of kidney stones    Hypercholesteremia    Lumbar back pain    Palpitations    PONV (postoperative nausea and vomiting)    nausea   Anxious after awakening, nausea.    Past Surgical History:  Procedure Laterality Date   CYSTOSCOPY WITH LITHOLAPAXY N/A 01/13/2020   Procedure: CYSTOSCOPY WITH LITHOLAPAXY;  Surgeon: Cleon Gustin, MD;  Location: [facility];  Service:  Urology;  Laterality: N/A;   KNEE ARTHROSCOPY     right   LAMINECTOMY AND MICRODISCECTOMY LUMBAR SPINE  2003   L4-5   LEFT HEART CATHETERIZATION WITH CORONARY ANGIOGRAM N/A 03/07/2014   Procedure: LEFT HEART CATHETERIZATION WITH CORONARY ANGIOGRAM;  Surgeon: Wellington Hampshire, MD;  Location: [facility] CATH LAB;  Service:  Cardiovascular;  Laterality: N/A;   PERCUTANEOUS CORONARY STENT INTERVENTION (PCI-S)  03/07/2014   Procedure: PERCUTANEOUS CORONARY STENT INTERVENTION (PCI-S);  Surgeon: Wellington Hampshire, MD;  Location: [facility] CATH LAB;  Service: Cardiovascular;;   RECTAL SURGERY  2003   Dr. Deon Pilling   TRANSURETHRAL RESECTION OF PROSTATE N/A 01/13/2020   Procedure: TRANSURETHRAL RESECTION OF THE PROSTATE (TURP);  Surgeon: Cleon Gustin, MD;  Location: [facility];  Service: Urology;  Laterality: N/A;   Social History: He is married. He smoked cigarettes as a teenager. He drinks a 2 beers monthly or less. No drug use.   Family History: Father had CAD, DVT, prostate cancer, part of his colon removed (not cancer) . Mother with history of diabetes and diverticulitis. Paternal grandmother possibly had lung cancer.   Allergies  Allergen Reactions   Other Itching and Rash    Walnuts and pecans   Penicillins Other (See Comments)    Did it involve swelling of the face/tongue/throat, SOB, or low BP?No Did it involve sudden or severe rash/hives, skin peeling, or any reaction on the inside of your mouth or nose? yes Did you need to seek medical attention at a hospital or doctor's office? no When did it last happen? 66yrs old      If all above answers are NO, may proceed with cephalosporin use.       Outpatient Encounter Medications as of 09/27/2021  Medication Sig   aspirin 81 MG tablet Take 81 mg by mouth daily.     atorvastatin (LIPITOR) 80 MG tablet TAKE 1 TABLET BY MOUTH DAILY AT 6 PM (Patient taking differently: Take 80 mg by mouth every evening.)   fenofibrate 160 MG tablet TAKE 1 TABLET(160 MG) BY MOUTH DAILY   metoprolol tartrate (LOPRESSOR) 25 MG tablet TAKE 1 TABLET BY MOUTH TWICE DAILY   Multiple Vitamin (MULTIVITAMIN WITH MINERALS) TABS tablet Take 1 tablet by mouth daily.   nitroGLYCERIN (NITROSTAT) 0.4 MG SL tablet Place 1 tablet (0.4 mg total) under the tongue every 5 (five) minutes  as needed for chest pain.   zolpidem (AMBIEN) 10 MG tablet Take 10 mg by mouth at bedtime as needed for sleep.   No facility-administered encounter medications on file as of 09/27/2021.   REVIEW OF SYSTEMS:  Gen: Denies fever, sweats or chills. No weight loss.  CV: Denies chest pain, palpitations or edema. Resp: Denies cough, shortness of breath of hemoptysis.  GI: See HPI.  GU : Denies urinary burning, blood in urine, increased urinary frequency or incontinence. MS: Denies joint pain, muscles aches or weakness. Derm: Denies rash, itchiness, skin lesions or unhealing ulcers. Psych: Denies depression, anxiety or memory loss.  Heme: Denies bruising, bleeding. Neuro:  Denies headaches, dizziness or paresthesias. Endo:  Denies any problems with DM, thyroid or adrenal function.  PHYSICAL EXAM: There were no vitals taken for this visit. BP 134/86    Pulse 66    Ht 5\' 8"  (1.727 m)    Wt 196 lb 2 oz (89 kg)    SpO2 97%    BMI 29.82 kg/m   General: 66 year old male in NAD.  Head: Normocephalic and atraumatic.  Eyes:  Sclerae non-icteric, conjunctive pink. Ears: Normal auditory acuity. Mouth: Dentition intact. No ulcers or lesions.  Neck: Supple, no lymphadenopathy or thyromegaly.  Lungs: Clear bilaterally to auscultation without wheezes, crackles or rhonchi. Heart: Regular rate and rhythm. No murmur, rub or gallop appreciated.  Abdomen: Soft, mild tenderness RUQ. Right hepatomegaly vs RUQ mass. No Splenomegaly. Normoactive bowel sounds x 4 quadrants.  Rectal: Deferred.  Musculoskeletal: Symmetrical with no gross deformities. Skin: Warm and dry. No rash or lesions on visible extremities. Extremities: No edema. Neurological: Alert oriented x 4, no focal deficits.  Psychological:  Alert and cooperative. Normal mood and affect.  ASSESSMENT AND PLAN:  57) 66 year old male with diarrhea, abrupt onset 6 to 8 months ago which has improved on probiotics and Imodium PRN but has not completed  abated. Patient reported stools studies done by PCP 08/2021 were negative. Suspect prolonged course of post infectious IBS vs possible colitis.  -Florastor probiotic  -CBC, CMP and CRP level  -Requested labs and stool cultures done by PCP 08/2021 -Diagnostic colonoscopy to rule out colitis benefits and risks discussed including risk with sedation, risk of bleeding, perforation and infection   2) Heartburn 2 to 3 days weekly for years, relived by TUMS. Never had an EGD. -EGD benefits and risks discussed including risk with sedation, risk of bleeding, perforation and infection  -Pepcid 20mg  po QD OTC  3) Abdominal bloat with upper abdominal fullness (onset occurred same time of diarrhea). Post infectious gastroparesis vs PUD vs gallbladder etiology.  -EGD as noted above  4) Gallstones vs adenomyomatosis per RUQ sono 01/2020, gallstones per noncontrast CT 04/2021. Gallbladder dyskinesia per [facility] HIDA scan (Ensure used) 07/2020.  -Avoid fatty foods  5) History of hepatic steatosis. RUQ mass vs hepatomegaly on exam, area was tender to palpation.  -RUQ sonogram to evaluate the liver and gallbladder -Labs as ordered above  6) History of a single inflammatory sigmoid polyp per colonoscopy by Dr. Earlean Shawl in 2018 with recommended recall colonoscopy 10 years.   7) History of mid sigmoid diverticulitis per CT wo contrast 05/2019  8) Coronary artery disease s/p STEMI, s/p DES 2015 on Metoprolol, statin and ASA. Last seen by cardiologist Dr. Virgina Jock 02/2021, cardiac status was stable at that time.   9) Paroxysmal atrial fibrillation on Metoprolol and ASA, not on anticoagulation per patient's preference. CHA2DS2VASc score of 2.  10) CKD    CC:  Shon Baton, MD

## 2021-10-04 ENCOUNTER — Other Ambulatory Visit: Payer: Self-pay

## 2021-10-04 ENCOUNTER — Ambulatory Visit (HOSPITAL_COMMUNITY)
Admission: RE | Admit: 2021-10-04 | Discharge: 2021-10-04 | Disposition: A | Discharge status: Home / Self Care | Payer: Medicare Other | Source: Ambulatory Visit | Attending: Nurse Practitioner | Admitting: Nurse Practitioner

## 2021-10-04 DIAGNOSIS — R140 Abdominal distension (gaseous): Secondary | ICD-10-CM

## 2021-10-04 DIAGNOSIS — R1011 Right upper quadrant pain: Secondary | ICD-10-CM

## 2021-10-04 DIAGNOSIS — K8020 Calculus of gallbladder without cholecystitis without obstruction: Secondary | ICD-10-CM

## 2021-10-04 DIAGNOSIS — R197 Diarrhea, unspecified: Secondary | ICD-10-CM | POA: Diagnosis not present

## 2021-10-04 DIAGNOSIS — R160 Hepatomegaly, not elsewhere classified: Secondary | ICD-10-CM | POA: Diagnosis not present

## 2021-10-05 ENCOUNTER — Other Ambulatory Visit: Payer: Self-pay

## 2021-10-05 DIAGNOSIS — R935 Abnormal findings on diagnostic imaging of other abdominal regions, including retroperitoneum: Secondary | ICD-10-CM

## 2021-10-31 ENCOUNTER — Ambulatory Visit
Admission: RE | Admit: 2021-10-31 | Discharge: 2021-10-31 | Disposition: A | Discharge status: Home / Self Care | Payer: Medicare Other | Source: Ambulatory Visit | Attending: Nurse Practitioner | Admitting: Nurse Practitioner

## 2021-10-31 DIAGNOSIS — K3189 Other diseases of stomach and duodenum: Secondary | ICD-10-CM | POA: Diagnosis not present

## 2021-10-31 DIAGNOSIS — N281 Cyst of kidney, acquired: Secondary | ICD-10-CM | POA: Diagnosis not present

## 2021-10-31 DIAGNOSIS — K6389 Other specified diseases of intestine: Secondary | ICD-10-CM | POA: Diagnosis not present

## 2021-10-31 DIAGNOSIS — K8020 Calculus of gallbladder without cholecystitis without obstruction: Secondary | ICD-10-CM | POA: Diagnosis not present

## 2021-10-31 DIAGNOSIS — R935 Abnormal findings on diagnostic imaging of other abdominal regions, including retroperitoneum: Secondary | ICD-10-CM

## 2021-10-31 MED ORDER — GADOBENATE DIMEGLUMINE 529 MG/ML IV SOLN
17.0000 mL | Freq: Once | INTRAVENOUS | Status: AC | PRN
  Administered 2021-10-31: 17 mL via INTRAVENOUS

## 2021-11-03 ENCOUNTER — Encounter: Payer: Self-pay | Admitting: Internal Medicine

## 2021-11-10 ENCOUNTER — Encounter: Payer: Self-pay | Admitting: Internal Medicine

## 2021-11-10 ENCOUNTER — Other Ambulatory Visit: Payer: Self-pay

## 2021-11-10 ENCOUNTER — Ambulatory Visit (AMBULATORY_SURGERY_CENTER): Payer: Medicare Other | Admitting: Internal Medicine

## 2021-11-10 VITALS — BP 124/68 | HR 50 | Temp 97.3°F | Resp 10 | Ht 68.0 in | Wt 196.0 lb

## 2021-11-10 DIAGNOSIS — R109 Unspecified abdominal pain: Secondary | ICD-10-CM | POA: Diagnosis not present

## 2021-11-10 DIAGNOSIS — R197 Diarrhea, unspecified: Secondary | ICD-10-CM | POA: Diagnosis not present

## 2021-11-10 DIAGNOSIS — K2970 Gastritis, unspecified, without bleeding: Secondary | ICD-10-CM | POA: Diagnosis not present

## 2021-11-10 DIAGNOSIS — R1032 Left lower quadrant pain: Secondary | ICD-10-CM

## 2021-11-10 DIAGNOSIS — K2100 Gastro-esophageal reflux disease with esophagitis, without bleeding: Secondary | ICD-10-CM | POA: Diagnosis not present

## 2021-11-10 DIAGNOSIS — K209 Esophagitis, unspecified without bleeding: Secondary | ICD-10-CM | POA: Diagnosis not present

## 2021-11-10 DIAGNOSIS — K2950 Unspecified chronic gastritis without bleeding: Secondary | ICD-10-CM | POA: Diagnosis not present

## 2021-11-10 DIAGNOSIS — I10 Essential (primary) hypertension: Secondary | ICD-10-CM | POA: Diagnosis not present

## 2021-11-10 DIAGNOSIS — K5730 Diverticulosis of large intestine without perforation or abscess without bleeding: Secondary | ICD-10-CM | POA: Diagnosis not present

## 2021-11-10 DIAGNOSIS — R140 Abdominal distension (gaseous): Secondary | ICD-10-CM

## 2021-11-10 DIAGNOSIS — R1310 Dysphagia, unspecified: Secondary | ICD-10-CM | POA: Diagnosis not present

## 2021-11-10 DIAGNOSIS — R935 Abnormal findings on diagnostic imaging of other abdominal regions, including retroperitoneum: Secondary | ICD-10-CM

## 2021-11-10 DIAGNOSIS — R1011 Right upper quadrant pain: Secondary | ICD-10-CM

## 2021-11-10 DIAGNOSIS — I2510 Atherosclerotic heart disease of native coronary artery without angina pectoris: Secondary | ICD-10-CM | POA: Diagnosis not present

## 2021-11-10 MED ORDER — SODIUM CHLORIDE 0.9 % IV SOLN: 500.0000 mL | Freq: Once | INTRAVENOUS | Status: DC

## 2021-11-10 MED ORDER — PANTOPRAZOLE SODIUM 40 MG PO TBEC: 40.0000 mg | DELAYED_RELEASE_TABLET | Freq: Every day | ORAL | 3 refills | Status: DC

## 2021-11-10 NOTE — Op Note (Signed)
[facility] Patient Name: Dwayne Larsen Procedure Date: 11/10/2021 3:26 PM MRN: [id number] Endoscopist: Docia Chuck. Henrene Pastor , MD Age: 66 Referring MD:  Date of Birth: 08-21-1956 Gender: Male Account #: [id number] Procedure:                Colonoscopy with biopsies Indications:              Abdominal pain, Clinically significant diarrhea of                            unexplained origin. This has improved over time.                            Previous colonoscopy elsewhere 2018 was negative                            for neoplasia Medicines:                Monitored Anesthesia Care Procedure:                Pre-Anesthesia Assessment:                           - Prior to the procedure, a History and Physical                            was performed, and patient medications and                            allergies were reviewed. The patient's tolerance of                            previous anesthesia was also reviewed. The risks                            and benefits of the procedure and the sedation                            options and risks were discussed with the patient.                            All questions were answered, and informed consent                            was obtained. Prior Anticoagulants: The patient has                            taken no previous anticoagulant or antiplatelet                            agents. ASA Grade Assessment: II - A patient with                            mild systemic disease. After reviewing the risks  and benefits, the patient was deemed in                            satisfactory condition to undergo the procedure.                           After obtaining informed consent, the colonoscope                            was passed under direct vision. Throughout the                            procedure, the patient's blood pressure, pulse, and                            oxygen saturations were monitored  continuously. The                            Olympus CF-HQ190L [id number]) Colonoscope was                            introduced through the anus and advanced to the the                            cecum, identified by appendiceal orifice and                            ileocecal valve. The ileocecal valve, appendiceal                            orifice, and rectum were photographed. The quality                            of the bowel preparation was excellent. The                            colonoscopy was performed without difficulty. The                            patient tolerated the procedure well. The bowel                            preparation used was Miralax via split dose                            instruction. Scope In: 3:45:07 PM Scope Out: 4:01:35 PM Scope Withdrawal Time: 0 hours 14 minutes 15 seconds  Total Procedure Duration: 0 hours 16 minutes 28 seconds  Findings:                 The terminal ileum appeared normal (limited to                            distal 2 cm).  Many small and large-mouthed diverticula were found                            in the left colon.                           The entire examined colon appeared otherwise normal                            on direct and retroflexion views. Biopsies for                            histology were taken with a cold forceps from the                            entire colon for evaluation of microscopic colitis. Complications:            No immediate complications. Estimated blood loss:                            None. Estimated Blood Loss:     Estimated blood loss: none. Impression:               - The examined portion of the ileum was normal.                           - Marked diverticulosis in the left colon.                           - The entire examined colon is normal on direct and                            retroflexion views. Recommendation:           1. Repeat colonoscopy in 10  years for screening                            purposes.                           2. Recommend Citrucel 1 to 2 tablespoons daily.                            This fortified fiber will improve bowel consistency                            and may be beneficial for diverticular disease.                           3. Await pathology results.                           4. Please see upper endoscopy regarding findings                            and final/additional recommendations Dwayne Larsen N. Henrene Pastor,  MD 11/10/2021 4:24:19 PM This report has been signed electronically.

## 2021-11-10 NOTE — Progress Notes (Signed)
Report to PACU, RN, vss, BBS= Clear.  

## 2021-11-10 NOTE — Progress Notes (Signed)
CHIEF COMPLAINT: Diarrhea, abdominal bloat   HISTORY OF PRESENT ILLNESS: Dwayne Larsen is a 66 year old male with a past medical history coronary artery disease s/p STEMI s/p DES 2015, paroxysmal atrial fibrillation, hypercholesterolemia, kidney stones, BPH, gout, CKD, hepatic steatosis, acute sigmoid diverticulitis 05/2019, gallstones and colon polyps. He presents to our office today as referred by Dr. Shon Baton for further evaluation regarding diarrheae and abdominal bloat. He developed abrupt onset of diarrhea approximately 6 to 8 months ago. He recalled the first day of diarrhea occurred after he ate dinner at a restaurant. He awakened around 2am the next morning with urgent frequent nonbloody diarrhea, numerous episodes which lasted for a few days then slowed down after he took Imodium. He would have constipation for a few days then back to diarrhea and the cycle would repeat. Eventually, his diarrhea decreased to 2 to 3 episodes weekly and for the past month he's experienced diarrhea one weekly. He passed 3 to 4 loose stools this morning. He traveled out of town once monthly from May to August 2022 and during his time of travel he had normal stools, no diarrhea. He also reported feeling significantly bloated since his diarrhea started and often feels as if food just sits in his stomach. Increased belching and gas per the rectum. No N/V. He has heartburn about 2 times weekly for years. Never had an EGD.  He underwent 2 colonoscopies in his life time. His most recent colonoscopy by Dr. Richmond Campbell was in 2018 which identified a 79mm polyp removed from the sigmoid colon and sigmoid diverticulosis (polyp in the diverticulum with purulent exudate ? treated with antibiotics). Path report showed polypoid fragments with benign colonic mucosa/inflammatory polyp and a repeat colonoscopy in 10 years was recommended. No known family history of IBD or colorectal cancer. No recent antibiotic use. He started taking a  probiotic and digestive enzyme several months ago.    Her reported having blood tests and stool cultures per his PCP 08/2021 which were reported as normal.     In review of his records in care everywhere, he underwent a CTAP wo contrast at [facility] 04/20/2021 which showed diverticulosis, cholelithiasis and no acute findings to explain his N/V and abdominal pain.    He underwent a [facility] HIDA scan 08/16/2020 which showed a gallbladder EF of 5% which is consistent with gallbladder dyskinesia.    CTAP wo contrast 05/22/2019 showed acute mid sigmoid diverticulitis, hepatic steatosis and a distended urinary bladder with moderate to severe bilateral hydroureteronephrosis secondary to urinary retention from bladder outlet obstruction.          Past Medical History:  Diagnosis Date   Abdominal pain, left lower quadrant     Allergy, unspecified not elsewhere classified     BPH (benign prostatic hyperplasia)     CAD (coronary artery disease) March 07, 2014    3.0 x 18 mm Xience drug-eluting stent.  The stent was postdilated with a 3.25 noncompliant balloon.   DJD (degenerative joint disease)     Elevated prostate specific antigen (PSA)     Gout     History of kidney stones     Hypercholesteremia     Lumbar back pain     Palpitations     PONV (postoperative nausea and vomiting)      nausea    Anxious after awakening, nausea.           Past Surgical History:  Procedure Laterality Date   CYSTOSCOPY WITH LITHOLAPAXY N/A  01/13/2020    Procedure: CYSTOSCOPY WITH LITHOLAPAXY;  Surgeon: Cleon Gustin, MD;  Location: [facility];  Service: Urology;  Laterality: N/A;   KNEE ARTHROSCOPY        right   LAMINECTOMY AND MICRODISCECTOMY LUMBAR SPINE   2003    L4-5   LEFT HEART CATHETERIZATION WITH CORONARY ANGIOGRAM N/A 03/07/2014    Procedure: LEFT HEART CATHETERIZATION WITH CORONARY ANGIOGRAM;  Surgeon: Wellington Hampshire, MD;  Location: [facility] CATH [facility];  Service: Cardiovascular;   Laterality: N/A;   PERCUTANEOUS CORONARY STENT INTERVENTION (PCI-S)   03/07/2014    Procedure: PERCUTANEOUS CORONARY STENT INTERVENTION (PCI-S);  Surgeon: Wellington Hampshire, MD;  Location: [facility] CATH LAB;  Service: Cardiovascular;;   RECTAL SURGERY   2003    Dr. Deon Pilling   TRANSURETHRAL RESECTION OF PROSTATE N/A 01/13/2020    Procedure: TRANSURETHRAL RESECTION OF THE PROSTATE (TURP);  Surgeon: Cleon Gustin, MD;  [facility];  Service: Urology;  Laterality: N/A;    Social History: He is married. He smoked cigarettes as a teenager. He drinks a 2 beers monthly or less. No drug use.    Family History: Father had CAD, DVT, prostate cancer, part of his colon removed (not cancer) . Mother with history of diabetes and diverticulitis. Paternal grandmother possibly had lung cancer.         Allergies  Allergen Reactions   Other Itching and Rash      Walnuts and pecans   Penicillins Other (See Comments)      Did it involve swelling of the face/tongue/throat, SOB, or low BP?No Did it involve sudden or severe rash/hives, skin peeling, or any reaction on the inside of your mouth or nose? yes Did you need to seek medical attention at a hospital or doctor's office? no When did it last happen? 66yrs old      If all above answers are NO, may proceed with cephalosporin use.              Outpatient Encounter Medications as of 09/27/2021  Medication Sig   aspirin 81 MG tablet Take 81 mg by mouth daily.     atorvastatin (LIPITOR) 80 MG tablet TAKE 1 TABLET BY MOUTH DAILY AT 6 PM (Patient taking differently: Take 80 mg by mouth every evening.)   fenofibrate 160 MG tablet TAKE 1 TABLET(160 MG) BY MOUTH DAILY   metoprolol tartrate (LOPRESSOR) 25 MG tablet TAKE 1 TABLET BY MOUTH TWICE DAILY   Multiple Vitamin (MULTIVITAMIN WITH MINERALS) TABS tablet Take 1 tablet by mouth daily.   nitroGLYCERIN (NITROSTAT) 0.4 MG SL tablet Place 1 tablet (0.4 mg total) under the tongue every 5 (five)  minutes as needed for chest pain.   zolpidem (AMBIEN) 10 MG tablet Take 10 mg by mouth at bedtime as needed for sleep.    No facility-administered encounter medications on file as of 09/27/2021.    REVIEW OF SYSTEMS:  Gen: Denies fever, sweats or chills. No weight loss.  CV: Denies chest pain, palpitations or edema. Resp: Denies cough, shortness of breath of hemoptysis.  GI: See HPI.  GU : Denies urinary burning, blood in urine, increased urinary frequency or incontinence. MS: Denies joint pain, muscles aches or weakness. Derm: Denies rash, itchiness, skin lesions or unhealing ulcers. Psych: Denies depression, anxiety or memory loss.  Heme: Denies bruising, bleeding. Neuro:  Denies headaches, dizziness or paresthesias. Endo:  Denies any problems with DM, thyroid or adrenal function.   PHYSICAL EXAM: There were no  vitals taken for this visit. BP 134/86    Pulse 66    Ht 5\' 8"  (1.727 m)    Wt 196 lb 2 oz (89 kg)    SpO2 97%    BMI 29.82 kg/m    General: 66 year old male in NAD.  Head: Normocephalic and atraumatic. Eyes:  Sclerae non-icteric, conjunctive pink. Ears: Normal auditory acuity. Mouth: Dentition intact. No ulcers or lesions.  Neck: Supple, no lymphadenopathy or thyromegaly.  Lungs: Clear bilaterally to auscultation without wheezes, crackles or rhonchi. Heart: Regular rate and rhythm. No murmur, rub or gallop appreciated.  Abdomen: Soft, mild tenderness RUQ. Right hepatomegaly vs RUQ mass. No Splenomegaly. Normoactive bowel sounds x 4 quadrants.  Rectal: Deferred.  Musculoskeletal: Symmetrical with no gross deformities. Skin: Warm and dry. No rash or lesions on visible extremities. Extremities: No edema. Neurological: Alert oriented x 4, no focal deficits.  Psychological:  Alert and cooperative. Normal mood and affect.   ASSESSMENT AND PLAN:   93) 66 year old male with diarrhea, abrupt onset 6 to 8 months ago which has improved on probiotics and Imodium PRN but has not  completed abated. Patient reported stools studies done by PCP 08/2021 were negative. Suspect prolonged course of post infectious IBS vs possible colitis.  -Florastor probiotic  -CBC, CMP and CRP level  -Requested labs and stool cultures done by PCP 08/2021 -Diagnostic colonoscopy to rule out colitis benefits and risks discussed including risk with sedation, risk of bleeding, perforation and infection    2) Heartburn 2 to 3 days weekly for years, relived by TUMS. Never had an EGD. -EGD benefits and risks discussed including risk with sedation, risk of bleeding, perforation and infection  -Pepcid 20mg  po QD OTC   3) Abdominal bloat with upper abdominal fullness (onset occurred same time of diarrhea). Post infectious gastroparesis vs PUD vs gallbladder etiology.  -EGD as noted above   4) Gallstones vs adenomyomatosis per RUQ sono 01/2020, gallstones per noncontrast CT 04/2021. Gallbladder dyskinesia per [facility] HIDA scan (Ensure used) 07/2020.  -Avoid fatty foods   5) History of hepatic steatosis. RUQ mass vs hepatomegaly on exam, area was tender to palpation.  -RUQ sonogram to evaluate the liver and gallbladder -Labs as ordered above   6) History of a single inflammatory sigmoid polyp per colonoscopy by Dr. Earlean Shawl in 2018 with recommended recall colonoscopy 10 years.    7) History of mid sigmoid diverticulitis per CT wo contrast 05/2019   8) Coronary artery disease s/p STEMI, s/p DES 2015 on Metoprolol, statin and ASA. Last seen by cardiologist Dr. Virgina Jock 02/2021, cardiac status was stable at that time.    9) Paroxysmal atrial fibrillation on Metoprolol and ASA, not on anticoagulation per patient's preference. CHA2DS2VASc score of 2.   10) CKD  Patient was seen in the office as outlined above.  No interval change.  His diarrhea continues to improve.  He does have some occasional dysphagia.  He is now for colonoscopy and upper endoscopy with possible esophageal dilation.

## 2021-11-10 NOTE — Progress Notes (Signed)
VS-CW 

## 2021-11-10 NOTE — Progress Notes (Signed)
Called to room to assist during endoscopic procedure.  Patient ID and intended procedure confirmed with present staff. Received instructions for my participation in the procedure from the performing physician.  

## 2021-11-10 NOTE — Patient Instructions (Signed)
Thank you for allowing [place] to care for you today. Await final biopsies, approximately 2 weeks. Will make recommendations at that time if applicable. To heal ulcers and control acid reflux , please start Pantoprazole 40 mg daily.  Prescription sent to your pharmacy. Recommend taking Citrucel 1-2 tablespoons daily mixed in 12 ounces of water.  It will help improve bowel consistency and is beneficial for diverticulosis. Recommend next routine screening colonoscopy in 10 years.  YOU HAD AN ENDOSCOPIC PROCEDURE TODAY AT THE [facility]:   Refer to the procedure report that was given to you for any specific questions about what was found during the examination.  If the procedure report does not answer your questions, please call your gastroenterologist to clarify.  If you requested that your care partner not be given the details of your procedure findings, then the procedure report has been included in a sealed envelope for you to review at your convenience later.  YOU SHOULD EXPECT: Some feelings of bloating in the abdomen. Passage of more gas than usual.  Walking can help get rid of the air that was put into your GI tract during the procedure and reduce the bloating. If you had a lower endoscopy (such as a colonoscopy or flexible sigmoidoscopy) you may notice spotting of blood in your stool or on the toilet paper. If you underwent a bowel prep for your procedure, you may not have a normal bowel movement for a few days.  Please Note:  You might notice some irritation and congestion in your nose or some drainage.  This is from the oxygen used during your procedure.  There is no need for concern and it should clear up in a day or so.  SYMPTOMS TO REPORT IMMEDIATELY:  Following lower endoscopy (colonoscopy or flexible sigmoidoscopy):  Excessive amounts of blood in the stool  Significant tenderness or worsening of abdominal pains  Swelling of the abdomen that is new, acute  Fever of 100F or  higher  Following upper endoscopy (EGD)  Vomiting of blood or coffee ground material  New chest pain or pain under the shoulder blades  Painful or persistently difficult swallowing  New shortness of breath  Fever of 100F or higher  Black, tarry-looking stools  For urgent or emergent issues, a gastroenterologist can be reached at any hour by calling [phone]. Do not use MyChart messaging for urgent concerns.    DIET:  We do recommend a small meal at first, but then you may proceed to your regular diet.  Drink plenty of fluids but you should avoid alcoholic beverages for 24 hours.  ACTIVITY:  You should plan to take it easy for the rest of today and you should NOT DRIVE or use heavy machinery until tomorrow (because of the sedation medicines used during the test).    FOLLOW UP: Our staff will call the number listed on your records 48-72 hours following your procedure to check on you and address any questions or concerns that you may have regarding the information given to you following your procedure. If we do not reach you, we will leave a message.  We will attempt to reach you two times.  During this call, we will ask if you have developed any symptoms of COVID 19. If you develop any symptoms (ie: fever, flu-like symptoms, shortness of breath, cough etc.) before then, please call [phone].  If you test positive for Covid 19 in the 2 weeks post procedure, please call and report this information to  [place].    If any biopsies were taken you will be contacted by phone or by letter within the next 1-3 weeks.  Please call us at [phone] if you have not heard about the biopsies in 3 weeks.    SIGNATURES/CONFIDENTIALITY: You and/or your care partner have signed paperwork which will be entered into your electronic medical record.  These signatures attest to the fact that that the information above on your After Visit Summary has been reviewed and is understood.  Full responsibility of  the confidentiality of this discharge information lies with you and/or your care-partner.

## 2021-11-10 NOTE — Op Note (Signed)
[facility] Patient Name: Dwayne Larsen Procedure Date: 11/10/2021 3:25 PM MRN: [id number] Endoscopist: Docia Chuck. Henrene Pastor , MD Age: 66 Referring MD:  Date of Birth: May 22, 1956 Gender: Male Account #: [id number] Procedure:                Upper GI endoscopy with biopsies Indications:              Abdominal pain, Dysphagia, Esophageal reflux Medicines:                Monitored Anesthesia Care Procedure:                Pre-Anesthesia Assessment:                           - Prior to the procedure, a History and Physical                            was performed, and patient medications and                            allergies were reviewed. The patient's tolerance of                            previous anesthesia was also reviewed. The risks                            and benefits of the procedure and the sedation                            options and risks were discussed with the patient.                            All questions were answered, and informed consent                            was obtained. Prior Anticoagulants: The patient has                            taken no previous anticoagulant or antiplatelet                            agents. ASA Grade Assessment: II - A patient with                            mild systemic disease. After reviewing the risks                            and benefits, the patient was deemed in                            satisfactory condition to undergo the procedure.                           After obtaining informed consent, the endoscope was  passed under direct vision. Throughout the                            procedure, the patient's blood pressure, pulse, and                            oxygen saturations were monitored continuously. The                            Endoscope was introduced through the mouth, and                            advanced to the second part of duodenum. The upper                            GI  endoscopy was accomplished without difficulty.                            The patient tolerated the procedure well. Scope In: Scope Out: Findings:                 The esophagus was somewhat dilated in the distal                            portion and revealed erosive esophagitis and slight                            stricturing at the gastroesophageal junction.                            Biopsies were taken with a cold forceps for                            histology.                           The stomach revealed a few scattered erosions but                            was otherwise normal. Biopsies were taken with a                            cold forceps for histology from the gastric antrum                            to rule out Helicobacter pylori.                           The examined duodenum was normal.                           The cardia and gastric fundus were normal on                            retroflexion, save small hiatal  hernia. Complications:            No immediate complications. Estimated Blood Loss:     Estimated blood loss: none. Impression:               1. GERD with erosive esophagitis                           2. Mild stricturing and edema at the                            gastroesophageal junction                           3. Gastric erosions.                           4. Otherwise unremarkable EGD Recommendation:           1. Prescribe pantoprazole 40 mg daily; #30; 11                            refills. This will treat your acid reflux disease                            and help heal the ulcers on your esophagus. This                            may also help with intermittent swallowing                            difficulties as you describe                           2. Follow-up biopsies                           3. Office follow-up with Dr. Henrene Pastor in 6 to 8 weeks.                           Docia Chuck Henrene Pastor, MD 11/10/2021 4:33:22 PM This report has been  signed electronically.

## 2021-11-14 ENCOUNTER — Telehealth: Payer: Self-pay

## 2021-11-14 NOTE — Telephone Encounter (Signed)
°  Follow up Call-  Call back number 11/10/2021  Post procedure Call Back phone  # [phone]  Permission to leave phone message Yes  Some recent data might be hidden     Patient questions:  Do you have a fever, pain , or abdominal swelling? No. Pain Score  0 *  Have you tolerated food without any problems? Yes.    Have you been able to return to your normal activities? Yes.    Do you have any questions about your discharge instructions: Diet   No. Medications  No. Follow up visit  No.  Do you have questions or concerns about your Care? No.  Actions: * If pain score is 4 or above: No action needed, pain <4.  Have you developed a fever since your procedure? no  2.   Have you had an respiratory symptoms (SOB or cough) since your procedure? no  3.   Have you tested positive for COVID 19 since your procedure no  4.   Have you had any family members/close contacts diagnosed with the COVID 19 since your procedure?  no   If yes to any of these questions please route to Joylene John, RN and Joella Prince, RN

## 2021-11-16 ENCOUNTER — Encounter: Payer: Self-pay | Admitting: Internal Medicine

## 2021-11-28 ENCOUNTER — Encounter: Payer: Self-pay | Admitting: Internal Medicine

## 2021-11-28 NOTE — Telephone Encounter (Signed)
Message routed to Dr. Marina Goodell for his recommendations. ?

## 2021-12-28 ENCOUNTER — Ambulatory Visit: Payer: Medicare Other | Admitting: Internal Medicine

## 2021-12-28 ENCOUNTER — Other Ambulatory Visit: Payer: Medicare Other

## 2021-12-28 ENCOUNTER — Encounter: Payer: Self-pay | Admitting: Internal Medicine

## 2021-12-28 VITALS — BP 119/72 | HR 57 | Ht 68.0 in | Wt 186.0 lb

## 2021-12-28 DIAGNOSIS — K580 Irritable bowel syndrome with diarrhea: Secondary | ICD-10-CM | POA: Diagnosis not present

## 2021-12-28 DIAGNOSIS — R140 Abdominal distension (gaseous): Secondary | ICD-10-CM | POA: Diagnosis not present

## 2021-12-28 MED ORDER — METRONIDAZOLE 250 MG PO TABS: 250.0000 mg | ORAL_TABLET | Freq: Three times a day (TID) | ORAL | 0 refills | Status: DC

## 2021-12-28 NOTE — Progress Notes (Signed)
HISTORY OF PRESENT ILLNESS: ? ?Dwayne Larsen is a 66 y.o. male with past medical history as listed below who established with this office September 27, 2021 regarding an 29-month history of problems with diarrhea and bloating.  See that dictation for details.  Prior stool studies were negative.  Blood work that day included comprehensive metabolic panel, CRP, and CBC.  No significant abnormalities.  Subsequently underwent abdominal ultrasound.  He was noted to have incidental cholelithiasis.  Fatty liver noted.  The question of focal fatty sparing raised.  MRI recommended for clarification.  MRI was performed October 31, 2021.  This confirmed hepatic steatosis.  No significant liver lesions.  The esophagus was incidentally noted to be fluid-filled.  Patient also complained of active reflux symptoms at the time of his evaluation.  He was set up for colonoscopy and upper endoscopy which were performed November 10, 2021.  Upper endoscopy revealed erosive esophagitis with mild stricturing.  Biopsies revealed esophageal eosinophilia.  Biopsies of the gastric antrum more unremarkable.  He was placed on pantoprazole 40 mg daily.  Colonoscopy revealed left-sided diverticulosis.  The ileum was normal.  The entire colon was otherwise normal.  Random colon biopsies were obtained and returned normal.  No evidence for microscopic colitis.  He was advised to take Citrucel daily, antidiarrheals as needed, and follow-up at this time.  Patient tells me that his issues with reflux and dysphagia have resolved.  He is pleased.  However, he continues with the same lower GI complaints.  He takes Imodium on an as-needed basis.  He did not feel that fiber was helpful.  He did try IBgard for a period of time.  This did not help.  He does not notice that his symptoms are necessarily affected by meals.  No issues after falling asleep at night though he may have symptoms just prior to bedtime.  He wonders about possible etiologies.  Since all of  this began, he lost 10 pounds. ? ?REVIEW OF SYSTEMS: ? ?All non-GI ROS negative unless otherwise stated in the HPI. ? ?Past Medical History:  ?Diagnosis Date  ? Abdominal pain, left lower quadrant   ? Allergy   ? Allergy, unspecified not elsewhere classified   ? BPH (benign prostatic hyperplasia)   ? CAD (coronary artery disease) 03/07/2014  ? 3.0 x 18 mm Xience drug-eluting stent.  The stent was postdilated with a 3.25 noncompliant balloon.  ? DJD (degenerative joint disease)   ? Elevated prostate specific antigen (PSA)   ? Gout   ? History of kidney stones   ? Hypercholesteremia   ? Lumbar back pain   ? Palpitations   ? PONV (postoperative nausea and vomiting)   ? nausea  ? ? ?Past Surgical History:  ?Procedure Laterality Date  ? COLONOSCOPY    ? CYSTOSCOPY WITH LITHOLAPAXY N/A 01/13/2020  ? Procedure: CYSTOSCOPY WITH LITHOLAPAXY;  Surgeon: Malen Gauze, MD;  Location: [facility];  Service: Urology;  Laterality: N/A;  ? KNEE ARTHROSCOPY    ? right  ? LAMINECTOMY AND MICRODISCECTOMY LUMBAR SPINE  2003  ? L4-5  ? LEFT HEART CATHETERIZATION WITH CORONARY ANGIOGRAM N/A 03/07/2014  ? Procedure: LEFT HEART CATHETERIZATION WITH CORONARY ANGIOGRAM;  Surgeon: Iran Ouch, MD;  Location: [facility] CATH LAB;  Service: Cardiovascular;  Laterality: N/A;  ? PERCUTANEOUS CORONARY STENT INTERVENTION (PCI-S)  03/07/2014  ? Procedure: PERCUTANEOUS CORONARY STENT INTERVENTION (PCI-S);  Surgeon: Iran Ouch, MD;  Location: [facility] CATH LAB;  Service: Cardiovascular;;  ? RECTAL  SURGERY  2003  ? Dr. Orson Slick  ? TRANSURETHRAL RESECTION OF PROSTATE N/A 01/13/2020  ? Procedure: TRANSURETHRAL RESECTION OF THE PROSTATE (TURP);  Surgeon: Malen Gauze, MD;  Location: [facility];  Service: Urology;  Laterality: N/A;  ? ? ?Social History ?Janifer Adie  reports that he quit smoking about 41 years ago. His smoking use included cigarettes. He has a 10.00 pack-year smoking history. He has never used  smokeless tobacco. He reports current alcohol use. He reports that he does not use drugs. ? ?family history includes Coronary artery disease in his father; Deep vein thrombosis in his father; Diabetes in his mother; Diverticulitis in his mother; Hyperlipidemia in his father; Prostate cancer in his father. ? ?Allergies  ?Allergen Reactions  ? Other Itching and Rash  ?  Walnuts and pecans  ? Penicillins Other (See Comments)  ?  Did it involve swelling of the face/tongue/throat, SOB, or low BP?No ?Did it involve sudden or severe rash/hives, skin peeling, or any reaction on the inside of your mouth or nose? yes ?Did you need to seek medical attention at a hospital or doctor's office? no ?When did it last happen? 66yrs old      ?If all above answers are ?NO?, may proceed with cephalosporin use. ?  ? ? ?  ? ?PHYSICAL EXAMINATION: ?Vital signs: BP 119/72   Pulse (!) 57   Ht 5\' 8"  (1.727 m)   Wt 186 lb (84.4 kg)   SpO2 97%   BMI 28.28 kg/m?   ?Constitutional: generally well-appearing, no acute distress ?Psychiatric: alert and oriented x3, cooperative ?Eyes: extraocular movements intact, anicteric, conjunctiva pink ?Mouth: oral pharynx moist, no lesions ?Neck: supple no lymphadenopathy ?Cardiovascular: heart regular rate and rhythm, no murmur ?Lungs: clear to auscultation bilaterally ?Abdomen: soft, nontender, nondistended, no obvious ascites, no peritoneal signs, normal bowel sounds, no organomegaly ?Rectal: Normal at recent colonoscopy.  Not repeated ?Extremities: no clubbing, cyanosis, or lower extremity edema bilaterally ?Skin: no lesions on visible extremities ?Neuro: No focal deficits.  Cranial nerves intact ? ?ASSESSMENT: ? ?1.  Problems with intermittent diarrhea and bloating.  This after sounds like an acute infectious process.  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  We discussed this.  Rule out other causes such as gluten sensitive enteropathy or bacterial overgrowth ?2.  GERD complicated by esophagitis  and peptic stricture.  Currently asymptomatic on PPI ? ? ?PLAN: ? ?1.  Check serology screening for celiac disease ?2.  Prescribe metronidazole 250 mg 3 times daily x10 days.  We reviewed medication risks/side effects.  He does not use alcohol. ?3.  Try Imodium on a more regular basis.  For example start out once daily.  This is opposed to on-demand use.  Hold for constipation ?4.  Reflux precautions ?5.  Continue PPI ?6.  GI office follow-up 3 months.  Could consider trial of Xifaxan depending upon outcomes from the above. ?A total time of 30 minutes was spent preparing to see the patient, reviewing test and pathology, obtaining comprehensive history, performing medically appropriate physical exam, counseling the patient regarding the above listed issues, ordering serologies and medication.  Finally, documenting clinical information in the health record ? ? ? ? ?  ?

## 2021-12-28 NOTE — Patient Instructions (Signed)
If you are age 66 or older, your body mass index should be between 23-30. Your Body mass index is 28.28 kg/m?Marland Kitchen If this is out of the aforementioned range listed, please consider follow up with your Primary Care Provider. ? ?If you are age 92 or younger, your body mass index should be between 19-25. Your Body mass index is 28.28 kg/m?Marland Kitchen If this is out of the aformentioned range listed, please consider follow up with your Primary Care Provider.  ? ?________________________________________________________ ? ?The [place] [facility] providers would like to encourage you to use [facility] to communicate with providers for non-urgent requests or questions.  Due to long hold times on the telephone, sending your provider a message by [facility] may be a faster and more efficient way to get a response.  Please allow 48 business hours for a response.  Please remember that this is for non-urgent requests.  ?_______________________________________________________ ? ?Your provider has requested that you go to the basement level for lab work before leaving today. Press "B" on the elevator. The lab is located at the first door on the left as you exit the elevator. ? ?We have sent the following medications to your pharmacy for you to pick up at your convenience:  Flagyl ? ?Please follow up in 3 months ? ?

## 2021-12-29 LAB — IGA: Immunoglobulin A: 204 mg/dL (ref 70–320)

## 2021-12-29 LAB — TISSUE TRANSGLUTAMINASE, IGA: (tTG) Ab, IgA: <1 U/mL

## 2022-01-04 DIAGNOSIS — L570 Actinic keratosis: Secondary | ICD-10-CM | POA: Diagnosis not present

## 2022-01-04 DIAGNOSIS — L918 Other hypertrophic disorders of the skin: Secondary | ICD-10-CM | POA: Diagnosis not present

## 2022-01-04 DIAGNOSIS — D1801 Hemangioma of skin and subcutaneous tissue: Secondary | ICD-10-CM | POA: Diagnosis not present

## 2022-01-04 DIAGNOSIS — L821 Other seborrheic keratosis: Secondary | ICD-10-CM | POA: Diagnosis not present

## 2022-01-04 DIAGNOSIS — D225 Melanocytic nevi of trunk: Secondary | ICD-10-CM | POA: Diagnosis not present

## 2022-02-01 ENCOUNTER — Encounter: Payer: Self-pay | Admitting: Internal Medicine

## 2022-02-01 ENCOUNTER — Other Ambulatory Visit: Payer: Self-pay

## 2022-02-01 MED ORDER — METRONIDAZOLE 250 MG PO TABS: 250.0000 mg | ORAL_TABLET | Freq: Three times a day (TID) | ORAL | 0 refills | Status: DC

## 2022-02-05 ENCOUNTER — Other Ambulatory Visit: Payer: Self-pay | Admitting: Cardiology

## 2022-02-05 DIAGNOSIS — I480 Paroxysmal atrial fibrillation: Secondary | ICD-10-CM

## 2022-02-27 DIAGNOSIS — I10 Essential (primary) hypertension: Secondary | ICD-10-CM | POA: Diagnosis not present

## 2022-02-27 DIAGNOSIS — N1832 Chronic kidney disease, stage 3b: Secondary | ICD-10-CM | POA: Diagnosis not present

## 2022-02-27 DIAGNOSIS — E785 Hyperlipidemia, unspecified: Secondary | ICD-10-CM | POA: Diagnosis not present

## 2022-02-27 DIAGNOSIS — I129 Hypertensive chronic kidney disease with stage 1 through stage 4 chronic kidney disease, or unspecified chronic kidney disease: Secondary | ICD-10-CM | POA: Diagnosis not present

## 2022-02-28 DIAGNOSIS — I10 Essential (primary) hypertension: Secondary | ICD-10-CM | POA: Insufficient documentation

## 2022-02-28 NOTE — Progress Notes (Signed)
Follow up visit  Subjective:   Dwayne Larsen, male    DOB: [birth year], 66 y.o.   MRN: [id number]   HPI   Chief Complaint  Patient presents with   Hypertension   PAF   Follow-up    66 y.o. Caucasian male with controlled hypertension, hyperlipidemia, CAD s/p STEMI and RCA PCI 2015, paroxysmal atrial fibrillation.  Patient is doing well, denies chest pain, shortness of breath, leg edema, orthopnea, PND, TIA/syncope. Blood pressure is well controlled. Palpitations symptoms are occasional, last <24 hrs.  Previous office note 01/2020: At patient's last visit with me, I recommended event monitor to evaluate for his episodes of palpitations.  Event monitor showed at least 7 episodes of atrial fibrillation with RVR with or without symptoms.  Fastest heart rate with A. fib was surprisingly fast at 208 bpm.  He had 3-second pauses postconversion from atrial fibrillation.  Given that he is minimally symptomatic, he was reluctant to try rhythm control therapy.  We discussed rate control therapy with increasing dose of metoprolol tartrate to 25 mg twice daily.  ACE inhibitor would be also a reasonable choice given his hypertension and its antifibrotic properties.  However, he would like to avoid starting any medication at this time.  Finally, we discussed risks benefits of anticoagulation given his CHA2DS2VASc score of 2, and annual stroke risk of 2.2%.  I explained to the patient that presence or absence of symptoms is unrelated to his risk of stroke based on his risk factors.  Knowing the risks and benefits, he preferred to avoid anticoagulation and preferred to continue aspirin 81 mg daily for his coronary disease.  I also gave him the option of left atrial appendage closure, but he was not interested at that time.  Patient is here today for follow-up visit.  He has had 2 episodes of palpitations in the last 6 months.  Otherwise, he is doing well without any symptoms of chest pain, shortness of  breath.  He again expressed his preference to avoid anticoagulation at this time.   Current Outpatient Medications:    aspirin 81 MG tablet, Take 81 mg by mouth daily.  , Disp: , Rfl:    atorvastatin (LIPITOR) 80 MG tablet, TAKE 1 TABLET BY MOUTH DAILY AT 6 PM, Disp: 30 tablet, Rfl: 0   fenofibrate 160 MG tablet, TAKE 1 TABLET(160 MG) BY MOUTH DAILY, Disp: 90 tablet, Rfl: 2   metoprolol tartrate (LOPRESSOR) 25 MG tablet, TAKE 1 TABLET BY MOUTH TWICE DAILY, Disp: 180 tablet, Rfl: 2   metroNIDAZOLE (FLAGYL) 250 MG tablet, Take 1 tablet (250 mg total) by mouth 3 (three) times daily., Disp: 30 tablet, Rfl: 0   Multiple Vitamin (MULTIVITAMIN WITH MINERALS) TABS tablet, Take 1 tablet by mouth daily., Disp: , Rfl:    nitroGLYCERIN (NITROSTAT) 0.4 MG SL tablet, Place 1 tablet (0.4 mg total) under the tongue every 5 (five) minutes as needed for chest pain., Disp: 25 tablet, Rfl: 3   pantoprazole (PROTONIX) 40 MG tablet, Take 1 tablet (40 mg total) by mouth daily., Disp: 90 tablet, Rfl: 3   zolpidem (AMBIEN) 10 MG tablet, Take 10 mg by mouth at bedtime as needed for sleep., Disp: , Rfl:    Cardiovascular & other pertient studies:  EKG 03/01/2022: Sinus rhythm 64 bpm  Left atrial enlargement Nonspecific T-abnormality  Event monitor 08/26/2019 - 09/24/2019:  Diagnostic time: 99%  Dominant rhythm: Sinus.  HR 46-208 bpm. Avg HR 70 bpm.  7 episodes of Afib w/RVR, associated with  and without symptoms of flutter or skipped beats.  Afib burden: 2% event monitor time.  One post Afib conversion pause of 3 sec.  Occasional PVC's.  No atrial atrial flutter/SVT/VT/high grade AV block, sinus pause >3sec noted.     Echocardiogram 2015: - Left ventricle: Average global LV strain is -17.3% The cavity    size was normal. There was mild focal basal hypertrophy of the    septum. Systolic function was mildly reduced. The estimated    ejection fraction was 50%. There is akinesis of the    basal-midinferior  myocardium. There is akinesis of the    basalinferoseptal myocardium.  - Mitral valve: There was mild regurgitation.    Coronary angiography and intervention 2015 (Dr. Fletcher Anon): LM: Normal LAD: Prox diffuse 20% disease LCx: Mid 50% stenosis. Mild calcification RCA: Large, dominant. Mid 50% stenosis with distal occlusion with thrombus. Prox-mid PDA 60% stenosis.      Aspiration thrombectomy and PCI dRCA 3.0 X 18 mm Xience DES     Recent labs: 1-12/2021: Glucose 178, BUN/Cr 22/1.55. EGFR 46. Na/K 135/4.6. Rest of the CMP normal H/H 15/46. MCV 81. Platelets 189  01/29/2020: Glucose 170, BUN/Cr 23/2.1. EGFR 31. Na/K 137/3.9. Rest of the CMP normal H/H 12/38. MCV 83. Platelets 206  Review of Systems  Cardiovascular:  Positive for palpitations. Negative for chest pain, dyspnea on exertion, leg swelling and syncope.         Vitals:   03/01/22 1409  BP: 127/70  Resp: 16  Temp: 98.1 F (36.7 C)    Body mass index is 29.19 kg/m. Filed Weights   03/01/22 1409  Weight: 192 lb (87.1 kg)     Objective:   Physical Exam Vitals and nursing note reviewed.  Constitutional:      General: He is not in acute distress. Neck:     Vascular: No JVD.  Cardiovascular:     Rate and Rhythm: Normal rate and regular rhythm.     Pulses: Intact distal pulses.     Heart sounds: Normal heart sounds. No murmur heard. Pulmonary:     Effort: Pulmonary effort is normal.     Breath sounds: Normal breath sounds. No wheezing or rales.  Musculoskeletal:     Right lower leg: No edema.     Left lower leg: No edema.          Assessment & Recommendations:   67 y.o. Caucasian male with controlled hypertension, hyperlipidemia, CAD s/p STEMI and RCA PCI 2015, paroxysmal atrial fibrillation.   Paroxysmal atrial fibrillation: Rare episodes with symptoms of palpitations. Given that he is minimally symptomatic, continued rate control therapy with metoprolol tartrate 25 mg twice daily is reasonable.     CHA2DS2VASc score of 2, and annual stroke risk of 2.2%.   Previously, I explained to the patient that presence or absence of symptoms is unrelated to his risk of stroke based on his risk factors.  Knowing the risks and benefits, he has chosen to avoid anticoagulation at this time and would like to continue aspirin 81 mg daily for his coronary disease. In terms of etiology evaluation for atrial fibrillation, he does not have any ischemic symptoms of the time.  He would also like to hold off sleep study evaluation at this time.  Hypertension: Well controlled.   Coronary artery disease: No angina symptoms.  Continue aspirin, statin.    F/u in 1 year  Nigel Mormon, MD [facility] Pager: [phone] Office: [phone]

## 2022-03-01 ENCOUNTER — Encounter: Payer: Self-pay | Admitting: Cardiology

## 2022-03-01 ENCOUNTER — Ambulatory Visit: Payer: Medicare Other | Admitting: Cardiology

## 2022-03-01 VITALS — BP 127/70 | HR 82 | Temp 98.1°F | Resp 16 | Ht 68.0 in | Wt 192.0 lb

## 2022-03-01 DIAGNOSIS — I10 Essential (primary) hypertension: Secondary | ICD-10-CM | POA: Diagnosis not present

## 2022-03-01 DIAGNOSIS — I2510 Atherosclerotic heart disease of native coronary artery without angina pectoris: Secondary | ICD-10-CM | POA: Diagnosis not present

## 2022-03-01 DIAGNOSIS — I480 Paroxysmal atrial fibrillation: Secondary | ICD-10-CM | POA: Diagnosis not present

## 2022-03-03 ENCOUNTER — Other Ambulatory Visit: Payer: Self-pay

## 2022-03-03 ENCOUNTER — Encounter: Payer: Self-pay | Admitting: Cardiology

## 2022-03-03 MED ORDER — METRONIDAZOLE 250 MG PO TABS: 250.0000 mg | ORAL_TABLET | Freq: Three times a day (TID) | ORAL | 0 refills | Status: DC

## 2022-05-06 ENCOUNTER — Other Ambulatory Visit: Payer: Self-pay | Admitting: Cardiology

## 2022-06-10 ENCOUNTER — Other Ambulatory Visit: Payer: Self-pay | Admitting: Internal Medicine

## 2022-06-27 ENCOUNTER — Other Ambulatory Visit: Payer: Self-pay | Admitting: Internal Medicine

## 2022-08-11 DIAGNOSIS — N1832 Chronic kidney disease, stage 3b: Secondary | ICD-10-CM | POA: Diagnosis not present

## 2022-08-11 DIAGNOSIS — E785 Hyperlipidemia, unspecified: Secondary | ICD-10-CM | POA: Diagnosis not present

## 2022-08-11 DIAGNOSIS — M109 Gout, unspecified: Secondary | ICD-10-CM | POA: Diagnosis not present

## 2022-08-11 DIAGNOSIS — R739 Hyperglycemia, unspecified: Secondary | ICD-10-CM | POA: Diagnosis not present

## 2022-08-21 DIAGNOSIS — E785 Hyperlipidemia, unspecified: Secondary | ICD-10-CM | POA: Diagnosis not present

## 2022-08-21 DIAGNOSIS — Z0000 Encounter for general adult medical examination without abnormal findings: Secondary | ICD-10-CM | POA: Diagnosis not present

## 2022-08-21 DIAGNOSIS — I129 Hypertensive chronic kidney disease with stage 1 through stage 4 chronic kidney disease, or unspecified chronic kidney disease: Secondary | ICD-10-CM | POA: Diagnosis not present

## 2022-08-21 DIAGNOSIS — N1832 Chronic kidney disease, stage 3b: Secondary | ICD-10-CM | POA: Diagnosis not present

## 2022-09-28 ENCOUNTER — Encounter: Payer: Self-pay | Admitting: Internal Medicine

## 2022-09-28 ENCOUNTER — Other Ambulatory Visit: Payer: Self-pay

## 2022-09-28 MED ORDER — METRONIDAZOLE 250 MG PO TABS: 250.0000 mg | ORAL_TABLET | Freq: Three times a day (TID) | ORAL | 1 refills | Status: DC

## 2022-09-28 NOTE — Telephone Encounter (Signed)
Thank you. Lets treat Dwayne Larsen with metronidazole 250 mg p.o. 3 times daily x 10 days; #30; 1 refill. Please send him my regards.  Thanks Dr. Henrene Pastor

## 2023-01-03 ENCOUNTER — Other Ambulatory Visit: Payer: Self-pay | Admitting: Internal Medicine

## 2023-01-11 DIAGNOSIS — D225 Melanocytic nevi of trunk: Secondary | ICD-10-CM | POA: Diagnosis not present

## 2023-01-11 DIAGNOSIS — L570 Actinic keratosis: Secondary | ICD-10-CM | POA: Diagnosis not present

## 2023-01-11 DIAGNOSIS — L821 Other seborrheic keratosis: Secondary | ICD-10-CM | POA: Diagnosis not present

## 2023-01-11 DIAGNOSIS — L812 Freckles: Secondary | ICD-10-CM | POA: Diagnosis not present

## 2023-01-11 DIAGNOSIS — L918 Other hypertrophic disorders of the skin: Secondary | ICD-10-CM | POA: Diagnosis not present

## 2023-01-31 ENCOUNTER — Other Ambulatory Visit: Payer: Self-pay | Admitting: Cardiology

## 2023-02-07 DIAGNOSIS — K080 Exfoliation of teeth due to systemic causes: Secondary | ICD-10-CM | POA: Diagnosis not present

## 2023-02-08 ENCOUNTER — Other Ambulatory Visit: Payer: Self-pay | Admitting: Internal Medicine

## 2023-02-09 ENCOUNTER — Other Ambulatory Visit: Payer: Self-pay | Admitting: Internal Medicine

## 2023-02-23 DIAGNOSIS — Z1331 Encounter for screening for depression: Secondary | ICD-10-CM | POA: Diagnosis not present

## 2023-02-23 DIAGNOSIS — Z1339 Encounter for screening examination for other mental health and behavioral disorders: Secondary | ICD-10-CM | POA: Diagnosis not present

## 2023-02-23 DIAGNOSIS — I129 Hypertensive chronic kidney disease with stage 1 through stage 4 chronic kidney disease, or unspecified chronic kidney disease: Secondary | ICD-10-CM | POA: Diagnosis not present

## 2023-02-23 DIAGNOSIS — E785 Hyperlipidemia, unspecified: Secondary | ICD-10-CM | POA: Diagnosis not present

## 2023-03-02 ENCOUNTER — Encounter: Payer: Self-pay | Admitting: Cardiology

## 2023-03-02 ENCOUNTER — Ambulatory Visit: Payer: Medicare Other | Admitting: Cardiology

## 2023-03-02 VITALS — BP 136/84 | HR 66 | Resp 16 | Ht 68.0 in | Wt 194.4 lb

## 2023-03-02 DIAGNOSIS — I480 Paroxysmal atrial fibrillation: Secondary | ICD-10-CM | POA: Diagnosis not present

## 2023-03-02 DIAGNOSIS — I2510 Atherosclerotic heart disease of native coronary artery without angina pectoris: Secondary | ICD-10-CM | POA: Diagnosis not present

## 2023-03-02 DIAGNOSIS — E782 Mixed hyperlipidemia: Secondary | ICD-10-CM

## 2023-03-02 MED ORDER — EVOLOCUMAB 140 MG/ML ~~LOC~~ SOAJ
140.0000 mg | Freq: Once | SUBCUTANEOUS | Status: AC
Start: 2023-03-02 — End: 2023-03-02
  Administered 2023-03-02: 140 mg via SUBCUTANEOUS

## 2023-03-02 MED ORDER — REPATHA SURECLICK 140 MG/ML ~~LOC~~ SOAJ
140.0000 mg | SUBCUTANEOUS | 5 refills | Status: DC
Start: 2023-03-02 — End: 2023-03-13

## 2023-03-02 MED ORDER — APIXABAN 5 MG PO TABS: 5.0000 mg | ORAL_TABLET | Freq: Two times a day (BID) | ORAL | 3 refills | Status: DC

## 2023-03-02 NOTE — Progress Notes (Signed)
Follow up visit  Subjective:   Dwayne Larsen, male    DOB: 01/18/1956, 67 y.o.   MRN: [id number]   HPI   Chief Complaint  Patient presents with   Atrial Fibrillation   Follow-up    1 year    67 y.o. Caucasian male with controlled hypertension, hyperlipidemia, CAD s/p STEMI and RCA PCI 2015, paroxysmal atrial fibrillation.  Patient is doing well, denies chest pain, shortness of breath, leg edema, orthopnea, PND, TIA/syncope.  He has roughly 1-2 episodes of palpitations in a month.  He exercises 3 times a week without any difficulty.  Reviewed recent test results with the patient, details below.    Previous office note 01/2020: At patient's last visit with me, I recommended event monitor to evaluate for his episodes of palpitations.  Event monitor showed at least 7 episodes of atrial fibrillation with RVR with or without symptoms.  Fastest heart rate with A. fib was surprisingly fast at 208 bpm.  He had 3-second pauses postconversion from atrial fibrillation.  Given that he is minimally symptomatic, he was reluctant to try rhythm control therapy.  We discussed rate control therapy with increasing dose of metoprolol tartrate to 25 mg twice daily.  ACE inhibitor would be also a reasonable choice given his hypertension and its antifibrotic properties.  However, he would like to avoid starting any medication at this time.  Finally, we discussed risks benefits of anticoagulation given his CHA2DS2VASc score of 2, and annual stroke risk of 2.2%.  I explained to the patient that presence or absence of symptoms is unrelated to his risk of stroke based on his risk factors.  Knowing the risks and benefits, he preferred to avoid anticoagulation and preferred to continue aspirin 81 mg daily for his coronary disease.  I also gave him the option of left atrial appendage closure, but he was not interested at that time.  Patient is here today for follow-up visit.  He has had 2 episodes of palpitations in  the last 6 months.  Otherwise, he is doing well without any symptoms of chest pain, shortness of breath.  He again expressed his preference to avoid anticoagulation at this time.   Current Outpatient Medications:    aspirin 81 MG tablet, Take 81 mg by mouth daily.  , Disp: , Rfl:    atorvastatin (LIPITOR) 80 MG tablet, TAKE 1 TABLET BY MOUTH DAILY AT 6 PM, Disp: 30 tablet, Rfl: 0   fenofibrate 160 MG tablet, TAKE 1 TABLET(160 MG) BY MOUTH DAILY, Disp: 90 tablet, Rfl: 3   metoprolol tartrate (LOPRESSOR) 25 MG tablet, TAKE 1 TABLET BY MOUTH TWICE DAILY (Patient taking differently: Take 0.5 tablets by mouth 2 (two) times daily.), Disp: 180 tablet, Rfl: 2   Multiple Vitamin (MULTIVITAMIN WITH MINERALS) TABS tablet, Take 1 tablet by mouth daily., Disp: , Rfl:    nitroGLYCERIN (NITROSTAT) 0.4 MG SL tablet, Place 1 tablet (0.4 mg total) under the tongue every 5 (five) minutes as needed for chest pain., Disp: 25 tablet, Rfl: 3   pantoprazole (PROTONIX) 40 MG tablet, Take 1 tablet (40 mg total) by mouth daily. Office visit for further refills, Disp: 30 tablet, Rfl: 2   zolpidem (AMBIEN) 10 MG tablet, Take 10 mg by mouth at bedtime as needed for sleep. (Patient not taking: Reported on 03/02/2023), Disp: , Rfl:    Cardiovascular & other pertient studies:  EKG 03/02/2023: Sinus rhythm 61 bpm  Left atrial enlargement Probable old inferior infarct Nonspecific T-abnormality  Event monitor  08/26/2019 - 09/24/2019:  Diagnostic time: 99%  Dominant rhythm: Sinus.  HR 46-208 bpm. Avg HR 70 bpm.  7 episodes of Afib w/RVR, associated with and without symptoms of flutter or skipped beats.  Afib burden: 2% event monitor time.  One post Afib conversion pause of 3 sec.  Occasional PVC's.  No atrial atrial flutter/SVT/VT/high grade AV block, sinus pause >3sec noted.    Echocardiogram 2015: - Left ventricle: Average global LV strain is -17.3% The cavity    size was normal. There was mild focal basal hypertrophy of  the    septum. Systolic function was mildly reduced. The estimated    ejection fraction was 50%. There is akinesis of the    basal-midinferior myocardium. There is akinesis of the    basalinferoseptal myocardium.  - Mitral valve: There was mild regurgitation.    Coronary angiography and intervention 2015 (Dr. Kirke Corin): LM: Normal LAD: Prox diffuse 20% disease LCx: Mid 50% stenosis. Mild calcification RCA: Large, dominant. Mid 50% stenosis with distal occlusion with thrombus. Prox-mid PDA 60% stenosis.      Aspiration thrombectomy and PCI dRCA 3.0 X 18 mm Xience DES     Recent labs: 08/11/2022: Glucose NA, BUN/Cr 22/1.5. EGFR 43. Na/K K 4.2 Hb 15.4 HbA1C 6.6% Chol 139, TG 144, HDL 32, LDL 78 TSH 1.5 normal  1-12/2021: Glucose 178, BUN/Cr 22/1.55. EGFR 46. Na/K 135/4.6. Rest of the CMP normal H/H 15/46. MCV 81. Platelets 189  01/29/2020: Glucose 170, BUN/Cr 23/2.1. EGFR 31. Na/K 137/3.9. Rest of the CMP normal H/H 12/38. MCV 83. Platelets 206  Review of Systems  Cardiovascular:  Positive for palpitations. Negative for chest pain, dyspnea on exertion, leg swelling and syncope.         Vitals:   03/02/23 1358  BP: 136/84  Pulse: 66  Resp: 16  SpO2: 96%    Body mass index is 29.56 kg/m. Filed Weights   03/02/23 1358  Weight: 194 lb 6.4 oz (88.2 kg)     Objective:   Physical Exam Vitals and nursing note reviewed.  Constitutional:      General: He is not in acute distress. Neck:     Vascular: No JVD.  Cardiovascular:     Rate and Rhythm: Normal rate and regular rhythm.     Pulses: Intact distal pulses.     Heart sounds: Normal heart sounds. No murmur heard. Pulmonary:     Effort: Pulmonary effort is normal.     Breath sounds: Normal breath sounds. No wheezing or rales.  Musculoskeletal:     Right lower leg: No edema.     Left lower leg: No edema.          Assessment & Recommendations:   67 y.o. Caucasian male with controlled hypertension,  hyperlipidemia, CAD s/p STEMI and RCA PCI 2015, paroxysmal atrial fibrillation.  Paroxysmal atrial fibrillation: Rare episodes with symptoms of palpitations. Given that he is minimally symptomatic, continued rate control therapy with metoprolol tartrate 25 mg twice daily is reasonable.   Will check echocardiogram. CHA2DS2VASc score of 3, and annual stroke risk of 3.6 %.   We again discussed need for anticoagulation.  Patient is agreeable to starting Eliquis 5 mg twice daily.  Given remote PCI history, we decided to discontinue aspirin to reduce bleeding risk.  CAD: No angina symptoms. History of MI and RCA PCI in 2015. LDL remains >70 on Lipitor 80 mg daily, is also on fenofibrate with improvement in triglyceride. For further risk reduction, recommend Repatha every 2  weeks. Will repeat lipid panel in 3 months.  If substantial reduction in LDL, could consider reducing Lipitor dose to 20 mg daily. Repatha sample injection administered today. Evolocumab SOAJ 140 mgDose: 140 mg  :  Subcutaneous  :   Once Order ID: [id number]  Ordered Admin Dose: 140 mg  Last Admin: Today 03/02/23 at 1524 (Given)    Given : 03/02/23 : 140 mg : Right Anterior Thigh    Hypertension: Well controlled.    F/u in 1 year  Elder Negus, MD [facility] Pager: [phone] Office: [phone]

## 2023-03-06 ENCOUNTER — Encounter: Payer: Self-pay | Admitting: Cardiology

## 2023-03-06 NOTE — Telephone Encounter (Signed)
From pt

## 2023-03-06 NOTE — Telephone Encounter (Signed)
Eliquis and aspirin worked differently.  I will aspirin is good from coronary artery disease standpoint, it does not provide adequate protection against stroke risk causing from paroxysmal A-fib.  Therefore, anticoagulation with Eliquis or similar agent would be ideal.  If cost is prohibitive, we could look at relatively cheaper, but rather cumbersome option of warfarin.  With this, it is necessary to check INR (level of health in the blood is) every 4 weeks or so. We can certainly look into patient assistance, to see if that helps.  Thanks MJP

## 2023-03-13 ENCOUNTER — Other Ambulatory Visit: Payer: Self-pay

## 2023-03-13 DIAGNOSIS — E782 Mixed hyperlipidemia: Secondary | ICD-10-CM

## 2023-03-13 DIAGNOSIS — I2510 Atherosclerotic heart disease of native coronary artery without angina pectoris: Secondary | ICD-10-CM

## 2023-03-13 MED ORDER — REPATHA SURECLICK 140 MG/ML ~~LOC~~ SOAJ
140.0000 mg | SUBCUTANEOUS | 5 refills | Status: DC
Start: 2023-03-13 — End: 2024-04-28

## 2023-03-29 ENCOUNTER — Other Ambulatory Visit: Payer: Medicare Other

## 2023-04-17 NOTE — Telephone Encounter (Signed)
Dwayne Larsen, pls contact patient and send in RX for Pantoprazole 40mg  one capsule po to be taken 30 minutes before breakfast # 30, 1 refill. Not sure that Pantoprazole will make improve his diarrhea but may reduce his abdominal bloat. Patient to contact office if diarrhea worsens on Pantoprazole. Schedule him for a follow up visit if his symptoms persist or worsen. THX.

## 2023-04-18 ENCOUNTER — Other Ambulatory Visit: Payer: Self-pay

## 2023-04-18 ENCOUNTER — Other Ambulatory Visit: Payer: Self-pay | Admitting: Nurse Practitioner

## 2023-04-18 DIAGNOSIS — R197 Diarrhea, unspecified: Secondary | ICD-10-CM

## 2023-04-18 DIAGNOSIS — R140 Abdominal distension (gaseous): Secondary | ICD-10-CM

## 2023-04-18 MED ORDER — PANTOPRAZOLE SODIUM 40 MG PO TBEC
40.0000 mg | DELAYED_RELEASE_TABLET | Freq: Every day | ORAL | 1 refills | Status: DC
Start: 2023-04-18 — End: 2023-04-18

## 2023-04-18 MED ORDER — PANTOPRAZOLE SODIUM 40 MG PO TBEC
40.0000 mg | DELAYED_RELEASE_TABLET | Freq: Every day | ORAL | 1 refills | Status: DC
Start: 2023-04-18 — End: 2024-02-21

## 2023-04-19 ENCOUNTER — Ambulatory Visit: Payer: Medicare Other

## 2023-04-19 DIAGNOSIS — I2510 Atherosclerotic heart disease of native coronary artery without angina pectoris: Secondary | ICD-10-CM | POA: Diagnosis not present

## 2023-04-20 DIAGNOSIS — K080 Exfoliation of teeth due to systemic causes: Secondary | ICD-10-CM | POA: Diagnosis not present

## 2023-04-26 ENCOUNTER — Other Ambulatory Visit: Payer: Self-pay

## 2023-04-26 MED ORDER — METRONIDAZOLE 250 MG PO TABS: 250.0000 mg | ORAL_TABLET | Freq: Three times a day (TID) | ORAL | 2 refills | Status: AC

## 2023-05-09 ENCOUNTER — Other Ambulatory Visit: Payer: Self-pay | Admitting: Cardiology

## 2023-05-09 DIAGNOSIS — I480 Paroxysmal atrial fibrillation: Secondary | ICD-10-CM

## 2023-06-05 ENCOUNTER — Other Ambulatory Visit: Payer: Self-pay | Admitting: Internal Medicine

## 2023-07-06 DIAGNOSIS — N1339 Other hydronephrosis: Secondary | ICD-10-CM | POA: Diagnosis not present

## 2023-07-06 DIAGNOSIS — N401 Enlarged prostate with lower urinary tract symptoms: Secondary | ICD-10-CM | POA: Diagnosis not present

## 2023-07-06 DIAGNOSIS — N130 Hydronephrosis with ureteropelvic junction obstruction: Secondary | ICD-10-CM | POA: Diagnosis not present

## 2023-07-26 DIAGNOSIS — N200 Calculus of kidney: Secondary | ICD-10-CM | POA: Diagnosis not present

## 2023-07-26 DIAGNOSIS — N281 Cyst of kidney, acquired: Secondary | ICD-10-CM | POA: Diagnosis not present

## 2023-07-30 DIAGNOSIS — R3912 Poor urinary stream: Secondary | ICD-10-CM | POA: Diagnosis not present

## 2023-07-30 DIAGNOSIS — N401 Enlarged prostate with lower urinary tract symptoms: Secondary | ICD-10-CM | POA: Diagnosis not present

## 2023-08-20 DIAGNOSIS — E785 Hyperlipidemia, unspecified: Secondary | ICD-10-CM | POA: Diagnosis not present

## 2023-08-20 DIAGNOSIS — N401 Enlarged prostate with lower urinary tract symptoms: Secondary | ICD-10-CM | POA: Diagnosis not present

## 2023-08-20 DIAGNOSIS — M109 Gout, unspecified: Secondary | ICD-10-CM | POA: Diagnosis not present

## 2023-08-20 DIAGNOSIS — E1122 Type 2 diabetes mellitus with diabetic chronic kidney disease: Secondary | ICD-10-CM | POA: Diagnosis not present

## 2023-08-20 DIAGNOSIS — Z1212 Encounter for screening for malignant neoplasm of rectum: Secondary | ICD-10-CM | POA: Diagnosis not present

## 2023-08-27 DIAGNOSIS — E1122 Type 2 diabetes mellitus with diabetic chronic kidney disease: Secondary | ICD-10-CM | POA: Diagnosis not present

## 2023-08-27 DIAGNOSIS — R82998 Other abnormal findings in urine: Secondary | ICD-10-CM | POA: Diagnosis not present

## 2023-08-27 DIAGNOSIS — Z0000 Encounter for general adult medical examination without abnormal findings: Secondary | ICD-10-CM | POA: Diagnosis not present

## 2023-10-01 ENCOUNTER — Telehealth: Payer: Self-pay | Admitting: Internal Medicine

## 2023-10-01 NOTE — Telephone Encounter (Signed)
 Patient called stating he has bloating and diarrhea, would like to know if metronidazole can be prescribed since it worked well last time.

## 2023-10-02 ENCOUNTER — Other Ambulatory Visit: Payer: Self-pay

## 2023-10-02 MED ORDER — METRONIDAZOLE 250 MG PO TABS: 250.0000 mg | ORAL_TABLET | Freq: Three times a day (TID) | ORAL | 1 refills | Status: DC

## 2023-10-02 NOTE — Telephone Encounter (Signed)
 Pt requesting a prescription for flagyl for diarrhea and bloating, states he has taken this in the past and it worked well. Please advise.

## 2023-10-02 NOTE — Telephone Encounter (Signed)
 Please prescribe metronidazole as previously prescribed.  Also provide 1 additional refill.  Thanks

## 2023-10-02 NOTE — Telephone Encounter (Signed)
 Prescription sent to pharmacy as instructed with 1 refill, pt aware.

## 2023-10-11 DIAGNOSIS — M65332 Trigger finger, left middle finger: Secondary | ICD-10-CM | POA: Diagnosis not present

## 2023-10-11 DIAGNOSIS — S63591A Other specified sprain of right wrist, initial encounter: Secondary | ICD-10-CM | POA: Diagnosis not present

## 2023-11-14 DIAGNOSIS — S63591A Other specified sprain of right wrist, initial encounter: Secondary | ICD-10-CM | POA: Diagnosis not present

## 2023-11-14 DIAGNOSIS — M65332 Trigger finger, left middle finger: Secondary | ICD-10-CM | POA: Diagnosis not present

## 2023-11-18 ENCOUNTER — Other Ambulatory Visit: Payer: Self-pay | Admitting: Internal Medicine

## 2023-12-07 DIAGNOSIS — E1122 Type 2 diabetes mellitus with diabetic chronic kidney disease: Secondary | ICD-10-CM | POA: Diagnosis not present

## 2024-01-30 DIAGNOSIS — L218 Other seborrheic dermatitis: Secondary | ICD-10-CM | POA: Diagnosis not present

## 2024-01-30 DIAGNOSIS — D225 Melanocytic nevi of trunk: Secondary | ICD-10-CM | POA: Diagnosis not present

## 2024-01-30 DIAGNOSIS — L821 Other seborrheic keratosis: Secondary | ICD-10-CM | POA: Diagnosis not present

## 2024-01-30 DIAGNOSIS — L812 Freckles: Secondary | ICD-10-CM | POA: Diagnosis not present

## 2024-02-04 NOTE — Progress Notes (Signed)
 Chief Complaint: Bloating Primary GI MD: Dr. Elvin Hammer  HPI: Discussed the use of AI scribe software for clinical note transcription with the patient, who gave verbal consent to proceed.  History of Present Illness Dwayne Larsen is a 68 year old male with small intestinal bacterial overgrowth who presents with persistent bloating and gas.  He has been experiencing persistent bloating and gas for the past four to five weeks, primarily occurring in the mid to late afternoon. The sensation is described as feeling bloated 'like I just ate' and it takes all night to get relief. These symptoms occur about five days a week.  He recalls a history of similar symptoms in the past, which were effectively managed with Flagyl  (metronidazole ). However, he has not taken Flagyl  in over a year. Previously, Flagyl  provided relief within 24 hours and the symptoms would stay away for several months. He did not receive a prescription for Flagyl  this time, prompting his visit.  He denies any specific food triggers, noting that his diet is relatively clean with very little dairy, red meat, or fried foods. Despite dietary changes, including skipping lunch, the bloating persists. No heartburn, nausea, or vomiting. His bowel movements are mostly normal, sometimes loose but not watery.  He takes a probiotic once a day and a digestive aid with certain foods, though he feels these do not make a significant difference. He has tried avoiding certain foods like dairy and red meat, but the symptoms persist regardless of dietary changes.  He describes a sensation at his belly button that is 'squishy' and sometimes audible with fluid sounds. This has been present for the past two years and feels it has worsened over the last three to four months. He is concerned about the possibility of a blockage or other underlying issue.  He has undergone extensive testing in the past, including endoscopy, colonoscopy, and CT scans, which did not  reveal any significant findings. He feels uncomfortable and often wants to lie down when experiencing bloating.   PREVIOUS GI WORKUP   EGD 10/2021 per dysphagia, abdominal pain, GERD 1. GERD with erosive esophagitis  2. Mild stricturing and edema at the gastroesophageal junction  3. Gastric erosions.  4. Otherwise unremarkable EGD  Diagnosis 1. Surgical [P], colon nos, random sites BENIGN COLONIC MUCOSA WITH NO DIAGNOSTIC ABNORMALITY 2. Surgical [P], gastric antrum MILD FUNDIC GASTRITIS WITH REACTIVE EPITHELIAL CHANGES NEGATIVE FOR H. PYLORI, INTESTINAL METAPLASIA, DYSPLASIA AND CARCINOMA 3. Surgical [P], esophageal biopsies EOSINOPHIL-RICH ESOPHAGITIS (SEE MICROSCOPIC COMMENT) (greater than 30 eosinophils)  Colonoscopy 10/2021 - The examined portion of the ileum was normal.  - Marked diverticulosis in the left colon.  - The entire examined colon is normal on direct and retroflexion views. - Repeat 10 years (2033)  Past Medical History:  Diagnosis Date   Abdominal pain, left lower quadrant    Allergy    Allergy, unspecified not elsewhere classified    BPH (benign prostatic hyperplasia)    CAD (coronary artery disease) 03/07/2014   3.0 x 18 mm Xience drug-eluting stent.  The stent was postdilated with a 3.25 noncompliant balloon.   DJD (degenerative joint disease)    Elevated prostate specific antigen (PSA)    Gout    History of kidney stones    Hypercholesteremia    Lumbar back pain    Palpitations    PONV (postoperative nausea and vomiting)    nausea    Past Surgical History:  Procedure Laterality Date   COLONOSCOPY     CYSTOSCOPY  WITH LITHOLAPAXY N/A 01/13/2020   Procedure: CYSTOSCOPY WITH LITHOLAPAXY;  Surgeon: Marco Severs, MD;  Location: [facility];  Service: Urology;  Laterality: N/A;   KNEE ARTHROSCOPY     right   LAMINECTOMY AND MICRODISCECTOMY LUMBAR SPINE  2003   L4-5   LEFT HEART CATHETERIZATION WITH CORONARY ANGIOGRAM N/A  03/07/2014   Procedure: LEFT HEART CATHETERIZATION WITH CORONARY ANGIOGRAM;  Surgeon: Wenona Hamilton, MD;  Location: [facility] CATH LAB;  Service: Cardiovascular;  Laterality: N/A;   PERCUTANEOUS CORONARY STENT INTERVENTION (PCI-S)  03/07/2014   Procedure: PERCUTANEOUS CORONARY STENT INTERVENTION (PCI-S);  Surgeon: Wenona Hamilton, MD;  Location[facility] C[facility] LAB;  Service: Cardiovascular;;   RECTAL SURGERY  2003   Dr. Duwaine Gins   TRANSURETHRAL RESECTION OF PROSTATE N/A 01/13/2020   Procedure: TRANSURETHRAL RESECTION OF THE PROSTATE (TURP);  Surgeon: Marco Severs, MD;  Location: [facility];  Service: Urology;  Laterality: N/A;    Current Outpatient Medications  Medication Sig Dispense Refill   apixaban  (ELIQUIS ) 5 MG TABS tablet Take 1 tablet (5 mg total) by mouth 2 (two) times daily. 180 tablet 3   atorvastatin  (LIPITOR ) 80 MG tablet TAKE 1 TABLET BY MOUTH DAILY AT 6 PM 30 tablet 0   Evolocumab  (REPATHA  SURECLICK) 140 MG/ML SOAJ Inject 140 mg into the skin every 14 (fourteen) days. 6 mL 5   fenofibrate  160 MG tablet TAKE 1 TABLET(160 MG) BY MOUTH DAILY 90 tablet 3   metoprolol  tartrate (LOPRESSOR ) 25 MG tablet Take 0.5 tablets (12.5 mg total) by mouth 2 (two) times daily. 180 tablet 3   metroNIDAZOLE  (FLAGYL ) 250 MG tablet Take 1 tablet (250 mg total) by mouth 3 (three) times daily. 42 tablet 1   Multiple Vitamin (MULTIVITAMIN WITH MINERALS) TABS tablet Take 1 tablet by mouth daily.     nitroGLYCERIN  (NITROSTAT ) 0.4 MG SL tablet Place 1 tablet (0.4 mg total) under the tongue every 5 (five) minutes as needed for chest pain. 25 tablet 3   pantoprazole  (PROTONIX ) 40 MG tablet Take 1 tablet (40 mg total) by mouth daily. 30 tablet 1   zolpidem  (AMBIEN ) 10 MG tablet Take 10 mg by mouth at bedtime as needed for sleep. (Patient not taking: Reported on 03/02/2023)     No current facility-administered medications for this visit.    Allergies as of 02/05/2024 - Review Complete 03/02/2023   Allergen Reaction Noted   Other Itching and Rash 03/07/2014   Penicillins Other (See Comments)     Family History  Problem Relation Age of Onset   Diabetes Mother    Diverticulitis Mother    Coronary artery disease Father    Hyperlipidemia Father    Deep vein thrombosis Father    Prostate cancer Father    Colon cancer Neg Hx    Esophageal cancer Neg Hx    Pancreatic cancer Neg Hx    Stomach cancer Neg Hx     Social History   Socioeconomic History   Marital status: Married    Spouse name: Not on file   Number of children: 3   Years of education: Not on file   Highest education level: Not on file  Occupational History   Occupation: Scientist, research (medical)  Tobacco Use   Smoking status: Former    Current packs/day: 0.00    Average packs/day: 1 pack/day for 10.0 years (10.0 ttl pk-yrs)    Types: Cigarettes    Start date: 09/18/1970    Quit date: 09/18/1980  Years since quitting: 43.4   Smokeless tobacco: Never   Tobacco comments:    1/2-1 ppd, ages [age]  Vaping Use   Vaping status: Never Used  Substance and Sexual Activity   Alcohol  use: Yes    Comment: rare   Drug use: No   Sexual activity: Not on file  Other Topics Concern   Not on file  Social History Narrative   Not on file   Social Drivers of Health   Financial Resource Strain: Not on file  Food Insecurity: Not on file  Transportation Needs: Not on file  Physical Activity: Not on file  Stress: Not on file  Social Connections: Unknown (01/30/2022)   Received from [facility], [facility]   Social Network    Social Network: Not on file  Intimate Partner Violence: Unknown (12/22/2021)   Received from [employer], [facility]   HITS    Physically Hurt: Not on file    Insult or Talk Down To: Not on file    Threaten Physical Harm: Not on file    Scream or Curse: Not on file    Review of Systems:    Constitutional: No weight loss, fever, chills, weakness or fatigue HEENT: Eyes: No change in vision                Ears, Nose, Throat:  No change in hearing or congestion Skin: No rash or itching Cardiovascular: No chest pain, chest pressure or palpitations   Respiratory: No SOB or cough Gastrointestinal: See HPI and otherwise negative Genitourinary: No dysuria or change in urinary frequency Neurological: No headache, dizziness or syncope Musculoskeletal: No new muscle or joint pain Hematologic: No bleeding or bruising Psychiatric: No history of depression or anxiety    Physical Exam:  Vital signs: Ht 5\' 8"  (1.727 m)   Wt 182 lb (82.6 kg)   BMI 27.67 kg/m   Constitutional: NAD, alert and cooperative Head:  Normocephalic and atraumatic. Eyes:   PEERL, EOMI. No icterus. Conjunctiva pink. Respiratory: Respirations even and unlabored. Lungs clear to auscultation bilaterally.   No wheezes, crackles, or rhonchi.  Cardiovascular:  Regular rate and rhythm. No peripheral edema, cyanosis or pallor.  Gastrointestinal:  Soft, nondistended, nontender. No rebound or guarding. Normal bowel sounds. No appreciable masses or hepatomegaly. Rectal:  Declines Msk:  Symmetrical without gross deformities. Without edema, no deformity or joint abnormality.  Neurologic:  Alert and  oriented x4;  grossly normal neurologically.  Skin:   Dry and intact without significant lesions or rashes. Psychiatric: Oriented to person, place and time. Demonstrates good judgement and reason without abnormal affect or behaviors.  Physical Exam ABDOMEN: Normal bowel sounds. Small umbilical hernia, non-tender, reducible.   RELEVANT LABS AND IMAGING: CBC    Component Value Date/Time   WBC 6.4 09/27/2021 0949   RBC 5.66 09/27/2021 0949   HGB 15.4 09/27/2021 0949   HCT 46.4 09/27/2021 0949   PLT 190.0 09/27/2021 0949   MCV 81.9 09/27/2021 0949   MCH 26.1 01/29/2020 0226   MCHC 33.1 09/27/2021 0949   RDW 15.3 09/27/2021 0949   LYMPHSABS 1.1 09/27/2021 0949   MONOABS 0.6 09/27/2021 0949   EOSABS 0.2 09/27/2021 0949    BASOSABS 0.0 09/27/2021 0949    CMP     Component Value Date/Time   NA 135 09/27/2021 0949   NA 140 05/30/2018 0749   K 4.6 09/27/2021 0949   CL 104 09/27/2021 0949   CO2 23 09/27/2021 0949   GLUCOSE 178 (H) 09/27/2021  0949   BUN 22 09/27/2021 0949   BUN 21 05/30/2018 0749   CREATININE 1.55 (H) 09/27/2021 0949   CREATININE 1.32 (H) 01/04/2016 0740   CALCIUM  10.1 09/27/2021 0949   PROT 7.3 09/27/2021 0949   PROT 6.6 05/30/2018 0749   ALBUMIN 4.6 09/27/2021 0949   ALBUMIN 4.4 05/30/2018 0749   AST 28 09/27/2021 0949   ALT 37 09/27/2021 0949   ALKPHOS 77 09/27/2021 0949   BILITOT 0.6 09/27/2021 0949   BILITOT 0.5 05/30/2018 0749   GFRNONAA 31 (L) 01/29/2020 0226   GFRAA 36 (L) 01/29/2020 0226     Assessment/Plan:   Assessment and Plan Assessment & Plan Small intestinal bacterial overgrowth (SIBO) Chronic bloating and gas without diarrhea, responsive to Flagyl , suggests SIBO. Empirical Flagyl  treatment is reasonable. Probiotics may worsen symptoms. Low FODMAP diet may reduce symptoms. - Prescribe Flagyl  for 14 days. - Advise discontinuation of probiotics. - Provide low FODMAP diet guidelines. - Recommend simethicone as needed. - Advise against antibiotics within two months.  Umbilical hernia Small, non-painful, reducible hernia. No intervention unless painful or incarcerated. - Monitor for pain or changes. - Advise avoiding heavy lifting.   IBS-D Bloating Suspected SIBO Normal colonoscopy 10/2021, biopsies not obtained for microscopic colitis.  Last seen 12/2021 with 4 rounds of Flagyl  for intermittent diarrhea and bloating with improvement. Chronic bloating and gas without diarrhea, responsive to Flagyl , suggests SIBO. Negative celiac serology. Last round of flagyl  over one year ago per patient report. Continued bloating. Diet high in FODMAPS - Prescribe Flagyl  TID for 14 days x 1 refill. - Advise discontinuation of probiotics. - Provide low FODMAP diet  guidelines. - Recommend simethicone as needed.  Dysphagia/GERD EGD 11/2021 showing GERD with erosive esophagitis, mild stricturing at GE junction, and gastric erosions with biopsies negative for H. pylori but showing eosinophil rich esophagitis (greater than 30) No current GERD or dysphagia  Colon cancer screening Due for repeat 2033  CAD s/p STEMI and RCA PCI 2015 Echocardiogram 04/2023 with EF 55 to 60%  Paroxysmal A-fib On Eliquis   Umbilical hernia Small, non-painful, reducible hernia. No intervention unless painful or incarcerated. - Monitor for pain or changes. - Advise avoiding heavy lifting.  Gigi Kyle  Gastroenterology 02/05/2024, 9:13 AM  Cc: Margarete Sharps, MD

## 2024-02-05 ENCOUNTER — Encounter: Payer: Self-pay | Admitting: Gastroenterology

## 2024-02-05 ENCOUNTER — Ambulatory Visit: Admitting: Gastroenterology

## 2024-02-05 VITALS — Ht 68.0 in | Wt 182.0 lb

## 2024-02-05 DIAGNOSIS — K429 Umbilical hernia without obstruction or gangrene: Secondary | ICD-10-CM

## 2024-02-05 DIAGNOSIS — I480 Paroxysmal atrial fibrillation: Secondary | ICD-10-CM

## 2024-02-05 DIAGNOSIS — K638219 Small intestinal bacterial overgrowth, unspecified: Secondary | ICD-10-CM

## 2024-02-05 DIAGNOSIS — R140 Abdominal distension (gaseous): Secondary | ICD-10-CM | POA: Diagnosis not present

## 2024-02-05 DIAGNOSIS — K580 Irritable bowel syndrome with diarrhea: Secondary | ICD-10-CM | POA: Diagnosis not present

## 2024-02-05 DIAGNOSIS — I2510 Atherosclerotic heart disease of native coronary artery without angina pectoris: Secondary | ICD-10-CM

## 2024-02-05 MED ORDER — METRONIDAZOLE 250 MG PO TABS: 250.0000 mg | ORAL_TABLET | Freq: Three times a day (TID) | ORAL | 1 refills | Status: DC

## 2024-02-05 NOTE — Patient Instructions (Signed)
 We have sent the following medications to your pharmacy for you to pick up at your convenience:  Flagyl   _______________________________________________________  If your blood pressure at your visit was 140/90 or greater, please contact your primary care physician to follow up on this.  _______________________________________________________  If you are age 68 or older, your body mass index should be between 23-30. Your Body mass index is 27.67 kg/m. If this is out of the aforementioned range listed, please consider follow up with your Primary Care Provider.  If you are age 72 or younger, your body mass index should be between 19-25. Your Body mass index is 27.67 kg/m. If this is out of the aformentioned range listed, please consider follow up with your Primary Care Provider.   ________________________________________________________  The [facility] providers would like to encourage you to use MYCHART to communicate with providers for non-urgent requests or questions.  Due to long hold times on the telephone, sending your provider a message by [facility] may be a faster and more efficient way to get a response.  Please allow 48 business hours for a response.  Please remember that this is for non-urgent requests.  _______________________________________________________

## 2024-02-05 NOTE — Progress Notes (Signed)
 Noted

## 2024-02-20 ENCOUNTER — Other Ambulatory Visit: Payer: Self-pay | Admitting: Cardiology

## 2024-02-21 ENCOUNTER — Other Ambulatory Visit: Payer: Self-pay | Admitting: Nurse Practitioner

## 2024-02-21 DIAGNOSIS — R140 Abdominal distension (gaseous): Secondary | ICD-10-CM

## 2024-02-21 DIAGNOSIS — R197 Diarrhea, unspecified: Secondary | ICD-10-CM

## 2024-02-29 ENCOUNTER — Ambulatory Visit: Payer: Self-pay | Admitting: Cardiology

## 2024-03-20 ENCOUNTER — Other Ambulatory Visit: Payer: Self-pay | Admitting: Cardiology

## 2024-03-20 DIAGNOSIS — K469 Unspecified abdominal hernia without obstruction or gangrene: Secondary | ICD-10-CM | POA: Diagnosis not present

## 2024-03-24 DIAGNOSIS — M65332 Trigger finger, left middle finger: Secondary | ICD-10-CM | POA: Diagnosis not present

## 2024-03-24 DIAGNOSIS — S63632A Sprain of interphalangeal joint of right middle finger, initial encounter: Secondary | ICD-10-CM | POA: Diagnosis not present

## 2024-03-27 ENCOUNTER — Telehealth: Payer: Self-pay

## 2024-03-27 DIAGNOSIS — R140 Abdominal distension (gaseous): Secondary | ICD-10-CM | POA: Diagnosis not present

## 2024-03-27 DIAGNOSIS — K8020 Calculus of gallbladder without cholecystitis without obstruction: Secondary | ICD-10-CM | POA: Diagnosis not present

## 2024-03-27 DIAGNOSIS — R1013 Epigastric pain: Secondary | ICD-10-CM | POA: Diagnosis not present

## 2024-03-27 DIAGNOSIS — K429 Umbilical hernia without obstruction or gangrene: Secondary | ICD-10-CM | POA: Diagnosis not present

## 2024-03-27 NOTE — Telephone Encounter (Signed)
   Pre-operative Risk Assessment    Patient Name: Dwayne Larsen  DOB: Sep 13, 1956 MRN: [id number]   Date of last office visit: 03/02/23 [facility], MD Date of next office visit: NONE   Request for Surgical Clearance    Procedure:  UMBILICAL HERNIA REPAIR SURGERY  Date of Surgery:  Clearance TBD                                Surgeon:  CAMELLIA BLUSH, MD Surgeon's Group or Practice Name:  [facility] Phone number:  [phone] Fax number:  [phone]  ATTN: LEITA CORONA, CMA   Type of Clearance Requested:   - Medical  - Pharmacy:  Hold Apixaban  (Eliquis )     Type of Anesthesia:  General    Additional requests/questions:    Signed, Lucie DELENA Ku   03/27/2024, 1:32 PM

## 2024-03-28 NOTE — Telephone Encounter (Signed)
   Name: ARDEAN MELROY  DOB: [birth year]  MRN: [id number]  Primary Cardiologist: Aleene Passe, MD  Chart reviewed as part of pre-operative protocol coverage. Because of Morgon Pamer Aceituno's past medical history and time since last visit, he will require a follow-up in-office visit in order to better assess preoperative cardiovascular risk.  Pre-op covering staff: - Please schedule appointment and call patient to inform them. If patient already had an upcoming appointment within acceptable timeframe, please add pre-op clearance to the appointment notes so provider is aware. - Please contact requesting surgeon's office via preferred method (i.e, phone, fax) to inform them of need for appointment prior to surgery.  This message will also be routed to pharmacy pool for input on holding Eliquis  as requested below so that this information is available to the clearing provider at time of patient's appointment.   Devyn Griffing, [place]  03/28/2024, 12:10 PM

## 2024-03-28 NOTE — Telephone Encounter (Signed)
Clinical pharmacist to review Eliquis 

## 2024-03-28 NOTE — Telephone Encounter (Signed)
1st attempt to reach pt regarding surgical clearance and the need for an IN OFFICE appointment.  Left pt a detailed message to call back and get that scheduled.

## 2024-03-31 DIAGNOSIS — K469 Unspecified abdominal hernia without obstruction or gangrene: Secondary | ICD-10-CM | POA: Insufficient documentation

## 2024-03-31 DIAGNOSIS — E1122 Type 2 diabetes mellitus with diabetic chronic kidney disease: Secondary | ICD-10-CM | POA: Insufficient documentation

## 2024-03-31 DIAGNOSIS — R112 Nausea with vomiting, unspecified: Secondary | ICD-10-CM | POA: Insufficient documentation

## 2024-03-31 DIAGNOSIS — K8020 Calculus of gallbladder without cholecystitis without obstruction: Secondary | ICD-10-CM | POA: Insufficient documentation

## 2024-03-31 DIAGNOSIS — R140 Abdominal distension (gaseous): Secondary | ICD-10-CM | POA: Insufficient documentation

## 2024-03-31 DIAGNOSIS — D6869 Other thrombophilia: Secondary | ICD-10-CM | POA: Insufficient documentation

## 2024-03-31 DIAGNOSIS — H00014 Hordeolum externum left upper eyelid: Secondary | ICD-10-CM | POA: Insufficient documentation

## 2024-03-31 DIAGNOSIS — R109 Unspecified abdominal pain: Secondary | ICD-10-CM | POA: Insufficient documentation

## 2024-03-31 DIAGNOSIS — R197 Diarrhea, unspecified: Secondary | ICD-10-CM | POA: Insufficient documentation

## 2024-03-31 DIAGNOSIS — I129 Hypertensive chronic kidney disease with stage 1 through stage 4 chronic kidney disease, or unspecified chronic kidney disease: Secondary | ICD-10-CM | POA: Insufficient documentation

## 2024-03-31 NOTE — Telephone Encounter (Signed)
 Helping in preop today.  Will route FYI to [place] re: pharm recs below so she can include at time of OV. Otherwise will remove from preop APP box.

## 2024-03-31 NOTE — Telephone Encounter (Signed)
 Pt scheduled for OV on 04/02/24.

## 2024-03-31 NOTE — Telephone Encounter (Signed)
 Patient with diagnosis of A Fib on Eliquis   for anticoagulation.    Procedure:  UMBILICAL HERNIA REPAIR SURGERY  Date of procedure: TBD   CHA2DS2-VASc Score = 5  This indicates a 7.2% annual risk of stroke. The patient's score is based upon: CHF History: 1 HTN History: 1 Diabetes History: 1 Stroke History: 0 Vascular Disease History: 1 Age Score: 1 Gender Score: 0    CrCl 54 ml/min   Per office protocol, patient can hold Eliquis  for 2 days prior to procedure.    **This guidance is not considered finalized until pre-operative APP has relayed final recommendations.**

## 2024-04-02 ENCOUNTER — Encounter: Payer: Self-pay | Admitting: Cardiology

## 2024-04-02 ENCOUNTER — Ambulatory Visit: Attending: Cardiology | Admitting: Cardiology

## 2024-04-02 VITALS — BP 110/64 | HR 58 | Ht 68.0 in | Wt 178.4 lb

## 2024-04-02 DIAGNOSIS — I480 Paroxysmal atrial fibrillation: Secondary | ICD-10-CM | POA: Diagnosis not present

## 2024-04-02 DIAGNOSIS — I10 Essential (primary) hypertension: Secondary | ICD-10-CM | POA: Diagnosis not present

## 2024-04-02 DIAGNOSIS — Z01810 Encounter for preprocedural cardiovascular examination: Secondary | ICD-10-CM

## 2024-04-02 DIAGNOSIS — E782 Mixed hyperlipidemia: Secondary | ICD-10-CM | POA: Diagnosis not present

## 2024-04-02 DIAGNOSIS — I2510 Atherosclerotic heart disease of native coronary artery without angina pectoris: Secondary | ICD-10-CM

## 2024-04-02 NOTE — Progress Notes (Signed)
 Cardiology Office Note   Date:  04/02/2024  ID:  Dwayne Larsen, DOB 11-10-1955, MRN [id number] PCP: Onita Rush, MD  [facility] Providers Cardiologist:  Aleene Passe, MD     History of Present Illness Dwayne Larsen is a 68 y.o. male with a past medical history of hypertension, hyperlipidemia, coronary artery disease s/p STEMI with PCI to RCA (2015), ischemic cardiomyopathy status post STEMI (EF 45%), paroxysmal atrial fibrillation, who is here today for follow up and pre procedure cardiovascular examination.    Patient presented to [facility] with an acute inferior ST elevation myocardial infarction 03/07/2014.  Patient informed about left heart catheterization which revealed severe one-vessel coronary artery disease with thrombotic occlusion of the distal right coronary artery.  There was also moderate disease in the mid left circumflex, mid RCA, and right PDA.  Mildly reduced LV systolic function with severe inferior wall hypokinesis.  Mildly elevated left ventricular end-diastolic pressure.  Successful angioplasty and aspiration thrombectomy with drug-eluting stent placement to the right coronary artery.  Recommendations were to continue dual antiplatelet therapy for at least 1 year.  Start high-dose statin and a small dose of metoprolol .  Patient had an idioventricular rhythm throughout the case which was intermittent.  He was scheduled for an echocardiogram to assess LV function.  He tolerated small dose of metoprolol  and had limited NSVT after being on beta-blocker therapy.  Echocardiogram revealed normal LVEF and basal inferior wall akinesis.  Normal cavity size of the left ventricle.  Mild LVH, ejection fraction was 45%.  He was considered stable for discharge and was discharged from [facility] 03/09/2014.  He had followed with Dr. Passe until  September 2019 he was doing well from a cardiac perspective.  He had no further episodes of chest discomfort.  He was then evaluated by [facility] cardiovascular  with Dr. Elmira in December 2020 with concerns and complaints of palpitations.  It was recommended he be placed on a 4-week event monitor to exclude tacky arrhythmia.  He continued to do well without chest pain or shortness of breath.  On wearing the monitor he was found that he was having paroxysmal atrial fibrillation.  He was continued on metoprolol  tartrate 12.5 mg twice daily his CHA2DS2-VASc score was 2 and it was recommended anticoagulation and starting aspirin .  The patient declined anticoagulation and opted to stay on aspirin  therapy.  His event monitor revealed atrial fibrillation with RVR without associated symptoms.  Discussed his heart rate in A-fib with surprisingly at 208.  He had 3-second pauses postconversion from atrial fibrillation.  He remains stable from a cardiac perspective without chest pain or shortness of breath.  He was seen in clinic 03/02/2023 at that time he had further discussion of CHA2DS2-VASc of at least 3.  Discussion was had for anticoagulation patient was agreeable to starting apixaban  5 mg twice daily.  Aspirin  was discontinued to reduce bleeding risk at that time   He presents to clinic today for preprocedure cardiovascular examination.  He required pharmacy and cardiac clearance prior to hernia surgery.  Patient states he believes also going to remove his gallbladder when he undergoes hernia repair.  He states he has been well from a cardiac perspective without concerns of angina or decompensation.  Unfortunately further discussion about medications reveals that he has only been taking apixaban  once daily versus twice.  He denies any bleeding with no blood noted in his urine or stool.  Continues to remain active and just played golf yesterday.  Denies any hospitalizations  or visits to the emergency department.  ROS: 10 point review of systems has been reviewed and considered negative with the exception of what has been listed in the HPI  Studies Reviewed EKG  Interpretation Date/Time:  Wednesday April 02 2024 09:53:27 EDT Ventricular Rate:  58 PR Interval:  162 QRS Duration:  90 QT Interval:  434 QTC Calculation: 426 R Axis:   28  Text Interpretation: Sinus bradycardia Inferior infarct , age undetermined When compared with ECG of 29-Jan-2020 02:15, PREVIOUS ECG IS PRESENT Confirmed by Gerard Frederick ([id number]) on 04/02/2024 10:01:39 AM    Echocardiogram 04/19/2023:  Normal LV systolic function with visual EF 55-60%. Left ventricle cavity  is normal in size. Normal global wall motion. Indeterminate diastolic  filling pattern, normal LAP. Mild left ventricular hypertrophy.  Left atrial cavity is mildly dilated at 35.9 ml/m^2.  No significant valvular heart disease.  Compared to 2015: LVEF improved from 50% to 55-60%, RWMA not appreciated  on current study, mild LAE is new.   Event monitor 08/26/2019 - 09/24/2019:  Diagnostic time: 99%  Dominant rhythm: Sinus.  HR 46-208 bpm. Avg HR 70 bpm.  7 episodes of Afib w/RVR, associated with and without symptoms of flutter or skipped beats.  Afib burden: 2% event monitor time.  One post Afib conversion pause of 3 sec.  Occasional PVC's.  No atrial atrial flutter/SVT/VT/high grade AV block, sinus pause >3sec noted.     Echocardiogram 2015: - Left ventricle: Average global LV strain is -17.3% The cavity    size was normal. There was mild focal basal hypertrophy of the    septum. Systolic function was mildly reduced. The estimated    ejection fraction was 50%. There is akinesis of the    basal-midinferior myocardium. There is akinesis of the    basalinferoseptal myocardium.  - Mitral valve: There was mild regurgitation.    Coronary angiography and intervention 2015 (Dr. Darron): LM: Normal LAD: Prox diffuse 20% disease LCx: Mid 50% stenosis. Mild calcification RCA: Large, dominant. Mid 50% stenosis with distal occlusion with thrombus. Prox-mid PDA 60% stenosis.      Aspiration thrombectomy and PCI  dRCA 3.0 X 18 mm Xience DES   Risk Assessment/Calculations  CHA2DS2-VASc Score = 4   This indicates a 4.8% annual risk of stroke. The patient's score is based upon: CHF History: 1 HTN History: 1 Diabetes History: 0 Stroke History: 0 Vascular Disease History: 1 Age Score: 1 Gender Score: 0            Physical Exam VS:  BP 110/64 (BP Location: Left Arm, Patient Position: Sitting, Cuff Size: Normal)   Pulse (!) 58   Ht 5' 8 (1.727 m)   Wt 178 lb 6.4 oz (80.9 kg)   SpO2 98%   BMI 27.13 kg/m        Wt Readings from Last 3 Encounters:  04/02/24 178 lb 6.4 oz (80.9 kg)  02/05/24 182 lb (82.6 kg)  03/02/23 194 lb 6.4 oz (88.2 kg)    GEN: Well nourished, well developed in no acute distress NECK: No JVD; No carotid bruits CARDIAC: RRR, bradycardic, no murmurs, rubs, gallops RESPIRATORY:  Clear to auscultation without rales, wheezing or rhonchi  ABDOMEN: Soft, non-tender, non-distended EXTREMITIES:  No edema; No deformity   ASSESSMENT AND PLAN Coronary artery disease with history of MI and PCI/DES to the RCA in 2015.  EKG today reveals sinus bradycardia with a rate of 58 with an old inferior infarct with no acute changes.  He  denies any angina or anginal equivalents.  He has been continued on apixaban  and lieu of aspirin  and atorvastatin  80 mg daily, Repatha  140 mg every 2 weeks, and fenofibrate  160 mg daily.  He also continues to have Nitrostat  0.4 mg sublingual on as needed basis but has not required the use of the medication.  No further ischemic evaluation is needed at this time.  Primary hypertension with blood pressure today of 110/64.  He has been continued on metoprolol  tartrate 12.5 mg twice daily.  Blood pressure has been well-controlled.  Mixed hyperlipidemia where he is currently on atorvastatin , Repatha , fenofibrate .  Patient has not had a lipid panel completed since 2019 and will need updated lipid panel.  At that time can consider de-escalating atorvastatin .   Previously had a lipid panel ordered that was never completed.  Paroxysmal atrial fibrillation rate is maintaining sinus rhythm on EKG today.  He is continued on Toprol  12.5 mg twice daily and apixaban  5 mg twice daily for CHA2DS2-VASc score of at least 4 for stroke prophylaxis.  Unfortunately he was only taking apixaban  once daily.  He has been advised twice daily dosing is to prevent stroke.  He has been taking half of a dose when she was advised that he did not meet criteria for reduced dosing.  He did ask why his aspirin  was not sufficient.  Explained the difference between antiplatelet medication and oral anticoagulation.  States that he is not a fan of taking medications.  Advised that for surgery he has been allowed to hold it for 2 days.  He will be advised about surgery when to restart it but to discuss his concerns with his primary cardiologist on return.  Pre procedure cardiovascular examination    Mr. Mayhall perioperative risk of a major cardiac event is 6.6% according to the Revised Cardiac Risk Index (RCRI).  Therefore, he is at high risk for perioperative complications.   His functional capacity is excellent at 8.97 METs according to the Duke Activity Status Index (DASI). Recommendations: According to ACC/AHA guidelines, no further cardiovascular testing needed.  The patient may proceed to surgery at acceptable risk.   Antiplatelet and/or Anticoagulation Recommendations:  Eliquis  (Apixaban ) can be held for 2 days prior to surgery.  Please resume post op when felt to be safe.   Per pharmacy review completed on 03/27/2024.       Dispo: Patient to return to clinic to see MD/APP at previously scheduled appointment towards end of August or sooner if needed.  Signed, Jacey Eckerson, NP

## 2024-04-02 NOTE — Patient Instructions (Signed)
 Medication Instructions:  Your physician recommends that you continue on your current medications as directed. Please refer to the Current Medication list given to you today.   *If you need a refill on your cardiac medications before your next appointment, please call your pharmacy*  Lab Work: No labs ordered today  If you have labs (blood work) drawn today and your tests are completely normal, you will receive your results only by: MyChart Message (if you have MyChart) OR A paper copy in the mail If you have any lab test that is abnormal or we need to change your treatment, we will call you to review the results.  Testing/Procedures: No test ordered today   Follow-Up: At [facility], you and your health needs are our priority.  As part of our continuing mission to provide you with exceptional heart care, our providers are all part of one team.  This team includes your primary Cardiologist (physician) and Advanced Practice Providers or APPs (Physician Assistants and Nurse Practitioners) who all work together to provide you with the care you need, when you need it.  Your next appointment:   Keep current follow up appointment

## 2024-04-14 ENCOUNTER — Ambulatory Visit: Admitting: Cardiology

## 2024-04-17 DIAGNOSIS — I129 Hypertensive chronic kidney disease with stage 1 through stage 4 chronic kidney disease, or unspecified chronic kidney disease: Secondary | ICD-10-CM | POA: Diagnosis not present

## 2024-04-17 DIAGNOSIS — E1122 Type 2 diabetes mellitus with diabetic chronic kidney disease: Secondary | ICD-10-CM | POA: Diagnosis not present

## 2024-04-22 ENCOUNTER — Ambulatory Visit: Payer: Self-pay | Admitting: General Surgery

## 2024-04-22 ENCOUNTER — Other Ambulatory Visit: Payer: Self-pay | Admitting: Nurse Practitioner

## 2024-04-22 DIAGNOSIS — R140 Abdominal distension (gaseous): Secondary | ICD-10-CM

## 2024-04-22 DIAGNOSIS — R197 Diarrhea, unspecified: Secondary | ICD-10-CM

## 2024-04-22 DIAGNOSIS — Z7902 Long term (current) use of antithrombotics/antiplatelets: Secondary | ICD-10-CM

## 2024-04-22 DIAGNOSIS — K429 Umbilical hernia without obstruction or gangrene: Secondary | ICD-10-CM

## 2024-04-22 NOTE — Progress Notes (Signed)
 COVID Vaccine Completed:  Date of COVID positive in last 90 days:  PCP - Norleen Jungling, MD Cardiologist - Newman Lawrence, MD  Cardiac clearance by Tylene Lunch, NP 04/02/24 in Epic   Chest x-ray - n/a EKG - 04/02/24 Epic Stress Test - long time ago per pt ECHO - 04/19/23 Epic Cardiac Cath - 03/07/14 Pacemaker/ICD device last checked:n/a Spinal Cord Stimulator: n/a  Bowel Prep - no  Sleep Study - n/a CPAP -   Fasting Blood Sugar - patient denies any history, no meds  Checks Blood Sugar _____ times a day  Last dose of GLP1 agonist-  N/A GLP1 instructions:  Do not take after     Last dose of SGLT-2 inhibitors-  N/A SGLT-2 instructions:  Do not take after     Blood Thinner Instructions: Eliquis , hold 2 days Aspirin  Instructions: Last Dose: 05/03/24 2200  Activity level: Can go up a flight of stairs and perform activities of daily living without stopping and without symptoms of chest pain or shortness of breath.   Anesthesia review: CAD, CHF, HTN, cardiomyopathy, MI  Patient denies shortness of breath, fever, cough and chest pain at PAT appointment  Patient verbalized understanding of instructions that were given to them at the PAT appointment. Patient was also instructed that they will need to review over the PAT instructions again at home before surgery.

## 2024-04-22 NOTE — Patient Instructions (Signed)
 SURGICAL WAITING ROOM VISITATION  Patients having surgery or a procedure may have no more than 2 support people in the waiting area - these visitors may rotate.    Children under the age of 36 must have an adult with them who is not the patient.  Visitors with respiratory illnesses are discouraged from visiting and should remain at home.  If the patient needs to stay at the hospital during part of their recovery, the visitor guidelines for inpatient rooms apply. Pre-op nurse will coordinate an appropriate time for 1 support person to accompany patient in pre-op.  This support person may not rotate.    Please refer to the [facility] website for the visitor guidelines for Inpatients (after your surgery is over and you are in a regular room).    Your procedure is scheduled on: 05/06/24   Report to [facility] Main Entrance    Report to admitting at 5:15 AM   Call this number if you have problems the morning of surgery [phone]   Do not eat food :After Midnight.   After Midnight you may have the following liquids until 4:30 AM DAY OF SURGERY  Water Non-Citrus Juices (without pulp, NO RED-Apple, White grape, White cranberry) Black Coffee (NO MILK/CREAM OR CREAMERS, sugar ok)  Clear Tea (NO MILK/CREAM OR CREAMERS, sugar ok) regular and decaf                             Plain Jell-O (NO RED)                                           Fruit ices (not with fruit pulp, NO RED)                                     Popsicles (NO RED)                                                               Sports drinks like Gatorade (NO RED)          If you have questions, please contact your surgeon's office.   FOLLOW BOWEL PREP AND ANY ADDITIONAL PRE OP INSTRUCTIONS YOU RECEIVED FROM YOUR SURGEON'S OFFICE!!!     Oral Hygiene is also important to reduce your risk of infection.                                    Remember - BRUSH YOUR TEETH THE MORNING OF SURGERY WITH YOUR REGULAR  TOOTHPASTE  DENTURES WILL BE REMOVED PRIOR TO SURGERY PLEASE DO NOT APPLY Poly grip OR ADHESIVES!!!   Stop all vitamins and herbal supplements 7 days before surgery.   Do not take Eliquis  after 05/03/24   Take these medicines the morning of surgery with A SIP OF WATER: Fenofibrate , Metoprolol , Pantoprazole               You may not have any metal on your body including jewelry, and body piercing  Do not wear lotions, powders, cologne, or deodorant              Men may shave face and neck.   Do not bring valuables to the hospital. [facility] IS NOT             RESPONSIBLE   FOR VALUABLES.   Contacts, glasses, dentures or bridgework may not be worn into surgery.  DO NOT BRING YOUR HOME MEDICATIONS TO THE HOSPITAL. PHARMACY WILL DISPENSE MEDICATIONS LISTED ON YOUR MEDICATION LIST TO YOU DURING YOUR ADMISSION IN THE HOSPITAL!    Patients discharged on the day of surgery will not be allowed to drive home.  Someone NEEDS to stay with you for the first 24 hours after anesthesia.   Special Instructions: Bring a copy of your healthcare power of attorney and living will documents the day of surgery if you haven't scanned them before.              Please read over the following fact sheets you were given: IF YOU HAVE QUESTIONS ABOUT YOUR PRE-OP INSTRUCTIONS PLEASE CALL [phone].   If you received a COVID test during your pre-op visit  it is requested that you wear a mask when out in public, stay away from anyone that may not be feeling well and notify your surgeon if you develop symptoms. If you test positive for Covid or have been in contact with anyone that has tested positive in the last 10 days please notify you surgeon.    [facility] - Preparing for Surgery Before surgery, you can play an important role.  Because skin is not sterile, your skin needs to be as free of germs as possible.  You can reduce the number of germs on your skin by washing with CHG  (chlorahexidine gluconate) soap before surgery.  CHG is an antiseptic cleaner which kills germs and bonds with the skin to continue killing germs even after washing. Please DO NOT use if you have an allergy to CHG or antibacterial soaps.  If your skin becomes reddened/irritated stop using the CHG and inform your nurse when you arrive at Short Stay. Do not shave (including legs and underarms) for at least 48 hours prior to the first CHG shower.  You may shave your face/neck.  Please follow these instructions carefully:  1.  Shower with CHG Soap the night before surgery and the  morning of surgery.  2.  If you choose to wash your hair, wash your hair first as usual with your normal  shampoo.  3.  After you shampoo, rinse your hair and body thoroughly to remove the shampoo.                             4.  Use CHG as you would any other liquid soap.  You can apply chg directly to the skin and wash.  Gently with a scrungie or clean washcloth.  5.  Apply the CHG Soap to your body ONLY FROM THE NECK DOWN.   Do   not use on face/ open                           Wound or open sores. Avoid contact with eyes, ears mouth and   genitals (private parts).  Wash face,  Genitals (private parts) with your normal soap.             6.  Wash thoroughly, paying special attention to the area where your    surgery  will be performed.  7.  Thoroughly rinse your body with warm water from the neck down.  8.  DO NOT shower/wash with your normal soap after using and rinsing off the CHG Soap.                9.  Pat yourself dry with a clean towel.            10.  Wear clean pajamas.            11.  Place clean sheets on your bed the night of your first shower and do not  sleep with pets. Day of Surgery : Do not apply any lotions/deodorants the morning of surgery.  Please wear clean clothes to the hospital/surgery center.  FAILURE TO FOLLOW THESE INSTRUCTIONS MAY RESULT IN THE CANCELLATION OF YOUR  SURGERY  PATIENT SIGNATURE_________________________________  NURSE SIGNATURE__________________________________  ________________________________________________________________________

## 2024-04-24 ENCOUNTER — Encounter (HOSPITAL_COMMUNITY): Payer: Self-pay

## 2024-04-24 ENCOUNTER — Other Ambulatory Visit: Payer: Self-pay

## 2024-04-24 ENCOUNTER — Encounter (HOSPITAL_COMMUNITY)
Admission: RE | Admit: 2024-04-24 | Discharge: 2024-04-24 | Disposition: A | Discharge status: Home / Self Care | Source: Ambulatory Visit | Attending: General Surgery | Admitting: General Surgery

## 2024-04-24 VITALS — BP 142/79 | HR 60 | Temp 97.7°F | Resp 16 | Ht 68.0 in | Wt 176.0 lb

## 2024-04-24 DIAGNOSIS — K8020 Calculus of gallbladder without cholecystitis without obstruction: Secondary | ICD-10-CM | POA: Diagnosis not present

## 2024-04-24 DIAGNOSIS — Z7902 Long term (current) use of antithrombotics/antiplatelets: Secondary | ICD-10-CM

## 2024-04-24 DIAGNOSIS — I255 Ischemic cardiomyopathy: Secondary | ICD-10-CM | POA: Insufficient documentation

## 2024-04-24 DIAGNOSIS — Z7901 Long term (current) use of anticoagulants: Secondary | ICD-10-CM | POA: Insufficient documentation

## 2024-04-24 DIAGNOSIS — I252 Old myocardial infarction: Secondary | ICD-10-CM | POA: Insufficient documentation

## 2024-04-24 DIAGNOSIS — Z87891 Personal history of nicotine dependence: Secondary | ICD-10-CM | POA: Diagnosis not present

## 2024-04-24 DIAGNOSIS — R140 Abdominal distension (gaseous): Secondary | ICD-10-CM | POA: Insufficient documentation

## 2024-04-24 DIAGNOSIS — Z01812 Encounter for preprocedural laboratory examination: Secondary | ICD-10-CM | POA: Diagnosis not present

## 2024-04-24 DIAGNOSIS — N400 Enlarged prostate without lower urinary tract symptoms: Secondary | ICD-10-CM | POA: Diagnosis not present

## 2024-04-24 DIAGNOSIS — K429 Umbilical hernia without obstruction or gangrene: Secondary | ICD-10-CM | POA: Insufficient documentation

## 2024-04-24 DIAGNOSIS — E1122 Type 2 diabetes mellitus with diabetic chronic kidney disease: Secondary | ICD-10-CM

## 2024-04-24 DIAGNOSIS — Z955 Presence of coronary angioplasty implant and graft: Secondary | ICD-10-CM | POA: Diagnosis not present

## 2024-04-24 DIAGNOSIS — I2510 Atherosclerotic heart disease of native coronary artery without angina pectoris: Secondary | ICD-10-CM | POA: Insufficient documentation

## 2024-04-24 LAB — COMPREHENSIVE METABOLIC PANEL WITH GFR
ALT: 18 U/L (ref 0–44)
AST: 17 U/L (ref 15–41)
Albumin: 3.9 g/dL (ref 3.5–5.0)
Alkaline Phosphatase: 76 U/L (ref 38–126)
Anion gap: 10 (ref 5–15)
BUN: 20 mg/dL (ref 8–23)
CO2: 26 mmol/L (ref 22–32)
Calcium: 9.8 mg/dL (ref 8.9–10.3)
Chloride: 103 mmol/L (ref 98–111)
Creatinine, Ser: 1.44 mg/dL — ABNORMAL HIGH (ref 0.61–1.24)
GFR, Estimated: 53 mL/min — ABNORMAL LOW (ref >60)
Glucose, Bld: 125 mg/dL — ABNORMAL HIGH (ref 70–99)
Potassium: 4.5 mmol/L (ref 3.5–5.1)
Sodium: 139 mmol/L (ref 135–145)
Total Bilirubin: 1 mg/dL (ref 0.0–1.2)
Total Protein: 7.1 g/dL (ref 6.5–8.1)

## 2024-04-24 LAB — CBC WITH DIFFERENTIAL/PLATELET
Abs Immature Granulocytes: 0.04 K/uL (ref 0.00–0.07)
Basophils Absolute: 0 K/uL (ref 0.0–0.1)
Basophils Relative: 0 %
Eosinophils Absolute: 0.1 K/uL (ref 0.0–0.5)
Eosinophils Relative: 2 %
HCT: 47.2 % (ref 39.0–52.0)
Hemoglobin: 14.8 g/dL (ref 13.0–17.0)
Immature Granulocytes: 1 %
Lymphocytes Relative: 14 %
Lymphs Abs: 0.9 K/uL (ref 0.7–4.0)
MCH: 27 pg (ref 26.0–34.0)
MCHC: 31.4 g/dL (ref 30.0–36.0)
MCV: 86.1 fL (ref 80.0–100.0)
Monocytes Absolute: 0.5 K/uL (ref 0.1–1.0)
Monocytes Relative: 9 %
Neutro Abs: 4.6 K/uL (ref 1.7–7.7)
Neutrophils Relative %: 74 %
Platelets: 199 K/uL (ref 150–400)
RBC: 5.48 MIL/uL (ref 4.22–5.81)
RDW: 14.4 % (ref 11.5–15.5)
WBC: 6.2 K/uL (ref 4.0–10.5)
nRBC: 0 % (ref 0.0–0.2)

## 2024-04-24 LAB — PROTIME-INR
INR: 1.2 (ref 0.8–1.2)
Prothrombin Time: 16.1 s — ABNORMAL HIGH (ref 11.4–15.2)

## 2024-04-24 LAB — APTT: aPTT: 24 s (ref 24–36)

## 2024-04-25 NOTE — Progress Notes (Signed)
 Anesthesia Chart Review   Case: [id number] Date/Time: 05/07/24 0815   Procedure: LAPAROSCOPIC CHOLECYSTECTOMY - WITH ICG DYE   Anesthesia type: General   Diagnosis:      Calculus of gallbladder without cholecystitis without obstruction [K80.20]     Umbilical hernia without obstruction and without gangrene [K42.9]   Pre-op diagnosis: GALLSTONES UMBILICAL HERNIA   Location: [facility] ROOM 04 / [facility]   Surgeons: Tanda Locus, MD       DISCUSSION:68 y.o. former smoker with h/o PONV, PAF, CAD s/p STEMI with PCI to RCA (2015), ischemic cardiomyopathy status post STEMI (EF 45%), BPH, gallstones, umbilical hernia scheduled for above procedure 05/07/2024 with Dr. Locus Tanda.   Pt last seen by cardiology 04/02/2024. Per OV note, Mr. Merrow perioperative risk of a major cardiac event is 6.6% according to the Revised Cardiac Risk Index (RCRI).  Therefore, he is at high risk for perioperative complications.   His functional capacity is excellent at 8.97 METs according to the Duke Activity Status Index (DASI). Recommendations: According to ACC/AHA guidelines, no further cardiovascular testing needed.  The patient may proceed to surgery at acceptable risk.   Antiplatelet and/or Anticoagulation Recommendations:   Eliquis  (Apixaban ) can be held for 2 days prior to surgery.  Please resume post op when felt to be safe.   Per pharmacy review completed on 03/27/2024.  VS: BP (!) 142/79   Pulse 60   Temp 36.5 C (Oral)   Resp 16   Ht 5' 8 (1.727 m)   Wt 79.8 kg   SpO2 98%   BMI 26.76 kg/m   PROVIDERS: Onita Rush, MD is PCP   Cardiologist - Newman Lawrence, MD  LABS: Labs reviewed: Acceptable for surgery. (all labs ordered are listed, but only abnormal results are displayed)  Labs Reviewed  COMPREHENSIVE METABOLIC PANEL WITH GFR - Abnormal; Notable for the following components:      Result Value   Glucose, Bld 125 (*)    Creatinine, Ser 1.44 (*)    GFR, Estimated 53 (*)    All other components  within normal limits  PROTIME-INR - Abnormal; Notable for the following components:   Prothrombin Time 16.1 (*)    All other components within normal limits  CBC WITH DIFFERENTIAL/PLATELET  APTT     IMAGES:   EKG:   CV: Echocardiogram 04/19/2023: Normal LV systolic function with visual EF 55-60%. Left ventricle cavity is normal in size. Normal global wall motion. Indeterminate diastolic filling pattern, normal LAP. Mild left ventricular hypertrophy. Left atrial cavity is mildly dilated at 35.9 ml/m^2. No significant valvular heart disease. Compared to 2015: LVEF improved from 50% to 55-60%, RWMA not appreciated on current study, mild LAE is new.   Past Medical History:  Diagnosis Date   Abdominal pain, left lower quadrant    Allergy    Allergy, unspecified not elsewhere classified    BPH (benign prostatic hyperplasia)    CAD (coronary artery disease) 03/07/2014   3.0 x 18 mm Xience drug-eluting stent.  The stent was postdilated with a 3.25 noncompliant balloon.   DJD (degenerative joint disease)    Elevated prostate specific antigen (PSA)    Gout    History of kidney stones    Hypercholesteremia    Lumbar back pain    Palpitations    PONV (postoperative nausea and vomiting)    nausea    Past Surgical History:  Procedure Laterality Date   COLONOSCOPY     CYSTOSCOPY WITH LITHOLAPAXY N/A 01/13/2020   Procedure: CYSTOSCOPY  WITH LITHOLAPAXY;  Surgeon: Sherrilee Belvie CROME, MD;  Location: [facility];  Service: Urology;  Laterality: N/A;   KNEE ARTHROSCOPY     right   LAMINECTOMY AND MICRODISCECTOMY LUMBAR SPINE  2003   L4-5   LEFT HEART CATHETERIZATION WITH CORONARY ANGIOGRAM N/A 03/07/2014   Procedure: LEFT HEART CATHETERIZATION WITH CORONARY ANGIOGRAM;  Surgeon: Deatrice DELENA Cage, MD;  Location: [facility] CATH LAB;  Service: Cardiovascular;  Laterality: N/A;   PERCUTANEOUS CORONARY STENT INTERVENTION (PCI-S)  03/07/2014   Procedure: PERCUTANEOUS CORONARY  STENT INTERVENTION (PCI-S);  Surgeon: Deatrice DELENA Cage, MD;  Location: [facility] CATH LAB;  Service: Cardiovascular;;   RECTAL SURGERY  2003   Dr. Effie   TRANSURETHRAL RESECTION OF PROSTATE N/A 01/13/2020   Procedure: TRANSURETHRAL RESECTION OF THE PROSTATE (TURP);  Surgeon: Sherrilee Belvie CROME, MD;  Location: [facility];  Service: Urology;  Laterality: N/A;    MEDICATIONS:  apixaban  (ELIQUIS ) 5 MG TABS tablet   Evolocumab  (REPATHA  SURECLICK) 140 MG/ML SOAJ   fenofibrate  160 MG tablet   metoprolol  tartrate (LOPRESSOR ) 25 MG tablet   Multiple Vitamin (MULTIVITAMIN WITH MINERALS) TABS tablet   nitroGLYCERIN  (NITROSTAT ) 0.4 MG SL tablet   pantoprazole  (PROTONIX ) 40 MG tablet   zolpidem  (AMBIEN ) 10 MG tablet   No current facility-administered medications for this encounter.    Harlene Hoots Ward, PA-C [facility] Pre-Surgical Testing [phone]

## 2024-04-25 NOTE — Anesthesia Preprocedure Evaluation (Addendum)
 Anesthesia Evaluation  Patient identified by MRN, date of birth, ID band  Reviewed: Allergy & Precautions, H&P , NPO status , Patient's Chart, lab work & pertinent test results  History of Anesthesia Complications (+) PONV and history of anesthetic complications  Airway Mallampati: II  TM Distance: >3 FB Neck ROM: Full    Dental no notable dental hx.    Pulmonary neg sleep apnea, former smoker   Pulmonary exam normal breath sounds clear to auscultation       Cardiovascular hypertension, + CAD, + Past MI, + Cardiac Stents and +CHF  Normal cardiovascular exam+ dysrhythmias Atrial Fibrillation  Rhythm:Regular Rate:Normal  Echocardiogram 04/19/2023: Normal LV systolic function with visual EF 55-60%. Left ventricle cavity is normal in size. Normal global wall motion. Indeterminate diastolic filling pattern, normal LAP. Mild left ventricular hypertrophy. Left atrial cavity is mildly dilated at 35.9 ml/m^2. No significant valvular heart disease. Compared to 2015: LVEF improved from 50% to 55-60%, RWMA not appreciated on current study, mild LAE is new.     Neuro/Psych  PSYCHIATRIC DISORDERS Anxiety     negative neurological ROS     GI/Hepatic Neg liver ROS,,,gallstones   Endo/Other  diabetes    Renal/GU CRFRenal disease  negative genitourinary   Musculoskeletal  (+) Arthritis , Osteoarthritis,    Abdominal   Peds negative pediatric ROS (+)  Hematology negative hematology ROS (+)   Anesthesia Other Findings   Reproductive/Obstetrics negative OB ROS                              Anesthesia Physical Anesthesia Plan  ASA: 3  Anesthesia Plan: General   Post-op Pain Management: Tylenol  PO (pre-op)*   Induction: Intravenous  PONV Risk Score and Plan: 3 and Ondansetron , Dexamethasone  and Treatment may vary due to age or medical condition  Airway Management Planned: Oral ETT  Additional  Equipment: None  Intra-op Plan:   Post-operative Plan: Extubation in OR  Informed Consent: I have reviewed the patients History and Physical, chart, labs and discussed the procedure including the risks, benefits and alternatives for the proposed anesthesia with the patient or authorized representative who has indicated his/her understanding and acceptance.     Dental advisory given  Plan Discussed with: CRNA  Anesthesia Plan Comments: (See PAT note 04/24/2024 68 y.o. former smoker with h/o PONV, PAF, CAD s/p STEMI with PCI to RCA (2015), ischemic cardiomyopathy status post STEMI (EF 45%), BPH, gallstones, umbilical hernia scheduled for above procedure 05/07/2024 with Dr. Camellia Blush.    Pt last seen by cardiology 04/02/2024. Per OV note, Mr. Sivertsen perioperative risk of a major cardiac event is 6.6% according to the Revised Cardiac Risk Index (RCRI).  Therefore, he is at high risk for perioperative complications.   His functional capacity is excellent at 8.97 METs according to the Duke Activity Status Index (DASI). Recommendations: According to ACC/AHA guidelines, no further cardiovascular testing needed.  The patient may proceed to surgery at acceptable risk.   Antiplatelet and/or Anticoagulation Recommendations:   Eliquis  (Apixaban ) can be held for 2 days prior to surgery.  Please resume post op when felt to be safe.   Per pharmacy review completed on 03/27/2024. )         Anesthesia Quick Evaluation

## 2024-04-27 ENCOUNTER — Other Ambulatory Visit: Payer: Self-pay | Admitting: Cardiology

## 2024-04-27 DIAGNOSIS — E782 Mixed hyperlipidemia: Secondary | ICD-10-CM

## 2024-04-27 DIAGNOSIS — I2510 Atherosclerotic heart disease of native coronary artery without angina pectoris: Secondary | ICD-10-CM

## 2024-04-30 NOTE — Progress Notes (Signed)
 Attempted to obtain medical history via telephone, unable to reach at this time. HIPAA compliant voicemail message left requesting return call to pre surgical testing department.

## 2024-05-01 ENCOUNTER — Telehealth: Payer: Self-pay | Admitting: Pharmacy Technician

## 2024-05-01 ENCOUNTER — Other Ambulatory Visit (HOSPITAL_COMMUNITY): Payer: Self-pay

## 2024-05-01 ENCOUNTER — Telehealth: Payer: Self-pay | Admitting: Cardiology

## 2024-05-01 NOTE — Telephone Encounter (Signed)
 PA request has been Submitted. New Encounter has been or will be created for follow up. For additional info see Pharmacy Prior Auth telephone encounter from 05/01/24.

## 2024-05-01 NOTE — Telephone Encounter (Signed)
 Pt c/o medication issue:  1. Name of Medication:   REPATHA  SURECLICK 140 MG/ML SOAJ    2. How are you currently taking this medication (dosage and times per day)? As written   3. Are you having a reaction (difficulty breathing--STAT)? No   4. What is your medication issue? Pharmacy called in stating pt needs prior auth for this medication

## 2024-05-01 NOTE — Telephone Encounter (Signed)
 Pharmacy Patient Advocate Encounter  Received notification from [facility] that Prior Authorization for repatha  has been APPROVED from 05/01/24 to 05/01/25   PA #/Case ID/Reference #: [id number]

## 2024-05-01 NOTE — Progress Notes (Signed)
 Updated date of surgery:  05/07/24  Updated time of arrival: 615 AM  Patient will be discharged from hospital and monitored at home for 24 hours by: [place] (wife) [phone]   Will not take Eliquis  after 05/04/24  Will not eat food after midnight, May have liquids until 5:30 AM morning of surgery.  Patient denies any changes in allergies, medications, medical history since pre op appointment on: 04/24/24  Pre op instructions reviewed, follow up questions addressed and patient verbalized understanding at this time.

## 2024-05-01 NOTE — Telephone Encounter (Signed)
 Per pt calls-[id number] -[id number]  Pharmacy Patient Advocate Encounter   Received notification from Pt Calls Messages that prior authorization for REPATHA  is required/requested.   Insurance verification completed.   The patient is insured through [facility] .   Per test claim: PA required; PA submitted to above mentioned insurance via Latent Key/confirmation #/EOC [id number] Status is pending

## 2024-05-06 ENCOUNTER — Ambulatory Visit: Admitting: Cardiology

## 2024-05-07 ENCOUNTER — Other Ambulatory Visit: Payer: Self-pay

## 2024-05-07 ENCOUNTER — Ambulatory Visit (HOSPITAL_COMMUNITY)
Admission: RE | Admit: 2024-05-07 | Discharge: 2024-05-07 | Disposition: A | Discharge status: Home / Self Care | Source: Ambulatory Visit | Attending: General Surgery | Admitting: General Surgery

## 2024-05-07 ENCOUNTER — Ambulatory Visit (HOSPITAL_BASED_OUTPATIENT_CLINIC_OR_DEPARTMENT_OTHER): Payer: Self-pay | Admitting: Anesthesiology

## 2024-05-07 ENCOUNTER — Ambulatory Visit (HOSPITAL_COMMUNITY): Payer: Self-pay | Admitting: Physician Assistant

## 2024-05-07 ENCOUNTER — Encounter (HOSPITAL_COMMUNITY)
Admission: RE | Disposition: A | Discharge status: Home / Self Care | Payer: Self-pay | Source: Ambulatory Visit | Attending: General Surgery

## 2024-05-07 ENCOUNTER — Encounter (HOSPITAL_COMMUNITY): Payer: Self-pay | Admitting: General Surgery

## 2024-05-07 DIAGNOSIS — Z955 Presence of coronary angioplasty implant and graft: Secondary | ICD-10-CM | POA: Insufficient documentation

## 2024-05-07 DIAGNOSIS — Z87891 Personal history of nicotine dependence: Secondary | ICD-10-CM | POA: Insufficient documentation

## 2024-05-07 DIAGNOSIS — I4891 Unspecified atrial fibrillation: Secondary | ICD-10-CM | POA: Insufficient documentation

## 2024-05-07 DIAGNOSIS — K8010 Calculus of gallbladder with chronic cholecystitis without obstruction: Secondary | ICD-10-CM | POA: Insufficient documentation

## 2024-05-07 DIAGNOSIS — E1122 Type 2 diabetes mellitus with diabetic chronic kidney disease: Secondary | ICD-10-CM | POA: Insufficient documentation

## 2024-05-07 DIAGNOSIS — I252 Old myocardial infarction: Secondary | ICD-10-CM | POA: Insufficient documentation

## 2024-05-07 DIAGNOSIS — K429 Umbilical hernia without obstruction or gangrene: Secondary | ICD-10-CM

## 2024-05-07 DIAGNOSIS — I2510 Atherosclerotic heart disease of native coronary artery without angina pectoris: Secondary | ICD-10-CM | POA: Diagnosis not present

## 2024-05-07 DIAGNOSIS — M1990 Unspecified osteoarthritis, unspecified site: Secondary | ICD-10-CM | POA: Insufficient documentation

## 2024-05-07 DIAGNOSIS — Z79899 Other long term (current) drug therapy: Secondary | ICD-10-CM | POA: Insufficient documentation

## 2024-05-07 DIAGNOSIS — N189 Chronic kidney disease, unspecified: Secondary | ICD-10-CM | POA: Diagnosis not present

## 2024-05-07 DIAGNOSIS — I130 Hypertensive heart and chronic kidney disease with heart failure and stage 1 through stage 4 chronic kidney disease, or unspecified chronic kidney disease: Secondary | ICD-10-CM | POA: Insufficient documentation

## 2024-05-07 DIAGNOSIS — Z7901 Long term (current) use of anticoagulants: Secondary | ICD-10-CM | POA: Insufficient documentation

## 2024-05-07 DIAGNOSIS — Z833 Family history of diabetes mellitus: Secondary | ICD-10-CM | POA: Diagnosis not present

## 2024-05-07 DIAGNOSIS — K8070 Calculus of gallbladder and bile duct without cholecystitis without obstruction: Secondary | ICD-10-CM | POA: Diagnosis not present

## 2024-05-07 DIAGNOSIS — Z8249 Family history of ischemic heart disease and other diseases of the circulatory system: Secondary | ICD-10-CM | POA: Diagnosis not present

## 2024-05-07 DIAGNOSIS — I509 Heart failure, unspecified: Secondary | ICD-10-CM | POA: Insufficient documentation

## 2024-05-07 DIAGNOSIS — K8020 Calculus of gallbladder without cholecystitis without obstruction: Secondary | ICD-10-CM | POA: Diagnosis not present

## 2024-05-07 HISTORY — PX: CHOLECYSTECTOMY: SHX55

## 2024-05-07 HISTORY — PX: UMBILICAL HERNIA REPAIR: SHX196

## 2024-05-07 LAB — GLUCOSE, CAPILLARY
Glucose-Capillary: 112 mg/dL — ABNORMAL HIGH (ref 70–99)
Glucose-Capillary: 180 mg/dL — ABNORMAL HIGH (ref 70–99)

## 2024-05-07 SURGERY — LAPAROSCOPIC CHOLECYSTECTOMY
Anesthesia: General | Site: Abdomen

## 2024-05-07 MED ORDER — ONDANSETRON HCL 4 MG/2ML IJ SOLN
INTRAMUSCULAR | Status: AC
  Filled 2024-05-07: qty 2

## 2024-05-07 MED ORDER — ROCURONIUM BROMIDE 10 MG/ML (PF) SYRINGE
PREFILLED_SYRINGE | INTRAVENOUS | Status: AC
  Filled 2024-05-07: qty 10

## 2024-05-07 MED ORDER — ACETAMINOPHEN 10 MG/ML IV SOLN
INTRAVENOUS | Status: AC
  Filled 2024-05-07: qty 100

## 2024-05-07 MED ORDER — CIPROFLOXACIN IN D5W 400 MG/200ML IV SOLN
400.0000 mg | INTRAVENOUS | Status: AC
  Administered 2024-05-07: 400 mg via INTRAVENOUS
  Filled 2024-05-07: qty 200

## 2024-05-07 MED ORDER — LIDOCAINE HCL (PF) 2 % IJ SOLN
INTRAMUSCULAR | Status: DC | PRN
  Administered 2024-05-07: 80 mg via INTRADERMAL

## 2024-05-07 MED ORDER — CHLORHEXIDINE GLUCONATE CLOTH 2 % EX PADS: 6.0000 | MEDICATED_PAD | Freq: Once | CUTANEOUS | Status: DC

## 2024-05-07 MED ORDER — FENTANYL CITRATE PF 50 MCG/ML IJ SOSY
PREFILLED_SYRINGE | INTRAMUSCULAR | Status: AC
  Filled 2024-05-07: qty 1

## 2024-05-07 MED ORDER — DROPERIDOL 2.5 MG/ML IJ SOLN
INTRAMUSCULAR | Status: AC
  Filled 2024-05-07: qty 2

## 2024-05-07 MED ORDER — DEXAMETHASONE SODIUM PHOSPHATE 10 MG/ML IJ SOLN
INTRAMUSCULAR | Status: DC | PRN
  Administered 2024-05-07: 8 mg via INTRAVENOUS

## 2024-05-07 MED ORDER — ACETAMINOPHEN 10 MG/ML IV SOLN: 1000.0000 mg | Freq: Once | INTRAVENOUS | Status: DC | PRN

## 2024-05-07 MED ORDER — SUGAMMADEX SODIUM 200 MG/2ML IV SOLN
INTRAVENOUS | Status: AC
  Filled 2024-05-07: qty 2

## 2024-05-07 MED ORDER — PROPOFOL 10 MG/ML IV BOLUS
INTRAVENOUS | Status: AC
Start: 2024-05-07 — End: 2024-05-07
  Filled 2024-05-07: qty 20

## 2024-05-07 MED ORDER — TRAMADOL HCL 50 MG PO TABS: 50.0000 mg | ORAL_TABLET | Freq: Four times a day (QID) | ORAL | 0 refills | Status: DC | PRN

## 2024-05-07 MED ORDER — DROPERIDOL 2.5 MG/ML IJ SOLN
0.6250 mg | Freq: Once | INTRAMUSCULAR | Status: AC | PRN
  Administered 2024-05-07: 0.625 mg via INTRAVENOUS

## 2024-05-07 MED ORDER — PROPOFOL 10 MG/ML IV BOLUS
INTRAVENOUS | Status: DC | PRN
  Administered 2024-05-07: 150 mg via INTRAVENOUS

## 2024-05-07 MED ORDER — OXYCODONE HCL 5 MG PO TABS
5.0000 mg | ORAL_TABLET | Freq: Once | ORAL | Status: AC | PRN
  Administered 2024-05-07: 5 mg via ORAL

## 2024-05-07 MED ORDER — BUPIVACAINE-EPINEPHRINE (PF) 0.25% -1:200000 IJ SOLN
INTRAMUSCULAR | Status: AC
  Filled 2024-05-07: qty 30

## 2024-05-07 MED ORDER — MIDAZOLAM HCL 2 MG/2ML IJ SOLN
INTRAMUSCULAR | Status: DC | PRN
Start: 2024-05-07 — End: 2024-05-07
  Administered 2024-05-07: 2 mg via INTRAVENOUS

## 2024-05-07 MED ORDER — LACTATED RINGERS IV SOLN: INTRAVENOUS | Status: DC

## 2024-05-07 MED ORDER — ORAL CARE MOUTH RINSE: 15.0000 mL | Freq: Once | OROMUCOSAL | Status: AC

## 2024-05-07 MED ORDER — CHLORHEXIDINE GLUCONATE 0.12 % MT SOLN
15.0000 mL | Freq: Once | OROMUCOSAL | Status: AC
  Administered 2024-05-07: 15 mL via OROMUCOSAL

## 2024-05-07 MED ORDER — ACETAMINOPHEN 500 MG PO TABS: 1000.0000 mg | ORAL_TABLET | Freq: Three times a day (TID) | ORAL | Status: AC

## 2024-05-07 MED ORDER — FENTANYL CITRATE PF 50 MCG/ML IJ SOSY
25.0000 ug | PREFILLED_SYRINGE | INTRAMUSCULAR | Status: DC | PRN
  Administered 2024-05-07: 50 ug via INTRAVENOUS

## 2024-05-07 MED ORDER — ACETAMINOPHEN 500 MG PO TABS
1000.0000 mg | ORAL_TABLET | ORAL | Status: AC
  Administered 2024-05-07: 1000 mg via ORAL

## 2024-05-07 MED ORDER — OXYCODONE HCL 5 MG/5ML PO SOLN: 5.0000 mg | Freq: Once | ORAL | Status: AC | PRN

## 2024-05-07 MED ORDER — GLYCOPYRROLATE 0.2 MG/ML IJ SOLN
INTRAMUSCULAR | Status: AC
  Filled 2024-05-07: qty 1

## 2024-05-07 MED ORDER — SUGAMMADEX SODIUM 200 MG/2ML IV SOLN
INTRAVENOUS | Status: DC | PRN
  Administered 2024-05-07: 200 mg via INTRAVENOUS

## 2024-05-07 MED ORDER — 0.9 % SODIUM CHLORIDE (POUR BTL) OPTIME
TOPICAL | Status: DC | PRN
Start: 2024-05-07 — End: 2024-05-07
  Administered 2024-05-07: 1000 mL

## 2024-05-07 MED ORDER — DEXAMETHASONE SODIUM PHOSPHATE 10 MG/ML IJ SOLN
INTRAMUSCULAR | Status: AC
  Filled 2024-05-07: qty 1

## 2024-05-07 MED ORDER — FENTANYL CITRATE (PF) 250 MCG/5ML IJ SOLN
INTRAMUSCULAR | Status: DC | PRN
  Administered 2024-05-07: 50 ug via INTRAVENOUS
  Administered 2024-05-07: 25 ug via INTRAVENOUS
  Administered 2024-05-07 (×2): 50 ug via INTRAVENOUS
  Administered 2024-05-07: 25 ug via INTRAVENOUS
  Administered 2024-05-07: 50 ug via INTRAVENOUS

## 2024-05-07 MED ORDER — BUPIVACAINE-EPINEPHRINE 0.25% -1:200000 IJ SOLN
INTRAMUSCULAR | Status: DC | PRN
  Administered 2024-05-07: 20 mL

## 2024-05-07 MED ORDER — ONDANSETRON HCL 4 MG/2ML IJ SOLN
INTRAMUSCULAR | Status: DC | PRN
  Administered 2024-05-07: 4 mg via INTRAVENOUS

## 2024-05-07 MED ORDER — KETAMINE HCL 50 MG/5ML IJ SOSY
PREFILLED_SYRINGE | INTRAMUSCULAR | Status: DC | PRN
  Administered 2024-05-07: 30 mg via INTRAVENOUS

## 2024-05-07 MED ORDER — OXYCODONE HCL 5 MG PO TABS
ORAL_TABLET | ORAL | Status: AC
  Filled 2024-05-07: qty 1

## 2024-05-07 MED ORDER — EPHEDRINE SULFATE-NACL 50-0.9 MG/10ML-% IV SOSY
PREFILLED_SYRINGE | INTRAVENOUS | Status: DC | PRN
  Administered 2024-05-07 (×4): 5 mg via INTRAVENOUS

## 2024-05-07 MED ORDER — MIDAZOLAM HCL 2 MG/2ML IJ SOLN
INTRAMUSCULAR | Status: AC
Start: 2024-05-07 — End: 2024-05-07
  Filled 2024-05-07: qty 2

## 2024-05-07 MED ORDER — SPY AGENT GREEN - (INDOCYANINE FOR INJECTION)
1.2500 mg | Freq: Once | INTRAMUSCULAR | Status: AC
  Administered 2024-05-07: 1.25 mg via INTRAVENOUS
  Filled 2024-05-07: qty 10

## 2024-05-07 MED ORDER — KETAMINE HCL 50 MG/5ML IJ SOSY
PREFILLED_SYRINGE | INTRAMUSCULAR | Status: AC
Start: 2024-05-07 — End: 2024-05-07
  Filled 2024-05-07: qty 5

## 2024-05-07 MED ORDER — INSULIN ASPART 100 UNIT/ML IJ SOLN: 0.0000 [IU] | INTRAMUSCULAR | Status: DC | PRN

## 2024-05-07 MED ORDER — ROCURONIUM BROMIDE 10 MG/ML (PF) SYRINGE
PREFILLED_SYRINGE | INTRAVENOUS | Status: DC | PRN
  Administered 2024-05-07: 60 mg via INTRAVENOUS

## 2024-05-07 MED ORDER — PHENYLEPHRINE 80 MCG/ML (10ML) SYRINGE FOR IV PUSH (FOR BLOOD PRESSURE SUPPORT)
PREFILLED_SYRINGE | INTRAVENOUS | Status: AC
  Filled 2024-05-07: qty 10

## 2024-05-07 MED ORDER — LACTATED RINGERS IR SOLN
Status: DC | PRN
Start: 2024-05-07 — End: 2024-05-07
  Administered 2024-05-07: 1000 mL

## 2024-05-07 MED ORDER — FENTANYL CITRATE (PF) 250 MCG/5ML IJ SOLN
INTRAMUSCULAR | Status: AC
  Filled 2024-05-07: qty 5

## 2024-05-07 MED ORDER — EPHEDRINE 5 MG/ML INJ
INTRAVENOUS | Status: AC
  Filled 2024-05-07: qty 5

## 2024-05-07 MED ORDER — LIDOCAINE HCL (PF) 2 % IJ SOLN
INTRAMUSCULAR | Status: AC
Start: 2024-05-07 — End: 2024-05-07
  Filled 2024-05-07: qty 5

## 2024-05-07 SURGICAL SUPPLY — 45 items
APPLICATOR ARISTA FLEXITIP XL (MISCELLANEOUS) IMPLANT
BAG COUNTER SPONGE SURGICOUNT (BAG) IMPLANT
CABLE HIGH FREQUENCY MONO STRZ (ELECTRODE) ×1 IMPLANT
CHLORAPREP W/TINT 26 (MISCELLANEOUS) ×1 IMPLANT
CLIP APPLIE 5 13 M/L LIGAMAX5 (MISCELLANEOUS) IMPLANT
CLIP APPLIE ROT 10 11.4 M/L (STAPLE) IMPLANT
CLIP LIGATING HEMO O LOK GREEN (MISCELLANEOUS) IMPLANT
CLSR STERI-STRIP ANTIMIC 1/2X4 (GAUZE/BANDAGES/DRESSINGS) IMPLANT
COVER MAYO STAND XLG (MISCELLANEOUS) IMPLANT
COVER SURGICAL LIGHT HANDLE (MISCELLANEOUS) ×1 IMPLANT
DERMABOND ADVANCED .7 DNX12 (GAUZE/BANDAGES/DRESSINGS) IMPLANT
DRAPE C-ARM 42X120 X-RAY (DRAPES) IMPLANT
DRSG TEGADERM 2-3/8X2-3/4 SM (GAUZE/BANDAGES/DRESSINGS) ×3 IMPLANT
DRSG TEGADERM 4X4.75 (GAUZE/BANDAGES/DRESSINGS) ×1 IMPLANT
ELECT REM PT RETURN 15FT ADLT (MISCELLANEOUS) ×1 IMPLANT
GAUZE SPONGE 2X2 8PLY STRL LF (GAUZE/BANDAGES/DRESSINGS) ×1 IMPLANT
GLOVE BIO SURGEON STRL SZ7.5 (GLOVE) ×1 IMPLANT
GLOVE INDICATOR 8.0 STRL GRN (GLOVE) ×1 IMPLANT
GOWN STRL REUS W/ TWL XL LVL3 (GOWN DISPOSABLE) ×1 IMPLANT
GRASPER SUT TROCAR 14GX15 (MISCELLANEOUS) IMPLANT
HEMOSTAT ARISTA ABSORB 3G PWDR (HEMOSTASIS) IMPLANT
HEMOSTAT SNOW SURGICEL 2X4 (HEMOSTASIS) IMPLANT
IRRIGATION SUCT STRKRFLW 2 WTP (MISCELLANEOUS) ×1 IMPLANT
KIT BASIN OR (CUSTOM PROCEDURE TRAY) ×1 IMPLANT
KIT TURNOVER KIT A (KITS) ×1 IMPLANT
LHOOK LAP DISP 36CM (ELECTROSURGICAL) IMPLANT
POUCH RETRIEVAL ECOSAC 10 (ENDOMECHANICALS) ×1 IMPLANT
SCISSORS LAP 5X35 DISP (ENDOMECHANICALS) ×1 IMPLANT
SET CHOLANGIOGRAPH MIX (MISCELLANEOUS) IMPLANT
SET TUBE SMOKE EVAC HIGH FLOW (TUBING) ×1 IMPLANT
SLEEVE ADV FIXATION 5X100MM (TROCAR) ×1 IMPLANT
SPIKE FLUID TRANSFER (MISCELLANEOUS) ×1 IMPLANT
STRIP CLOSURE SKIN 1/2X4 (GAUZE/BANDAGES/DRESSINGS) ×1 IMPLANT
SUT MNCRL AB 4-0 PS2 18 (SUTURE) ×1 IMPLANT
SUT NOVA 0 T19/GS 22DT (SUTURE) IMPLANT
SUT VIC AB 0 UR5 27 (SUTURE) IMPLANT
SUT VIC AB 3-0 SH 18 (SUTURE) IMPLANT
SUT VICRYL 0 TIES 12 18 (SUTURE) IMPLANT
SUT VICRYL 0 UR6 27IN ABS (SUTURE) IMPLANT
TOWEL OR 17X26 10 PK STRL BLUE (TOWEL DISPOSABLE) ×1 IMPLANT
TRAY LAPAROSCOPIC (CUSTOM PROCEDURE TRAY) ×1 IMPLANT
TROCAR ADV FIXATION 12X100MM (TROCAR) IMPLANT
TROCAR ADV FIXATION 5X100MM (TROCAR) ×1 IMPLANT
TROCAR BALLN 12MMX100 BLUNT (TROCAR) IMPLANT
TROCAR XCEL NON-BLD 5MMX100MML (ENDOMECHANICALS) IMPLANT

## 2024-05-07 NOTE — Anesthesia Postprocedure Evaluation (Signed)
 Anesthesia Post Note  Patient: Dwayne Larsen  Procedure(s) Performed: LAPAROSCOPIC CHOLECYSTECTOMY (Abdomen) REPAIR, HERNIA, UMBILICAL, ADULT (Abdomen)     Patient location during evaluation: PACU Anesthesia Type: General Level of consciousness: awake and alert Pain management: pain level controlled Vital Signs Assessment: post-procedure vital signs reviewed and stable Respiratory status: spontaneous breathing, nonlabored ventilation, respiratory function stable and patient connected to nasal cannula oxygen Cardiovascular status: blood pressure returned to baseline and stable Postop Assessment: no apparent nausea or vomiting Anesthetic complications: no   No notable events documented.  Last Vitals:  Vitals:   05/07/24 1045 05/07/24 1102  BP: (!) 143/65 (!) 165/75  Pulse: 73 (!) 58  Resp: 16 19  Temp: (!) 36.4 C 36.5 C  SpO2: 96% 97%    Last Pain:  Vitals:   05/07/24 1102  TempSrc:   PainSc: 4                  Thom JONELLE Peoples

## 2024-05-07 NOTE — Discharge Instructions (Signed)
 LAPAROSCOPIC SURGERY: POST OP INSTRUCTIONS Always review your discharge instruction sheet given to you by the facility where your surgery was performed. IF YOU HAVE DISABILITY OR FAMILY LEAVE FORMS, YOU MUST BRING THEM TO THE OFFICE FOR PROCESSING.   DO NOT GIVE THEM TO YOUR DOCTOR.  PAIN CONTROL  First take acetaminophen  (Tylenol ) AND/or ibuprofen (Advil) to control your pain after surgery.  Follow directions on package.  Taking acetaminophen  (Tylenol ) and/or ibuprofen (Advil) regularly after surgery will help to control your pain and lower the amount of prescription pain medication you may need.  You should not take more than 3,000 mg (3 grams) of acetaminophen  (Tylenol ) in 24 hours.  You should not take ibuprofen (Advil), aleve, motrin, naprosyn or other NSAIDS if you have a history of stomach ulcers or chronic kidney disease.  A prescription for pain medication may be given to you upon discharge.  Take your pain medication as prescribed, if you still have uncontrolled pain after taking acetaminophen  (Tylenol ) or ibuprofen (Advil). Use ice packs to help control pain. If you need a refill on your pain medication, please contact your pharmacy.  They will contact our office to request authorization. Prescriptions will not be filled after 5pm or on week-ends.  HOME MEDICATIONS Take your usually prescribed medications unless otherwise directed.  DIET You should follow a light diet the first few days after arrival home.  Be sure to include lots of fluids daily. Avoid fatty, fried foods.   CONSTIPATION It is common to experience some constipation after surgery and if you are taking pain medication.  Increasing fluid intake and taking a stool softener (such as Colace) will usually help or prevent this problem from occurring.  A mild laxative (Milk of Magnesia or Miralax) should be taken according to package instructions if there are no bowel movements after 48 hours.  WOUND/INCISION CARE Most  patients will experience some swelling and bruising in the area of the incisions.  Ice packs will help.  Swelling and bruising can take several days to resolve.  Unless discharge instructions indicate otherwise, follow guidelines below  STERI-STRIPS - you may remove your outer bandages 48 hours after surgery, and you may shower at that time.  You have steri-strips (small skin tapes) in place directly over the incision.  These strips should be left on the skin for 7-10 days.   DERMABOND/SKIN GLUE - you may shower in 24 hours.  The glue will flake off over the next 2-3 weeks. Any sutures or staples will be removed at the office during your follow-up visit.  ACTIVITIES You may resume regular (light) daily activities beginning the next day--such as daily self-care, walking, climbing stairs--gradually increasing activities as tolerated.  You may have sexual intercourse when it is comfortable.  Refrain from any heavy lifting or straining until approved by your doctor. You may drive when you are no longer taking prescription pain medication, you can comfortably wear a seatbelt, and you can safely maneuver your car and apply brakes.  FOLLOW-UP You should see your doctor in the office for a follow-up appointment approximately 2-3 weeks after your surgery.  You should have been given your post-op/follow-up appointment when your surgery was scheduled.  If you did not receive a post-op/follow-up appointment, make sure that you call for this appointment within a day or two after you arrive home to insure a convenient appointment time.  OTHER INSTRUCTIONS Do not lift/push/pull anything greater than 10lbs for 1 month  WHEN TO CALL YOUR DOCTOR: Fever over 101.0  Inability to urinate Continued bleeding from incision. Increased pain, redness, or drainage from the incision. Increasing abdominal pain  The clinic staff is available to answer your questions during regular business hours.  Please don't hesitate to  call and ask to speak to one of the nurses for clinical concerns.  If you have a medical emergency, go to the nearest emergency room or call 911.  A surgeon from [facility] is always on call at the hospital. [address], [place], [place]  [address] ? [address] [address], [place], [place]   [address] [phone] ? [phone] ? FAX [phone] Web site: [email or web address]

## 2024-05-07 NOTE — Op Note (Signed)
 Dwayne Larsen [id number] 02-25-1956 05/07/2024  Laparoscopic Cholecystectomy with near infrared fluorescent cholangiography with open primary repair of umbilical hernia (1.5 cm ) procedure Note  Indications: This patient presents with history of biliary colic.  He has known gallstones and a low gallbladder ejection fraction on nuclear medicine imaging.  He was initially referred for umbilical hernia repair but during review of his symptoms it felt that most of his symptoms were probably biliary in nature.  Since he has a pre-existing umbilical hernia we talked about doing laparoscopic cholecystectomy using his known umbilical hernia which is about 1-1/2 cm on physical exam and will undergo laparoscopic cholecystectomy.  Pre-operative Diagnosis: Biliary colic, 1.5 cm umbilical hernia  Post-operative Diagnosis: Same  Surgeon: Camellia Blush MD FACS  Assistants: Circulator: Leonce Richardson Saa, RN Scrub Person: Gretta Leonor CROME, RN; Bonny Hunter HERO, [place]   Anesthesia: General endotracheal anesthesia  Procedure Details  The patient was seen again in the Holding Room. The risks, benefits, complications, treatment options, and expected outcomes were discussed with the patient. The possibilities of reaction to medication, pulmonary aspiration, perforation of viscus, bleeding, recurrent infection, finding a normal gallbladder, the need for additional procedures, failure to diagnose a condition, the possible need to convert to an open procedure, and creating a complication requiring transfusion or operation were discussed with the patient. The likelihood of improving the patient's symptoms with return to their baseline status is good.  The patient and/or family concurred with the proposed plan, giving informed consent. The site of surgery properly noted. The patient was taken to Operating Room, identified as [name] and the procedure verified as Laparoscopic Cholecystectomy with ICG dye.  A Time Out was held  and the above information confirmed. Antibiotic prophylaxis was administered.    ICG dye was administered preoperatively.    General endotracheal anesthesia was then administered and tolerated well. After the induction, the abdomen was prepped with Chloraprep and draped in the sterile fashion. The patient was positioned in the supine position.   A curvilinear infraumbilical incision was created. Dissection was carried down to the hernia sac located above the fascia and was mobilized from surrounding structures. Intact fascia was identified circumferentially around the defect. Skin and soft tissue was mobilized from the surface of the fascia in a circumferential manner. A finger sweep was performed underneath the fascia to ensure there were no adhesions to the anterior abdominal wall.    A pursestring suture of 0-Vicryl was placed around the fascial opening.  The Hasson cannula was inserted and secured with the stay suture.  Pneumoperitoneum was then created with CO2 and tolerated well without any adverse changes in the patient's vital signs. An 5-mm port was placed in the subxiphoid position.  Two 5-mm ports were placed in the right upper quadrant. All skin incisions were infiltrated with a local anesthetic agent before making the incision and placing the trocars.   We positioned the patient in reverse Trendelenburg, tilted slightly to the patient's left.  The gallbladder was identified.  It was distended. the fundus was grasped and retracted cephalad. Adhesions were lysed bluntly and with the electrocautery where indicated, taking care not to injure any adjacent organs or viscus. The infundibulum was grasped and retracted laterally, exposing the peritoneum overlying the triangle of Calot. This was then divided and exposed in a blunt fashion. A critical view of the cystic duct and cystic artery was obtained.  The cystic duct was clearly identified and bluntly dissected circumferentially.  Utilizing the  [employer]  camera system near infrared fluorescent activity was visualized in the liver, cystic duct, common hepatic duct and common bile duct and small bowel.  This served as a secondary confirmation of our anatomy.  The cystic duct was then ligated with 3 clips on the patient's side and 1 distally and divided. The cystic artery which had been identified & dissected free was ligated with clips and divided as well.   The gallbladder was dissected from the liver bed in retrograde fashion with the electrocautery.  There was some spillage of bile from the gallbladder but no stone spillage.  The gallbladder was removed and placed in an Ecco sac.  The gallbladder and Ecco sac were then removed through the umbilical port site. The liver bed was irrigated and inspected. Hemostasis was achieved with the electrocautery. Copious irrigation was utilized and was repeatedly aspirated until clear.     The fascia was then closed primarily with 5 interrupted 0-novafil sutures. The cavity was irrigated and additional local was infiltrated in the subcutaneous tissue and fascia.  I then returned laparoscopically and inspected for closure.  There was nothing within the closure.  I did place an additional interrupted 0 Novofil using PMI suture passer on the right end of the umbilical closure just because that area seemed a little bit weak. We again inspected the right upper quadrant for hemostasis. Pneumoperitoneum was released as we removed the trocars.The umbilical stalk was then tacked back down to the fascia with a 3-0 vicryl sutures. Hemostasis was confirmed. The soft tissue was irrigated and closed in layers with inverted interrupted 3-0 vicryl sutures for the deep dermis.     4-0 Monocryl was used to close the skin.   Steri-Strips, gauze and Tegaderms were applied the patient was then extubated and brought to the recovery room in stable condition. Instrument, sponge, and needle counts were correct at closure and at the  conclusion of the case.   Findings: Distended gallbladder with stone critical view Infrared fluorescent cholangiography visualized within the common hepatic duct, common bile duct, cystic duct and small bowel 1.5 cm umbilical hernia  Estimated Blood Loss: Minimal         Drains: none         Specimens: Gallbladder           Complications: None; patient tolerated the procedure well.         Disposition: PACU - hemodynamically stable.         Condition: stable  Camellia HERO. Tanda, MD, FACS General, Bariatric, & Minimally Invasive Surgery [facility],  [facility]

## 2024-05-07 NOTE — H&P (Addendum)
 CC: here for surgery  Requesting provider: n/a  HPI: Dwayne Larsen is an 69 y.o. male who is here for laparoscopic cholecystectomy with umbilical hernia repair.  Reports he stopped his blood thinner about a week ago  Seen in clinic on 7/10.   He has noticed a bulge at his umbilicus, resembling a 'turkey's pop-up timer,' present for several months and gradually increasing in size. It is uncomfortable but not painful, and feels hard, like a cork.  He has a history of prostate surgery, specifically a transurethral resection of the prostate (TURP), and takes Eliquis  for a heart stent. No chest pain, chest pressure, or shortness of breath both at rest and with activity.  He has been seeing a gastroenterologist intermittently over the past few years for abdominal issues, and recalls being told he had a bacterial imbalance/SIBO. He was treated with medication, the name of which he cannot recall, and continues to take pantoprazole  for GERD. He experiences bloating, belching, and flatulence, with occasional crampy pain in the upper abdomen, particularly in the late afternoon and evening. The crampy pain is most intense around the umbilicus and lasts several hours. He has tried isolating food groups to identify triggers but has not found a consistent pattern.  He underwent a HIDA scan and ultrasound a few years ago, which revealed gallstones and a low-functioning gallbladder. He has had an upper endoscopy and colonoscopy, which did not show inflammatory bowel disease. No acid regurgitation.   Past Medical History:  Diagnosis Date   Abdominal pain, left lower quadrant    Allergy    Allergy, unspecified not elsewhere classified    BPH (benign prostatic hyperplasia)    CAD (coronary artery disease) 03/07/2014   3.0 x 18 mm Xience drug-eluting stent.  The stent was postdilated with a 3.25 noncompliant balloon.   DJD (degenerative joint disease)    Elevated prostate specific antigen (PSA)    Gout     History of kidney stones    Hypercholesteremia    Lumbar back pain    Palpitations    PONV (postoperative nausea and vomiting)    nausea    Past Surgical History:  Procedure Laterality Date   COLONOSCOPY     CYSTOSCOPY WITH LITHOLAPAXY N/A 01/13/2020   Procedure: CYSTOSCOPY WITH LITHOLAPAXY;  Surgeon: Sherrilee Belvie CROME, MD;  Location: [facility];  Service: Urology;  Laterality: N/A;   KNEE ARTHROSCOPY     right   LAMINECTOMY AND MICRODISCECTOMY LUMBAR SPINE  2003   L4-5   LEFT HEART CATHETERIZATION WITH CORONARY ANGIOGRAM N/A 03/07/2014   Procedure: LEFT HEART CATHETERIZATION WITH CORONARY ANGIOGRAM;  Surgeon: Deatrice DELENA Cage, MD;  Location: [facility] CATH LAB;  Service: Cardiovascular;  Laterality: N/A;   PERCUTANEOUS CORONARY STENT INTERVENTION (PCI-S)  03/07/2014   Procedure: PERCUTANEOUS CORONARY STENT INTERVENTION (PCI-S);  Surgeon: Deatrice DELENA Cage, MD;  Location: [facility] CATH LAB;  Service: Cardiovascular;;   RECTAL SURGERY  2003   Dr. Effie   TRANSURETHRAL RESECTION OF PROSTATE N/A 01/13/2020   Procedure: TRANSURETHRAL RESECTION OF THE PROSTATE (TURP);  Surgeon: Sherrilee Belvie CROME, MD;  Location: [facility];  Service: Urology;  Laterality: N/A;    Family History  Problem Relation Age of Onset   Diabetes Mother    Diverticulitis Mother    Coronary artery disease Father    Hyperlipidemia Father    Deep vein thrombosis Father    Prostate cancer Father    Colon cancer Neg Hx    Esophageal cancer  Neg Hx    Pancreatic cancer Neg Hx    Stomach cancer Neg Hx     Social:  reports that he quit smoking about 43 years ago. His smoking use included cigarettes. He started smoking about 53 years ago. He has a 10 pack-year smoking history. He has never used smokeless tobacco. He reports current alcohol  use. He reports that he does not use drugs.  Allergies:  Allergies  Allergen Reactions   Other Itching and Rash    Walnuts and pecans   Penicillins  Other (See Comments)    Did it involve swelling of the face/tongue/throat, SOB, or low BP?No Did it involve sudden or severe rash/hives, skin peeling, or any reaction on the inside of your mouth or nose? yes Did you need to seek medical attention at a hospital or doctor's office? no When did it last happen? 68yrs old      If all above answers are "NO", may proceed with cephalosporin use.     Medications: I have reviewed the patient's current medications.   ROS - all of the below systems have been reviewed with the patient and positives are indicated with bold text General: chills, fever or night sweats Eyes: blurry vision or double vision ENT: epistaxis or sore throat Allergy/Immunology: itchy/watery eyes or nasal congestion Hematologic/Lymphatic: bleeding problems, blood clots or swollen lymph nodes Endocrine: temperature intolerance or unexpected weight changes Breast: new or changing breast lumps or nipple discharge Resp: cough, shortness of breath, or wheezing CV: chest pain or dyspnea on exertion GI: as per HPI GU: dysuria, trouble voiding, or hematuria MSK: joint pain or joint stiffness Neuro: TIA or stroke symptoms Derm: pruritus and skin lesion changes Psych: anxiety and depression  PE Blood pressure (!) 152/84, pulse (!) 58, temperature 97.9 F (36.6 C), temperature source Oral, resp. rate 16, height 5' 8 (1.727 m), weight 79.8 kg, SpO2 98%. Constitutional: NAD; conversant; no deformities Eyes: Moist conjunctiva; no lid lag; anicteric; PERRL Neck: Trachea midline; no thyromegaly Lungs: Normal respiratory effort; no tactile fremitus CV: RRR; no palpable thrills; no pitting edema GI: Abd soft, nt, umbilical hernia; ; no palpable hepatosplenomegaly MSK: Normal gait; no clubbing/cyanosis Psychiatric: Appropriate affect; alert and oriented x3 Lymphatic: No palpable cervical or axillary lymphadenopathy Skin:no rash/lesion/jaundice  Results for orders placed or performed  during the hospital encounter of 05/07/24 (from the past 48 hours)  Glucose, capillary     Status: Abnormal   Collection Time: 05/07/24  7:08 AM  Result Value Ref Range   Glucose-Capillary 112 (H) 70 - 99 mg/dL    Comment: Glucose reference range applies only to samples taken after fasting for at least 8 hours.   Comment 1 Notify RN    Comment 2 Document in Chart     No results found.  Imaging: Gi office note 02/05/24  Echocardiogram 04/19/2023:  Normal LV systolic function with visual EF 55-60%. Left ventricle cavity  is normal in size. Normal global wall motion. Indeterminate diastolic  filling pattern, normal LAP. Mild left ventricular hypertrophy.  Left atrial cavity is mildly dilated at 35.9 ml/m^2.  No significant valvular heart disease.  Compared to 2015: LVEF improved from 50% to 55-60%, RWMA not appreciated  on current study, mild LAE is new.   MRI abd 2/23 IMPRESSION: 1. Hepatic steatosis. No suspicious hepatic mass identified. 2. Bilateral renal cysts. 3. Mildly distended fluid-filled distal esophagus, correlate for reflux.  RUQ u/s 09/2021 IMPRESSION: 1. Cholelithiasis. No additional sonographic evidence for acute cholecystitis. 2. Echogenic liver  likely related to fatty infiltration. 3. There is a new focal hypoechoic area in the region of the gallbladder fossa. Findings likely represent focal fatty sparing, but other etiologies can not be excluded. This can be further characterized with hepatic MRI.  HIDA with ef 07/2020 IMPRESSION: Normal hepatobiliary scan, demonstrating patency of cystic and common bile ducts.  Abnormal, decreased gallbladder ejection fraction of 5%.   A/P: Dwayne Larsen is an 68 y.o. male with  Umbilical hernia without obstruction and without gangrene  Gallstones  Dyspepsia  Abdominal bloating  Antiplatelet or antithrombotic long-term use   To OR laparoscopic cholecystectomy with umbilical hernia repair IV  antibiotic Enhanced recovery protocol Discussed dc instructions  Camellia HERO. Tanda, MD, FACS General, Bariatric, & Minimally Invasive Surgery [facility]

## 2024-05-07 NOTE — Transfer of Care (Signed)
 Immediate Anesthesia Transfer of Care Note  Patient: Dwayne Larsen  Procedure(s) Performed: LAPAROSCOPIC CHOLECYSTECTOMY (Abdomen) REPAIR, HERNIA, UMBILICAL, ADULT (Abdomen)  Patient Location: PACU  Anesthesia Type:General  Level of Consciousness: drowsy  Airway & Oxygen Therapy: Patient Spontanous Breathing and Patient connected to face mask oxygen  Post-op Assessment: Report given to RN and Post -op Vital signs reviewed and stable  Post vital signs: Reviewed and stable  Last Vitals:  Vitals Value Taken Time  BP 151/82 05/07/24 10:01  Temp    Pulse 58 05/07/24 10:03  Resp 10 05/07/24 10:03  SpO2 100 % 05/07/24 10:03  Vitals shown include unfiled device data.  Last Pain:  Vitals:   05/07/24 0705  TempSrc:   PainSc: 0-No pain         Complications: No notable events documented.

## 2024-05-07 NOTE — Anesthesia Procedure Notes (Signed)
 Procedure Name: Intubation Date/Time: 05/07/2024 8:28 AM  Performed by: Augusta Daved SAILOR, CRNAPre-anesthesia Checklist: Patient identified, Emergency Drugs available, Suction available and Patient being monitored Patient Re-evaluated:Patient Re-evaluated prior to induction Oxygen Delivery Method: Circle System Utilized Preoxygenation: Pre-oxygenation with 100% oxygen Induction Type: IV induction Ventilation: Mask ventilation without difficulty Laryngoscope Size: Miller and 2 Grade View: Grade II Tube type: Oral Tube size: 7.0 mm Number of attempts: 1 Airway Equipment and Method: Stylet and Oral airway Placement Confirmation: ETT inserted through vocal cords under direct vision, positive ETCO2 and breath sounds checked- equal and bilateral Secured at: 22.5 (at the lip) cm Tube secured with: Tape Dental Injury: Teeth and Oropharynx as per pre-operative assessment

## 2024-05-08 ENCOUNTER — Encounter (HOSPITAL_COMMUNITY): Payer: Self-pay | Admitting: General Surgery

## 2024-05-08 LAB — SURGICAL PATHOLOGY

## 2024-05-13 ENCOUNTER — Other Ambulatory Visit: Payer: Self-pay | Admitting: Cardiology

## 2024-05-13 DIAGNOSIS — I480 Paroxysmal atrial fibrillation: Secondary | ICD-10-CM

## 2024-05-27 ENCOUNTER — Other Ambulatory Visit: Payer: Self-pay | Admitting: Cardiology

## 2024-06-06 ENCOUNTER — Ambulatory Visit: Admitting: Cardiology

## 2024-06-16 ENCOUNTER — Encounter: Payer: Self-pay | Admitting: Cardiology

## 2024-06-16 ENCOUNTER — Ambulatory Visit: Attending: Cardiology | Admitting: Cardiology

## 2024-06-16 VITALS — BP 129/76 | HR 76 | Ht 68.0 in | Wt 176.2 lb

## 2024-06-16 DIAGNOSIS — I2510 Atherosclerotic heart disease of native coronary artery without angina pectoris: Secondary | ICD-10-CM

## 2024-06-16 DIAGNOSIS — E782 Mixed hyperlipidemia: Secondary | ICD-10-CM

## 2024-06-16 DIAGNOSIS — I480 Paroxysmal atrial fibrillation: Secondary | ICD-10-CM

## 2024-06-16 MED ORDER — APIXABAN 5 MG PO TABS: 5.0000 mg | ORAL_TABLET | Freq: Two times a day (BID) | ORAL | 3 refills | Status: AC

## 2024-06-16 NOTE — Progress Notes (Signed)
 Cardiology Office Note:  .   Date:  06/16/2024  ID:  Dwayne Larsen, DOB [birth year], MRN [id number] PCP: Onita Rush, MD  [facility] Providers Cardiologist:  Newman Lawrence, MD PCP: Onita Rush, MD  Chief Complaint  Patient presents with   Coronary Artery Disease     Dwayne Larsen is a 68 y.o. male with hypertension, hyperlipidemia, CAD s/p STEMI and RCA PCI 2015, paroxysmal atrial fibrillation.   Discussed the use of AI scribe software for clinical note transcription with the patient, who gave verbal consent to proceed.  History of Present Illness  Patient is doing well, denies any complaints of chest pain or shortness of breath.  He is compliant with his medical therapy.  He tells me that he has been that he is taking half tablet of some statin, but does not remember the dose.   Vitals:   06/16/24 1008  BP: 129/76  Pulse: 76  SpO2: 98%      Review of Systems  Cardiovascular:  Negative for chest pain, dyspnea on exertion, leg swelling, palpitations and syncope.        Studies Reviewed: SABRA        EKG 04/02/2024: Sinus rhythm 58 bpm Inferior infarct, age indeterminate  Echocardiogram 04/2023: Normal LV systolic function with visual EF 55-60%. Left ventricle cavity is normal in size. Normal global wall motion. Indeterminate diastolic filling pattern, normal LAP. Mild left ventricular hypertrophy. Left atrial cavity is mildly dilated at 35.9 ml/m^2. No significant valvular heart disease. Compared to 2015: LVEF improved from 50% to 55-60%, RWMA not appreciated on current study, mild LAE is new.   Labs 8.2025: Hb 14.8 Cr 1.44  08/2023: Chol 57, TG 80, HDL 27, LDL 14 HbA1C 6.7% TSH 2.5   Risk Assessment/Calculations:    CHA2DS2-VASc Score = 4  This indicates a 4.8% annual risk of stroke. The patient's score is based upon: CHF History: 1 HTN History: 1 Diabetes History: 0 Stroke History: 0 Vascular Disease History: 1 Age Score: 1 Gender  Score: 0    Physical Exam Vitals and nursing note reviewed.  Constitutional:      General: He is not in acute distress. Neck:     Vascular: No JVD.  Cardiovascular:     Rate and Rhythm: Normal rate and regular rhythm.     Heart sounds: Normal heart sounds. No murmur heard. Pulmonary:     Effort: Pulmonary effort is normal.     Breath sounds: Normal breath sounds. No wheezing or rales.  Musculoskeletal:     Right lower leg: No edema.     Left lower leg: No edema.      VISIT DIAGNOSES:   ICD-10-CM   1. Coronary artery disease involving native coronary artery of native heart without angina pectoris  I25.10 Lipid panel    2. Paroxysmal atrial fibrillation (HCC)  I48.0 apixaban  (ELIQUIS ) 5 MG TABS tablet    3. Mixed hyperlipidemia  E78.2        Dwayne Larsen is a 68 y.o. male with hypertension, hyperlipidemia, CAD s/p STEMI and RCA PCI 2015, paroxysmal atrial fibrillation.   Assessment & Plan  CAD: No angina symptoms.  Not on aspirin  due to ongoing use of anticoagulation for A-fib. Continue Repatha , check lipid panel. Okay to omit statin while on Repatha , as long as LDL is not >50  Paroxysmal A-fib: Maintaining sinus rhythm.  Emphasized importance of Eliquis  5 mg twice daily.  Continue metoprolol .  Hypertension: Controlled    Meds ordered  this encounter  Medications   apixaban  (ELIQUIS ) 5 MG TABS tablet    Sig: Take 1 tablet (5 mg total) by mouth 2 (two) times daily.    Dispense:  180 tablet    Refill:  3     F/u in 1 year w/APP, 2 years w/me  Signed, Newman JINNY Lawrence, MD

## 2024-06-16 NOTE — Patient Instructions (Addendum)
 Medication Instructions:  REFILLED Eliquis    STOP Atorvastatin    *If you need a refill on your cardiac medications before your next appointment, please call your pharmacy*  Lab Work: Lipid panel   If you have labs (blood work) drawn today and your tests are completely normal, you will receive your results only by: MyChart Message (if you have MyChart) OR A paper copy in the mail If you have any lab test that is abnormal or we need to change your treatment, we will call you to review the results.   Follow-Up: At [facility], you and your health needs are our priority.  As part of our continuing mission to provide you with exceptional heart care, our providers are all part of one team.  This team includes your primary Cardiologist (physician) and Advanced Practice Providers or APPs (Physician Assistants and Nurse Practitioners) who all work together to provide you with the care you need, when you need it.  Your next appointment:   1 year(s)  Provider:   One of our Advanced Practice Providers (APPs): Morse Clause, PA-C  Lamarr Satterfield, NP Miriam Shams, NP  Olivia Pavy, PA-C Josefa Beauvais, NP  Leontine Salen, PA-C Orren Fabry, PA-C  [place], PA-C Ernest Dick, NP  Damien Braver, NP Jon Hails, PA-C  Waddell Donath, PA-C    Dayna Dunn, PA-C  Scott Weaver, PA-C Lum Louis, NP Katlyn West, NP Callie Goodrich, PA-C  Xika Zhao, NP Sheng Haley, PA-C    Kathleen Johnson, PA-C   Then, Dr. Newman Lawrence will plan to see you again in 2 year(s).

## 2024-06-17 ENCOUNTER — Ambulatory Visit: Payer: Self-pay | Admitting: Cardiology

## 2024-06-17 DIAGNOSIS — E782 Mixed hyperlipidemia: Secondary | ICD-10-CM

## 2024-06-17 DIAGNOSIS — I2510 Atherosclerotic heart disease of native coronary artery without angina pectoris: Secondary | ICD-10-CM

## 2024-06-17 LAB — LIPID PANEL
Chol/HDL Ratio: 2.8 ratio (ref 0.0–5.0)
Cholesterol, Total: 66 mg/dL — ABNORMAL LOW (ref 100–199)
HDL: 24 mg/dL — ABNORMAL LOW (ref >39)
LDL Chol Calc (NIH): 21 mg/dL (ref 0–99)
Triglycerides: 108 mg/dL (ref 0–149)
VLDL Cholesterol Cal: 21 mg/dL (ref 5–40)

## 2024-06-18 ENCOUNTER — Other Ambulatory Visit: Payer: Self-pay | Admitting: Nurse Practitioner

## 2024-06-18 DIAGNOSIS — K080 Exfoliation of teeth due to systemic causes: Secondary | ICD-10-CM | POA: Diagnosis not present

## 2024-06-18 DIAGNOSIS — R140 Abdominal distension (gaseous): Secondary | ICD-10-CM

## 2024-06-18 DIAGNOSIS — R197 Diarrhea, unspecified: Secondary | ICD-10-CM

## 2024-06-19 MED ORDER — FENOFIBRATE 160 MG PO TABS: 160.0000 mg | ORAL_TABLET | Freq: Every day | ORAL | 3 refills | Status: AC

## 2024-06-21 ENCOUNTER — Other Ambulatory Visit (HOSPITAL_BASED_OUTPATIENT_CLINIC_OR_DEPARTMENT_OTHER): Payer: Self-pay

## 2024-06-21 MED ORDER — REPATHA SURECLICK 140 MG/ML ~~LOC~~ SOAJ
1.0000 mL | SUBCUTANEOUS | 4 refills | Status: DC
  Filled 2024-08-16: qty 2, 28d supply, fill #0

## 2024-06-21 MED ORDER — APIXABAN 5 MG PO TABS
5.0000 mg | ORAL_TABLET | Freq: Two times a day (BID) | ORAL | 3 refills | Status: AC
  Filled 2024-08-16: qty 180, 90d supply, fill #0

## 2024-06-21 MED ORDER — REPATHA SURECLICK 140 MG/ML ~~LOC~~ SOAJ: 1.0000 mL | SUBCUTANEOUS | 0 refills | Status: DC

## 2024-06-23 ENCOUNTER — Other Ambulatory Visit (HOSPITAL_BASED_OUTPATIENT_CLINIC_OR_DEPARTMENT_OTHER): Payer: Self-pay

## 2024-06-24 ENCOUNTER — Other Ambulatory Visit (HOSPITAL_BASED_OUTPATIENT_CLINIC_OR_DEPARTMENT_OTHER): Payer: Self-pay

## 2024-06-25 MED ORDER — PANTOPRAZOLE SODIUM 40 MG PO TBEC
40.0000 mg | DELAYED_RELEASE_TABLET | Freq: Every day | ORAL | 3 refills | Status: AC
  Filled 2024-06-27: qty 90, 90d supply, fill #0
  Filled 2024-09-30: qty 90, 90d supply, fill #1

## 2024-06-25 NOTE — Telephone Encounter (Signed)
 Patient requesting 90 day supply of pantoprazole  sent into [facility]. Please advise.

## 2024-06-25 NOTE — Addendum Note (Signed)
 Addended by: Apolo Cutshaw K on: 06/25/2024 03:48 PM   Modules accepted: Orders

## 2024-06-27 ENCOUNTER — Other Ambulatory Visit (HOSPITAL_COMMUNITY): Payer: Self-pay

## 2024-06-27 ENCOUNTER — Other Ambulatory Visit: Payer: Self-pay

## 2024-06-27 ENCOUNTER — Other Ambulatory Visit (HOSPITAL_BASED_OUTPATIENT_CLINIC_OR_DEPARTMENT_OTHER): Payer: Self-pay

## 2024-07-01 DIAGNOSIS — J209 Acute bronchitis, unspecified: Secondary | ICD-10-CM | POA: Diagnosis not present

## 2024-07-01 DIAGNOSIS — R058 Other specified cough: Secondary | ICD-10-CM | POA: Diagnosis not present

## 2024-07-02 DIAGNOSIS — K080 Exfoliation of teeth due to systemic causes: Secondary | ICD-10-CM | POA: Diagnosis not present

## 2024-07-31 DIAGNOSIS — K080 Exfoliation of teeth due to systemic causes: Secondary | ICD-10-CM | POA: Diagnosis not present

## 2024-08-05 DIAGNOSIS — K080 Exfoliation of teeth due to systemic causes: Secondary | ICD-10-CM | POA: Diagnosis not present

## 2024-08-16 ENCOUNTER — Other Ambulatory Visit (HOSPITAL_BASED_OUTPATIENT_CLINIC_OR_DEPARTMENT_OTHER): Payer: Self-pay

## 2024-08-16 ENCOUNTER — Other Ambulatory Visit (HOSPITAL_BASED_OUTPATIENT_CLINIC_OR_DEPARTMENT_OTHER): Payer: Self-pay | Admitting: Physician Assistant

## 2024-08-18 ENCOUNTER — Encounter (HOSPITAL_BASED_OUTPATIENT_CLINIC_OR_DEPARTMENT_OTHER): Payer: Self-pay

## 2024-08-18 ENCOUNTER — Other Ambulatory Visit: Payer: Self-pay

## 2024-08-18 ENCOUNTER — Other Ambulatory Visit (HOSPITAL_BASED_OUTPATIENT_CLINIC_OR_DEPARTMENT_OTHER): Payer: Self-pay

## 2024-08-18 MED ORDER — REPATHA SURECLICK 140 MG/ML ~~LOC~~ SOAJ
1.0000 mL | SUBCUTANEOUS | 3 refills | Status: AC
  Filled 2024-08-18: qty 6, 84d supply, fill #0

## 2024-08-20 ENCOUNTER — Other Ambulatory Visit (HOSPITAL_BASED_OUTPATIENT_CLINIC_OR_DEPARTMENT_OTHER): Payer: Self-pay

## 2024-08-26 DIAGNOSIS — Z1212 Encounter for screening for malignant neoplasm of rectum: Secondary | ICD-10-CM | POA: Diagnosis not present

## 2024-08-26 DIAGNOSIS — E7849 Other hyperlipidemia: Secondary | ICD-10-CM | POA: Diagnosis not present

## 2024-09-02 ENCOUNTER — Other Ambulatory Visit (HOSPITAL_BASED_OUTPATIENT_CLINIC_OR_DEPARTMENT_OTHER): Payer: Self-pay

## 2024-09-02 DIAGNOSIS — Z1212 Encounter for screening for malignant neoplasm of rectum: Secondary | ICD-10-CM | POA: Diagnosis not present

## 2024-09-02 DIAGNOSIS — R82998 Other abnormal findings in urine: Secondary | ICD-10-CM | POA: Diagnosis not present

## 2024-09-02 MED ORDER — METRONIDAZOLE 500 MG PO TABS
500.0000 mg | ORAL_TABLET | Freq: Three times a day (TID) | ORAL | 0 refills | Status: DC
  Filled 2024-09-02: qty 42, 14d supply, fill #0

## 2024-10-16 ENCOUNTER — Other Ambulatory Visit (HOSPITAL_BASED_OUTPATIENT_CLINIC_OR_DEPARTMENT_OTHER): Payer: Self-pay

## 2024-10-16 MED ORDER — METRONIDAZOLE 500 MG PO TABS
500.0000 mg | ORAL_TABLET | Freq: Three times a day (TID) | ORAL | 0 refills | Status: AC
  Filled 2024-10-16: qty 30, 10d supply, fill #0

## 2024-10-20 ENCOUNTER — Telehealth: Payer: Self-pay | Admitting: Cardiology
